# Patient Record
Sex: Female | Born: 1955
Health system: Southern US, Community
[De-identification: ages and names within clinical notes are randomized; demographics above are authoritative.]

## PROBLEM LIST (undated history)

## (undated) DIAGNOSIS — I10 Essential (primary) hypertension: Secondary | ICD-10-CM

## (undated) DIAGNOSIS — B029 Zoster without complications: Secondary | ICD-10-CM

## (undated) DIAGNOSIS — C4492 Squamous cell carcinoma of skin, unspecified: Secondary | ICD-10-CM

## (undated) DIAGNOSIS — Z87891 Personal history of nicotine dependence: Secondary | ICD-10-CM

## (undated) DIAGNOSIS — N809 Endometriosis, unspecified: Secondary | ICD-10-CM

## (undated) DIAGNOSIS — J029 Acute pharyngitis, unspecified: Secondary | ICD-10-CM

## (undated) DIAGNOSIS — M542 Cervicalgia: Secondary | ICD-10-CM

## (undated) DIAGNOSIS — Z862 Personal history of diseases of the blood and blood-forming organs and certain disorders involving the immune mechanism: Secondary | ICD-10-CM

## (undated) DIAGNOSIS — Z972 Presence of dental prosthetic device (complete) (partial): Secondary | ICD-10-CM

## (undated) DIAGNOSIS — M26609 Unspecified temporomandibular joint disorder, unspecified side: Secondary | ICD-10-CM

## (undated) DIAGNOSIS — G43909 Migraine, unspecified, not intractable, without status migrainosus: Secondary | ICD-10-CM

## (undated) DIAGNOSIS — R209 Unspecified disturbances of skin sensation: Secondary | ICD-10-CM

## (undated) DIAGNOSIS — J019 Acute sinusitis, unspecified: Secondary | ICD-10-CM

## (undated) HISTORY — DX: Unspecified temporomandibular joint disorder, unspecified side: M26.609

## (undated) HISTORY — DX: Cervicalgia: M54.2

## (undated) HISTORY — DX: Acute sinusitis, unspecified: J01.90

## (undated) HISTORY — PX: HERNIA REPAIR: SHX51

## (undated) HISTORY — PX: WISDOM TOOTH EXTRACTION: SHX21

## (undated) HISTORY — DX: Zoster without complications: B02.9

## (undated) HISTORY — DX: Acute pharyngitis, unspecified: J02.9

## (undated) HISTORY — PX: DILATION AND CURETTAGE OF UTERUS: SHX78

## (undated) HISTORY — DX: Personal history of nicotine dependence: Z87.891

## (undated) HISTORY — DX: Unspecified disturbances of skin sensation: R20.9

---

## 2007-10-22 ENCOUNTER — Emergency Department (HOSPITAL_COMMUNITY): Admission: EM | Admit: 2007-10-22 | Discharge: 2007-10-22 | Payer: Self-pay | Admitting: Emergency Medicine

## 2008-02-14 LAB — CONVERTED CEMR LAB: Pap Smear: NORMAL

## 2008-03-15 ENCOUNTER — Emergency Department (HOSPITAL_COMMUNITY): Admission: EM | Admit: 2008-03-15 | Discharge: 2008-03-15 | Payer: Self-pay | Admitting: Emergency Medicine

## 2008-07-28 ENCOUNTER — Emergency Department (HOSPITAL_COMMUNITY): Admission: EM | Admit: 2008-07-28 | Discharge: 2008-07-29 | Payer: Self-pay | Admitting: Emergency Medicine

## 2008-09-06 ENCOUNTER — Emergency Department (HOSPITAL_COMMUNITY): Admission: EM | Admit: 2008-09-06 | Discharge: 2008-09-06 | Payer: Self-pay | Admitting: Emergency Medicine

## 2008-10-15 ENCOUNTER — Emergency Department (HOSPITAL_COMMUNITY): Admission: EM | Admit: 2008-10-15 | Discharge: 2008-10-15 | Payer: Self-pay | Admitting: Emergency Medicine

## 2008-10-18 ENCOUNTER — Ambulatory Visit: Payer: Self-pay | Admitting: Family Medicine

## 2008-10-18 DIAGNOSIS — B029 Zoster without complications: Secondary | ICD-10-CM

## 2008-10-18 HISTORY — DX: Zoster without complications: B02.9

## 2008-10-29 ENCOUNTER — Ambulatory Visit: Payer: Self-pay | Admitting: Family Medicine

## 2008-10-29 LAB — CONVERTED CEMR LAB
Ketones, urine, test strip: NEGATIVE
Nitrite: NEGATIVE
Specific Gravity, Urine: 1.02

## 2008-10-30 LAB — CONVERTED CEMR LAB
Albumin: 3.7 g/dL (ref 3.5–5.2)
Basophils Absolute: 0 10*3/uL (ref 0.0–0.1)
CO2: 31 meq/L (ref 19–32)
Calcium: 9.3 mg/dL (ref 8.4–10.5)
Chloride: 109 meq/L (ref 96–112)
Eosinophils Absolute: 0.2 10*3/uL (ref 0.0–0.7)
Glucose, Bld: 90 mg/dL (ref 70–99)
HCT: 44.2 % (ref 36.0–46.0)
Hemoglobin: 15.2 g/dL — ABNORMAL HIGH (ref 12.0–15.0)
Lymphs Abs: 1.7 10*3/uL (ref 0.7–4.0)
MCHC: 34.3 g/dL (ref 30.0–36.0)
MCV: 90.2 fL (ref 78.0–100.0)
Monocytes Absolute: 0.5 10*3/uL (ref 0.1–1.0)
Neutro Abs: 4.9 10*3/uL (ref 1.4–7.7)
Potassium: 4.2 meq/L (ref 3.5–5.1)
RDW: 12.3 % (ref 11.5–14.6)
Sodium: 144 meq/L (ref 135–145)
TSH: 1.81 microintl units/mL (ref 0.35–5.50)
Total Protein: 6.8 g/dL (ref 6.0–8.3)
Triglycerides: 296 mg/dL — ABNORMAL HIGH (ref 0.0–149.0)

## 2008-11-01 ENCOUNTER — Ambulatory Visit: Payer: Self-pay | Admitting: Family Medicine

## 2008-11-08 ENCOUNTER — Encounter: Payer: Self-pay | Admitting: Gastroenterology

## 2009-02-11 DIAGNOSIS — M26609 Unspecified temporomandibular joint disorder, unspecified side: Secondary | ICD-10-CM | POA: Insufficient documentation

## 2009-02-11 HISTORY — DX: Unspecified temporomandibular joint disorder, unspecified side: M26.609

## 2009-02-12 ENCOUNTER — Telehealth: Payer: Self-pay | Admitting: Internal Medicine

## 2009-02-12 ENCOUNTER — Ambulatory Visit: Payer: Self-pay | Admitting: Family Medicine

## 2009-02-12 DIAGNOSIS — J029 Acute pharyngitis, unspecified: Secondary | ICD-10-CM | POA: Insufficient documentation

## 2009-02-12 HISTORY — DX: Acute pharyngitis, unspecified: J02.9

## 2009-02-15 ENCOUNTER — Ambulatory Visit: Payer: Self-pay | Admitting: Family Medicine

## 2009-02-15 DIAGNOSIS — M542 Cervicalgia: Secondary | ICD-10-CM

## 2009-02-15 HISTORY — DX: Cervicalgia: M54.2

## 2009-04-15 ENCOUNTER — Telehealth: Payer: Self-pay | Admitting: Family Medicine

## 2009-05-03 ENCOUNTER — Ambulatory Visit: Payer: Self-pay | Admitting: Family Medicine

## 2009-05-03 DIAGNOSIS — J019 Acute sinusitis, unspecified: Secondary | ICD-10-CM

## 2009-05-03 HISTORY — DX: Acute sinusitis, unspecified: J01.90

## 2009-08-28 ENCOUNTER — Ambulatory Visit: Payer: Self-pay | Admitting: Family Medicine

## 2009-10-27 ENCOUNTER — Emergency Department (HOSPITAL_COMMUNITY): Admission: EM | Admit: 2009-10-27 | Discharge: 2009-10-27 | Payer: Self-pay | Admitting: Emergency Medicine

## 2009-10-30 ENCOUNTER — Ambulatory Visit: Payer: Self-pay | Admitting: Family Medicine

## 2009-10-30 DIAGNOSIS — R209 Unspecified disturbances of skin sensation: Secondary | ICD-10-CM

## 2009-10-30 HISTORY — DX: Unspecified disturbances of skin sensation: R20.9

## 2010-03-10 ENCOUNTER — Ambulatory Visit: Payer: Self-pay | Admitting: Family Medicine

## 2010-03-10 ENCOUNTER — Encounter: Payer: Self-pay | Admitting: Family Medicine

## 2010-03-10 DIAGNOSIS — Z87891 Personal history of nicotine dependence: Secondary | ICD-10-CM | POA: Insufficient documentation

## 2010-03-10 HISTORY — DX: Personal history of nicotine dependence: Z87.891

## 2010-03-26 ENCOUNTER — Ambulatory Visit: Payer: Self-pay | Admitting: Family Medicine

## 2010-06-20 ENCOUNTER — Other Ambulatory Visit: Payer: Self-pay | Admitting: Family Medicine

## 2010-06-20 ENCOUNTER — Ambulatory Visit
Admission: RE | Admit: 2010-06-20 | Discharge: 2010-06-20 | Payer: Self-pay | Source: Home / Self Care | Attending: Family Medicine | Admitting: Family Medicine

## 2010-06-20 LAB — BASIC METABOLIC PANEL
BUN: 10 mg/dL (ref 6–23)
CO2: 29 mEq/L (ref 19–32)
Calcium: 9.2 mg/dL (ref 8.4–10.5)
Chloride: 105 mEq/L (ref 96–112)
Creatinine, Ser: 0.8 mg/dL (ref 0.4–1.2)
GFR: 85.9 mL/min (ref >60.00)
Glucose, Bld: 85 mg/dL (ref 70–99)
Potassium: 4.4 mEq/L (ref 3.5–5.1)
Sodium: 141 mEq/L (ref 135–145)

## 2010-06-20 LAB — CONVERTED CEMR LAB
Bilirubin Urine: NEGATIVE
Glucose, Urine, Semiquant: NEGATIVE
pH: 7

## 2010-06-20 LAB — CBC WITH DIFFERENTIAL/PLATELET
Basophils Absolute: 0 10*3/uL (ref 0.0–0.1)
Basophils Relative: 0.6 % (ref 0.0–3.0)
Eosinophils Absolute: 0.2 10*3/uL (ref 0.0–0.7)
Eosinophils Relative: 3 % (ref 0.0–5.0)
HCT: 45.9 % (ref 36.0–46.0)
Hemoglobin: 15.3 g/dL — ABNORMAL HIGH (ref 12.0–15.0)
Lymphocytes Relative: 21.9 % (ref 12.0–46.0)
Lymphs Abs: 1.8 10*3/uL (ref 0.7–4.0)
MCHC: 33.3 g/dL (ref 30.0–36.0)
MCV: 88.9 fl (ref 78.0–100.0)
Monocytes Absolute: 0.6 10*3/uL (ref 0.1–1.0)
Monocytes Relative: 7.1 % (ref 3.0–12.0)
Neutro Abs: 5.5 10*3/uL (ref 1.4–7.7)
Neutrophils Relative %: 67.4 % (ref 43.0–77.0)
Platelets: 241 10*3/uL (ref 150.0–400.0)
RBC: 5.16 Mil/uL — ABNORMAL HIGH (ref 3.87–5.11)
RDW: 13.5 % (ref 11.5–14.6)
WBC: 8.2 10*3/uL (ref 4.5–10.5)

## 2010-06-20 LAB — HEPATIC FUNCTION PANEL
ALT: 11 U/L (ref 0–35)
AST: 17 U/L (ref 0–37)
Albumin: 3.7 g/dL (ref 3.5–5.2)
Alkaline Phosphatase: 85 U/L (ref 39–117)
Bilirubin, Direct: 0.1 mg/dL (ref 0.0–0.3)
Total Bilirubin: 0.7 mg/dL (ref 0.3–1.2)
Total Protein: 6.6 g/dL (ref 6.0–8.3)

## 2010-06-20 LAB — TSH: TSH: 2.03 u[IU]/mL (ref 0.35–5.50)

## 2010-06-20 LAB — LIPID PANEL
Cholesterol: 205 mg/dL — ABNORMAL HIGH (ref 0–200)
HDL: 27.6 mg/dL — ABNORMAL LOW (ref >39.00)
Total CHOL/HDL Ratio: 7
Triglycerides: 184 mg/dL — ABNORMAL HIGH (ref 0.0–149.0)
VLDL: 36.8 mg/dL (ref 0.0–40.0)

## 2010-06-20 LAB — LDL CHOLESTEROL, DIRECT: Direct LDL: 147.9 mg/dL

## 2010-07-02 ENCOUNTER — Ambulatory Visit
Admission: RE | Admit: 2010-07-02 | Discharge: 2010-07-02 | Payer: Self-pay | Source: Home / Self Care | Attending: Family Medicine | Admitting: Family Medicine

## 2010-07-15 NOTE — Assessment & Plan Note (Signed)
Summary: ST, SECONDARY L FOOT PAIN // RS   Vital Signs:  Patient profile:   55 year old female Weight:      132 pounds Temp:     98.7 degrees F oral BP sitting:   100 / 80  (left arm) Cuff size:   regular  Vitals Entered By: Sid Falcon LPN (March 10, 2010 10:13 AM)  History of Present Illness: Patient here for the following issues  New issue of left great toe pain. This past Saturday she was walking through her garage and slipped and foot went underneath the refrigerator. She had some bruising and mild swelling mostly involving the great toe. No problems with ambulation. No ankle or leg pain.  Patient relates sudden onset this past Thursday around 11:15 AM of chest pain very localized left lower sternal border. Took some Prilosec without relief. No recent GERD symptoms. Pain is mild to moderate severity and constant. Denies any dyspnea, cough, fever, chills, hemoptysis, or pleuritic pain. No alleviating factors. No exacerbating factors. Not sore to touch.  No exertional symptoms.  Allergies: 1)  ! Gnp Iodine (Iodine)  Past History:  Past Medical History: Last updated: 10/18/2008 Anemia Frequent headaches  Family History: Last updated: 10/18/2008 Family History of Alcoholism/Addiction parent Family History of Arthritis parent Ovary cancer, mother Family History High cholesterol parent Diabetes, blood relative Family History of Cardiovascular disorder parent Stroke parent  Social History: Last updated: 10/18/2008 Occupation:  Manufacturing systems engineer Current Smoker Alcohol use-no  Risk Factors: Smoking Status: current (08/28/2009) PMH-FH-SH reviewed for relevance  Review of Systems  The patient denies anorexia, fever, weight loss, hoarseness, syncope, dyspnea on exertion, peripheral edema, prolonged cough, hemoptysis, abdominal pain, and severe indigestion/heartburn.    Physical Exam  General:  Well-developed,well-nourished,in no acute distress; alert,appropriate  and cooperative throughout examination Mouth:  Oral mucosa and oropharynx without lesions or exudates.  Teeth in good repair. Neck:  No deformities, masses, or tenderness noted. Chest Wall:  no reproducible tenderness to palpation. Lungs:  Normal respiratory effort, chest expands symmetrically. Lungs are clear to auscultation, no crackles or wheezes. Heart:  Normal rate and regular rhythm. S1 and S2 normal without gallop, murmur, click, rub or other extra sounds. Abdomen:  soft, non-tender, no distention, no masses, no guarding, no rigidity, no hepatomegaly, and no splenomegaly.   Extremities:  patient has ecchymosis left great toe just proximal to the nail. There is tenderness of the distal toe but no metatarsal tenderness. Skin:  no rashes.   Cervical Nodes:  No lymphadenopathy noted Psych:  normally interactive, good eye contact, not anxious appearing, and not depressed appearing.     Impression & Recommendations:  Problem # 1:  CHEST PAIN, ATYPICAL (ICD-786.59) Assessment New EKG no acute changes.  Atypical symptoms.  Observe for now and be in touch if symptoms persist or worsen. Orders: EKG w/ Interpretation (93000)  Problem # 2:  CONTUSION OF TOE (ICD-924.3) Assessment: New offered x-ray but explained if fx would not  change treatment. She will focus on more stiff-soled shoe.  Problem # 3:  PERS HX TOBACCO USE PRESENTING HAZARDS HEALTH (ICD-V15.82)  discussed smoking cessation but current motivation if fairly low.  3 minutes spent in counseling.  Orders: Tobacco use cessation intermediate 3-10 minutes (99406)  Complete Medication List: 1)  Imitrex 100 Mg Tabs (Sumatriptan succinate) .... 1/2 tab as needed  Patient Instructions: 1)  Use stiff sole shoe for toe comfort. 2)  Follow up promptly for any progressive or continued chest pain.

## 2010-07-15 NOTE — Assessment & Plan Note (Signed)
Summary: L LEG NUMBNESS (POST EVAL FROM [facility]) // RS   Vital Signs:  Patient profile:   55 year old female BP sitting:   122 / 82  (left arm) Cuff size:   regular  Vitals Entered By: Sid Falcon LPN (Oct 30, 2009 11:02 AM) CC: left leg numbness X 4 days   History of Present Illness: onset of numbness L leg Saturday.  No weakness.  No back pain or leg pain. Numbness involves lower 1/3 of leg and is diffuse.  Symptoms are continuous.  No exacerbating or alleviating factors. No upper extrem numbness.  No headaches. Frequntly sits with LLE crossed under her body.  Went to ER saturday.  No tests done.  Allergies: 1)  ! Gnp Iodine (Iodine)  Past History:  Past Medical History: Last updated: 10/18/2008 Anemia Frequent headaches  Family History: Last updated: 10/18/2008 Family History of Alcoholism/Addiction parent Family History of Arthritis parent Ovary cancer, mother Family History High cholesterol parent Diabetes, blood relative Family History of Cardiovascular disorder parent Stroke parent  Social History: Last updated: 10/18/2008 Occupation:  Manufacturing systems engineer Current Smoker Alcohol use-no PMH-FH-SH reviewed for relevance  Review of Systems  The patient denies anorexia, fever, weight loss, vision loss, chest pain, syncope, dyspnea on exertion, peripheral edema, headaches, abdominal pain, incontinence, muscle weakness, difficulty walking, and enlarged lymph nodes.    Physical Exam  General:  Well-developed,well-nourished,in no acute distress; alert,appropriate and cooperative throughout examination Eyes:  pupils equal, pupils round, and pupils reactive to light.   Neck:  no mass.  no bruits. Lungs:  Normal respiratory effort, chest expands symmetrically. Lungs are clear to auscultation, no crackles or wheezes. Heart:  normal rate, regular rhythm, and no gallop.   Extremities:  no edema.  Normal DP pulses.  Feet warm with good cap refill. Neurologic:  Full  strength plantar and dorsi flexion.   alert & oriented X3, cranial nerves II-XII intact, strength normal in all extremities, sensation intact to light touch, gait normal, DTRs symmetrical and normal, toes down bilaterally on Babinski, and Romberg negative.   Skin:  no rashes.     Impression & Recommendations:  Problem # 1:  PARESTHESIA (ICD-782.0) Assessment New Confined to leg.  ?peripheral nerve compression.  No other focal neurologic sxs.  Avoid compression on leg and consider nerve conduction studies if no resolution in 1-2 weeks.  Complete Medication List: 1)  Imitrex 100 Mg Tabs (Sumatriptan succinate) .... 1/2 tab as needed  Patient Instructions: 1)  Call to touch base in 2 weeks if numbness is not resolving. 2)  Call sooner if you have any back pain, weakness, or any progressive numbness.

## 2010-07-15 NOTE — Assessment & Plan Note (Signed)
Summary: SINUSES//CCM   Vital Signs:  Patient profile:   55 year old female Temp:     97.8 degrees F oral BP sitting:   102 / 72  (left arm) Cuff size:   regular  Vitals Entered By: Sid Falcon LPN (August 28, 2009 8:48 AM) CC: Sinusitis symptoms X 2-3 days   History of Present Illness: acute visit. One week history of increasing facial pain mostly maxillary sinus region. Intermittent mild headaches. Yellow-green nasal discharge. No fever. Intermittent sore throat. No cough. History of frequent sinusitis in the past. Tried DayQuil without improvement.  Preventive Screening-Counseling & Management  Alcohol-Tobacco     Smoking Status: current  Allergies: 1)  ! Gnp Iodine (Iodine)  Review of Systems      See HPI  Physical Exam  General:  Well-developed,well-nourished,in no acute distress; alert,appropriate and cooperative throughout examination Head:  Normocephalic and atraumatic without obvious abnormalities. No apparent alopecia or balding. Eyes:  pupils equal, pupils round, and pupils reactive to light.   Ears:  right ear canal impacted with cerumen left is clear Nose:  External nasal examination shows no deformity or inflammation. Nasal mucosa are pink and moist without lesions or exudates. Mouth:  Oral mucosa and oropharynx without lesions or exudates.  Teeth in good repair. Neck:  No deformities, masses, or tenderness noted. Lungs:  Normal respiratory effort, chest expands symmetrically. Lungs are clear to auscultation, no crackles or wheezes. Heart:  normal rate and regular rhythm.     Impression & Recommendations:  Problem # 1:  SINUSITIS, ACUTE (ICD-461.9)  The following medications were removed from the medication list:    Amoxicillin-pot Clavulanate 875-125 Mg Tabs (Amoxicillin-pot clavulanate) ..... One by mouth two times a day for 10 days    Hydrocodone-homatropine 5-1.5 Mg/67ml Syrp (Hydrocodone-homatropine) ..... One tsp by mouth q 4-6 hours as needed  cough Her updated medication list for this problem includes:    Amoxicillin 875 Mg Tabs (Amoxicillin) ..... One by mouth two times a day for 10 days  Complete Medication List: 1)  Imitrex 100 Mg Tabs (Sumatriptan succinate) .... 1/2 tab as needed 2)  Amoxicillin 875 Mg Tabs (Amoxicillin) .... One by mouth two times a day for 10 days  Patient Instructions: 1)  Acute sinusitis symptoms for less than 10 days are not helped by antibiotics. Use warm moist compresses, and over the counter decongestants( only as directed). Call if no improvement in 5-7 days, sooner if increasing pain, fever, or new symptoms.  Prescriptions: AMOXICILLIN 875 MG TABS (AMOXICILLIN) one by mouth two times a day for 10 days  #20 x 0   Entered and Authorized by:   [name] MD   Signed by:   [name] MD on 08/28/2009   Method used:   Electronically to        [facility] [phone]* (retail)       [address]       [place], [place]  [address]       Ph: [phone] or [phone]       Fax: [phone]   RxID:   [id number]

## 2010-07-15 NOTE — Assessment & Plan Note (Signed)
Summary: face swollen/njr   Vital Signs:  Patient profile:   55 year old female Weight:      133 pounds Temp:     98.1 degrees F oral BP sitting:   100 / 70  (left arm) Cuff size:   regular  Vitals Entered By: Sid Falcon LPN (March 26, 2010 11:51 AM)  History of Present Illness: Acute onset of right maxillary facial swelling yesterday. She denies any fever or chills. No significant sinus congestion. She has had frequent sinusitis the past. No facial weakness. Recently started by dentist on amoxicillin 500 mg twice daily but just started this 2 days ago. She does have a history of dental caries but no recent gum pain. Denies any cough. No visual symptoms.  Allergies: 1)  ! Gnp Iodine (Iodine)  Past History:  Past Medical History: Last updated: 10/18/2008 Anemia Frequent headaches  Review of Systems      See HPI  Physical Exam  General:  Well-developed,well-nourished,in no acute distress; alert,appropriate and cooperative throughout examination Head:  no evidence for nasolacrimal swelling on left or right Has some mild swelling over R max sinus region but no erythema or cellulitis changes.   Mildy tender to palpation. Eyes:  pupils equal, pupils round, and pupils reactive to light.   Ears:  minimal cerumen bilaterally otherwise unremarkable Nose:  External nasal examination shows no deformity or inflammation. Nasal mucosa are pink and moist without lesions or exudates. Mouth:  she has dental caries but no evidence for gum abscess Neck:  No deformities, masses, or tenderness noted. Lungs:  Normal respiratory effort, chest expands symmetrically. Lungs are clear to auscultation, no crackles or wheezes.   Impression & Recommendations:  Problem # 1:  SINUSITIS, ACUTE (ICD-461.9) Assessment New  Her updated medication list for this problem includes:    Amoxicillin-pot Clavulanate 875-125 Mg Tabs (Amoxicillin-pot clavulanate) ..... One by mouth two times a day for 10  days  Complete Medication List: 1)  Imitrex 100 Mg Tabs (Sumatriptan succinate) .... 1/2 tab as needed 2)  Amoxicillin-pot Clavulanate 875-125 Mg Tabs (Amoxicillin-pot clavulanate) .... One by mouth two times a day for 10 days  Patient Instructions: 1)  Consider addition of Mucinex 2 tablets twice daily 2)  Warm compresses to face several times daily Prescriptions: AMOXICILLIN-POT CLAVULANATE 875-125 MG TABS (AMOXICILLIN-POT CLAVULANATE) one by mouth two times a day for 10 days  #20 x 0   Entered and Authorized by:   [name] MD   Signed by:   [name] MD on 03/26/2010   Method used:   [facility]ically to        [facility] [phone][facility] (retail)       [address]       [place], [place]  [address]       Ph: [phone] or [phone]       Fax: [phone]   RxID:   [id number]

## 2010-07-17 NOTE — Assessment & Plan Note (Signed)
Summary: cpx/pap/cjr   Vital Signs:  Patient profile:   55 year old female Weight:      131 pounds BMI:     22.57 Temp:     97.8 degrees F oral BP sitting:   110 / 80  (right arm) Cuff size:   regular  Vitals Entered By: Duard Brady LPN (July 02, 2010 11:28 AM) CC: cpx - doing well Is Patient Diabetic? No   History of Present Illness: Here for CPE.    Still smoking and contemplating quitting but not willing to  commit yet. Tetanus up to date.  No hx of PVX.   Sees gyn for pap and mammogram. No hx of screening colonoscopy.  No regular exercise.  Clinical Review Panels:  Prevention   Last Mammogram:  normal (09/14/2006)   Last Pap Smear:  normal (02/14/2008)  Immunizations   Last Tetanus Booster:  Historical (09/13/2008)   Last Pneumovax:  Pneumovax (07/02/2010)  Lipid Management   Cholesterol:  205 (06/20/2010)   HDL (good cholesterol):  27.60 (06/20/2010)  Diabetes Management   Creatinine:  0.8 (06/20/2010)   Last Pneumovax:  Pneumovax (07/02/2010)  CBC   WBC:  8.2 (06/20/2010)   RBC:  5.16 (06/20/2010)   Hgb:  15.3 (06/20/2010)   Hct:  45.9 (06/20/2010)   Platelets:  241.0 (06/20/2010)   MCV  88.9 (06/20/2010)   MCHC  33.3 (06/20/2010)   RDW  13.5 (06/20/2010)   PMN:  67.4 (06/20/2010)   Lymphs:  21.9 (06/20/2010)   Monos:  7.1 (06/20/2010)   Eosinophils:  3.0 (06/20/2010)   Basophil:  0.6 (06/20/2010)  Complete Metabolic Panel   Glucose:  85 (06/20/2010)   Sodium:  141 (06/20/2010)   Potassium:  4.4 (06/20/2010)   Chloride:  105 (06/20/2010)   CO2:  29 (06/20/2010)   BUN:  10 (06/20/2010)   Creatinine:  0.8 (06/20/2010)   Albumin:  3.7 (06/20/2010)   Total Protein:  6.6 (06/20/2010)   Calcium:  9.2 (06/20/2010)   Total Bili:  0.7 (06/20/2010)   Alk Phos:  85 (06/20/2010)   SGPT (ALT):  11 (06/20/2010)   SGOT (AST):  17 (06/20/2010)   Allergies: 1)  ! Gnp Iodine (Iodine)  Past History:  Past Medical History: Last updated:  10/18/2008 Anemia Frequent headaches  Family History: Last updated: 07/02/2010 Family History of Alcoholism/Addiction parent Family History of Arthritis parent Esophageal cancer, mother Family History High cholesterol parent Stroke parent Father colon cancer  Social History: Last updated: 10/18/2008 Occupation:  Manufacturing systems engineer Current Smoker Alcohol use-no  Risk Factors: Smoking Status: current (08/28/2009) PMH-FH-SH reviewed for relevance  Family History: Family History of Alcoholism/Addiction parent Family History of Arthritis parent Esophageal cancer, mother Family History High cholesterol parent Stroke parent Father colon cancer  Review of Systems  The patient denies anorexia, fever, weight loss, vision loss, decreased hearing, hoarseness, chest pain, syncope, dyspnea on exertion, peripheral edema, prolonged cough, headaches, hemoptysis, abdominal pain, melena, hematochezia, severe indigestion/heartburn, hematuria, incontinence, genital sores, muscle weakness, suspicious skin lesions, transient blindness, difficulty walking, depression, unusual weight change, abnormal bleeding, enlarged lymph nodes, and breast masses.    Physical Exam  General:  Well-developed,well-nourished,in no acute distress; alert,appropriate and cooperative throughout examination Head:  normocephalic and atraumatic.   Eyes:  No corneal or conjunctival inflammation noted. EOMI. Perrla. Funduscopic exam benign, without hemorrhages, exudates or papilledema. Vision grossly normal. Ears:  mild cerumen bilateral. Mouth:  Oral mucosa and oropharynx without lesions or exudates.  Teeth in good repair. Neck:  No deformities,  masses, or tenderness noted. Breasts:  gyn Lungs:  Normal respiratory effort, chest expands symmetrically. Lungs are clear to auscultation, no crackles or wheezes. Heart:  Normal rate and regular rhythm. S1 and S2 normal without gallop, murmur, click, rub or other extra  sounds. Abdomen:  Bowel sounds positive,abdomen soft and non-tender without masses, organomegaly or hernias noted. Genitalia:  gyn Msk:  No deformity or scoliosis noted of thoracic or lumbar spine.   Extremities:  No clubbing, cyanosis, edema, or deformity noted with normal full range of motion of all joints.   Neurologic:  No cranial nerve deficits noted. Station and gait are normal. Plantar reflexes are down-going bilaterally. DTRs are symmetrical throughout. Sensory, motor and coordinative functions appear intact. Skin:  no rashes and no suspicious lesions.   Cervical Nodes:  No lymphadenopathy noted Psych:  Cognition and judgment appear intact. Alert and cooperative with normal attention span and concentration. No apparent delusions, illusions, hallucinations   Impression & Recommendations:  Problem # 1:  Preventive Health Care (ICD-V70.0) discussed smoking cessation.  PVX recommended and pt consents with RF of smoking. Labs reviewed.  Set up colonoscopy.  Discussed osteoporosis prevention.  Problem # 2:  PERS HX TOBACCO USE PRESENTING HAZARDS HEALTH (ICD-V15.82) discussed cessation and available nonrx and rx options.  She is not willing to commit at this time. Orders: Tobacco use cessation intermediate 3-10 minutes (99406)  Complete Medication List: 1)  Imitrex 100 Mg Tabs (Sumatriptan succinate) .... 1/2 tab as needed  Other Orders: Gastroenterology Referral (GI) Pneumococcal Vaccine (16109) Admin 1st Vaccine (60454)  Patient Instructions: 1)  Stop smoking tips: Choose a quit date. Cut down before the quit date. Decide what you will do as a substitute when you feel the urge to smoke(gum, toothpick, exercise).  2)  Schedule a colonoscopy/ sigmoidoscopy to help detect colon cancer-we will set up and call you with appt. 3)  Take calcium +vitamin D daily.    Orders Added: 1)  Gastroenterology Referral [GI] 2)  Est. Patient 40-64 years [99396] 3)  Pneumococcal Vaccine  [90732] 4)  Admin 1st Vaccine [90471] 5)  Tobacco use cessation intermediate 3-10 minutes [99406]   Immunizations Administered:  Pneumonia Vaccine:    Vaccine Type: Pneumovax    Site: left deltoid    Mfr: Merck    Dose: 0.5 ml    Route: IM    Given by: Duard Brady LPN    Exp. Date: 10/15/2011    Lot #: [id number]    VIS given: 05/20/09 version given July 02, 2010.    Physician counseled: yes   Immunizations Administered:  Pneumonia Vaccine:    Vaccine Type: Pneumovax    Site: left deltoid    Mfr: Merck    Dose: 0.5 ml    Route: IM    Given by: Duard Brady LPN    Exp. Date: 10/15/2011    Lot #: [id number]    VIS given: 05/20/09 version given July 02, 2010.    Physician counseled: yes

## 2011-01-18 ENCOUNTER — Other Ambulatory Visit: Payer: Self-pay | Admitting: Family Medicine

## 2011-02-20 ENCOUNTER — Ambulatory Visit (INDEPENDENT_AMBULATORY_CARE_PROVIDER_SITE_OTHER): Payer: Self-pay | Admitting: Family Medicine

## 2011-02-20 ENCOUNTER — Encounter: Payer: Self-pay | Admitting: Family Medicine

## 2011-02-20 VITALS — BP 108/70 | Temp 98.4°F | Ht 62.0 in | Wt 127.0 lb

## 2011-02-20 DIAGNOSIS — J329 Chronic sinusitis, unspecified: Secondary | ICD-10-CM

## 2011-02-20 MED ORDER — AMOXICILLIN 875 MG PO TABS: 875.0000 mg | ORAL_TABLET | Freq: Two times a day (BID) | ORAL | Status: AC

## 2011-02-20 NOTE — Progress Notes (Signed)
  Subjective:    Patient ID: Ann Lee, female    DOB: [birth year], 55 y.o.   MRN: [id number]  HPI Progressive sinus congestion and some colored nasal discharge past several days. Facial pain mostly left cheek region. History of frequent sinusitis the past. Rare cough. No fever or chills. Has generally responded well to amoxicillin in the past. Denies any earache. No nausea or vomiting.  Recent rash about one week ago right anterior chest medial and inferior to breast. Patient diagnosis is shingles. Drying up to this point. Minimal pain.   Review of Systems  Constitutional: Positive for fatigue. Negative for fever and chills.  HENT: Positive for congestion, postnasal drip and sinus pressure. Negative for sore throat.   Respiratory: Negative for cough and shortness of breath.   Cardiovascular: Negative for chest pain.  Skin: Positive for rash.       Objective:   Physical Exam  Constitutional: She appears well-developed and well-nourished.  HENT:  Right Ear: External ear normal.  Left Ear: External ear normal.       Erythematous nasal mucosa. Otherwise normal  Neck: Neck supple.  Cardiovascular: Normal rate, regular rhythm and normal heart sounds.   Pulmonary/Chest: Effort normal and breath sounds normal. No respiratory distress. She has no wheezes. She has no rales.  Lymphadenopathy:    She has no cervical adenopathy.  Skin:       Patient has a couple nonspecific small crusted papules medial right upper chest wall          Assessment & Plan:  #1 acute sinusitis. Amoxicillin 875 mg twice daily for 10 days #2 skin rash. Question resolving shingles. Reassurance given. No indication for antiviral as this started over one week ago

## 2011-02-20 NOTE — Patient Instructions (Signed)

## 2011-03-17 LAB — COMPREHENSIVE METABOLIC PANEL
AST: 17
Albumin: 3.6
BUN: 8
Chloride: 106
Creatinine, Ser: 0.8
GFR calc Af Amer: >60
Potassium: 4
Total Bilirubin: 0.6
Total Protein: 6.1

## 2011-03-17 LAB — CBC
MCV: 89.5
Platelets: 195
RDW: 13.3
WBC: 9.9

## 2011-03-17 LAB — DIFFERENTIAL
Basophils Absolute: 0
Eosinophils Relative: 2
Lymphocytes Relative: 7 — ABNORMAL LOW
Lymphs Abs: 0.7
Monocytes Absolute: 0.4
Monocytes Relative: 5
Neutro Abs: 8.6 — ABNORMAL HIGH

## 2011-06-24 ENCOUNTER — Ambulatory Visit (INDEPENDENT_AMBULATORY_CARE_PROVIDER_SITE_OTHER): Payer: Self-pay | Admitting: Family Medicine

## 2011-06-24 ENCOUNTER — Encounter: Payer: Self-pay | Admitting: Family Medicine

## 2011-06-24 VITALS — BP 140/84 | Temp 99.0°F | Wt 125.0 lb

## 2011-06-24 DIAGNOSIS — L258 Unspecified contact dermatitis due to other agents: Secondary | ICD-10-CM

## 2011-06-24 DIAGNOSIS — L853 Xerosis cutis: Secondary | ICD-10-CM

## 2011-06-24 DIAGNOSIS — L84 Corns and callosities: Secondary | ICD-10-CM

## 2011-06-24 MED ORDER — TRIAMCINOLONE ACETONIDE 0.1 % EX CREA: TOPICAL_CREAM | CUTANEOUS | Status: DC

## 2011-06-24 NOTE — Progress Notes (Signed)
  Subjective:    Patient ID: Ann Lee, female    DOB: [birth year], 56 y.o.   MRN: [id number]  HPI  Patient seen for the following issues  She has slightly pruritic rash mostly involving the lumbar and lower thoracic spine. She has very dry skin in this region and also forearms bilaterally. Has tried Benadryl spray and Lubriderm without much improvement. Itching especially bad at night. No alleviating factors. No aggravating factors.  Foot pain ventral aspect right foot on the ball of foot. She has callused area. Present for several months. Pain with weightbearing. No alleviating factors. No history of plantar warts.   Review of Systems  Constitutional: Negative for fever, chills, appetite change and unexpected weight change.  Respiratory: Negative for cough and shortness of breath.   Cardiovascular: Negative for chest pain.  Skin: Positive for rash.  Hematological: Negative for adenopathy.       Objective:   Physical Exam  Constitutional: She appears well-developed and well-nourished.  Cardiovascular: Normal rate and regular rhythm.   Pulmonary/Chest: Effort normal and breath sounds normal. No respiratory distress. She has no wheezes. She has no rales.  Musculoskeletal:       Patient has callus/corn ball right foot.  Skin:       Patient has generalized dryness involving especially forearms and lower back region in the midline. She has some pressure induced urticaria thoracic area from scratching. Couple nonspecific erythematous papules. No pustules. No vesicles.          Assessment & Plan:  #1 skin rash. Mostly dry skin. Triamcinolone 0.1% cream compounded with user and one-to-one and use twice daily. Discussed other measures to reduce dryness #2 corn/callus right foot. Trimmed with a #15 blade after discussing risk and benefits. She did have some immediate relief after trimming thickness of callus. She is aware this will likely recur

## 2011-07-03 ENCOUNTER — Telehealth: Payer: Self-pay | Admitting: Family Medicine

## 2011-07-03 MED ORDER — SUMATRIPTAN SUCCINATE 100 MG PO TABS: ORAL_TABLET | ORAL | Status: DC

## 2011-07-03 NOTE — Telephone Encounter (Signed)
Pt. Needs refill of SUMAtriptan called into pharmacy. Pharmacy told pt. Her prescription expired. Would like to have it called in asap due to holiday weekend. If this can't be done please call and let her know.

## 2011-10-12 ENCOUNTER — Ambulatory Visit (INDEPENDENT_AMBULATORY_CARE_PROVIDER_SITE_OTHER): Payer: BC Managed Care – PPO | Admitting: Family Medicine

## 2011-10-12 ENCOUNTER — Encounter: Payer: Self-pay | Admitting: Family Medicine

## 2011-10-12 VITALS — BP 130/84 | Temp 98.6°F | Wt 131.0 lb

## 2011-10-12 DIAGNOSIS — M542 Cervicalgia: Secondary | ICD-10-CM

## 2011-10-12 MED ORDER — CYCLOBENZAPRINE HCL 5 MG PO TABS: 5.0000 mg | ORAL_TABLET | Freq: Three times a day (TID) | ORAL | Status: AC | PRN

## 2011-10-12 NOTE — Progress Notes (Signed)
  Subjective:    Patient ID: Ann Lee, female    DOB: [birth year], 56 y.o.   MRN: [id number]  HPI  Acute visit. Onset 4 days ago of right-sided neck pain. Radiates right trapezius toward occiput. No injury. Pain is relatively constant but worse with the change of position. She tried Aleve and Advil without relief. He with temporary relief. Denies any numbness or weakness upper extremity. No radiculopathy symptoms. No history of similar process. Muscles feel tight.  Review of Systems  Constitutional: Negative for appetite change and unexpected weight change.  HENT: Positive for neck pain and neck stiffness (no meningismus).   Respiratory: Negative for cough and shortness of breath.   Cardiovascular: Negative for chest pain.       Objective:   Physical Exam  Constitutional: She appears well-developed and well-nourished.  Cardiovascular: Normal rate and regular rhythm.   Pulmonary/Chest: Effort normal and breath sounds normal. No respiratory distress. She has no wheezes. She has no rales.  Musculoskeletal:       Full range of motion neck. She has some right paracervical muscle tenderness and right trapezius muscle tenderness.  Neurological:       Full strength upper extremities. Symmetric reflexes. Normal sensory function.          Assessment & Plan:  Right-sided neck pain. Suspect muscular. Try Flexeril 5 mg each bedtime and titrate to 10 mg if no relief with one. Continue topical heat. Touch base next week if no better

## 2011-10-12 NOTE — Patient Instructions (Signed)
Continue with topical heat and be in touch if no better in one week.

## 2011-10-24 ENCOUNTER — Emergency Department (HOSPITAL_COMMUNITY)
Admission: EM | Admit: 2011-10-24 | Discharge: 2011-10-24 | Disposition: A | Discharge status: Home / Self Care | Payer: BC Managed Care – PPO | Attending: Emergency Medicine | Admitting: Emergency Medicine

## 2011-10-24 ENCOUNTER — Emergency Department (HOSPITAL_COMMUNITY): Payer: BC Managed Care – PPO

## 2011-10-24 ENCOUNTER — Encounter (HOSPITAL_COMMUNITY): Payer: Self-pay

## 2011-10-24 DIAGNOSIS — M79646 Pain in unspecified finger(s): Secondary | ICD-10-CM

## 2011-10-24 DIAGNOSIS — M79609 Pain in unspecified limb: Secondary | ICD-10-CM | POA: Insufficient documentation

## 2011-10-24 MED ORDER — IBUPROFEN 200 MG PO TABS
600.0000 mg | ORAL_TABLET | Freq: Once | ORAL | Status: AC
  Administered 2011-10-24: 600 mg via ORAL
  Filled 2011-10-24: qty 3

## 2011-10-24 MED ORDER — IBUPROFEN 600 MG PO TABS: 600.0000 mg | ORAL_TABLET | Freq: Three times a day (TID) | ORAL | Status: AC | PRN

## 2011-10-24 MED ORDER — HYDROCODONE-ACETAMINOPHEN 5-500 MG PO TABS: 1.0000 | ORAL_TABLET | Freq: Four times a day (QID) | ORAL | Status: AC | PRN

## 2011-10-24 NOTE — ED Notes (Signed)
Pt tore prescriptions from the paperwork that was given to her and refused to take them home.  Pt states "I don't want these prescriptions."  Pt left department ambulatory.

## 2011-10-24 NOTE — ED Notes (Signed)
Pt in from home with c/o right hand pain, redness, and swelling states radiates up  right arm states was sent in by PCP to r/o blood clot

## 2011-10-24 NOTE — ED Provider Notes (Signed)
History     CSN: [id number]  Arrival date & time 10/24/11  1204   First MD Initiated Contact with Patient 10/24/11 1222      Chief Complaint  Patient presents with  . Hand Pain    (Consider location/radiation/quality/duration/timing/severity/associated sxs/prior treatment) Patient is a 56 y.o. female presenting with hand pain. The history is provided by the patient.  Hand Pain  pt c/o sharp pain to right middle finger in past few days. States pain occasionally shoots to adjacent digits and proximally up finger towards hand. Worse w certain movements of middle finger and palpation finger. Denies recent injury, states crush injury to digits many years ago, but no chronic pain as a result. Denies arm pain or swelling. No fever or chills. No redness. No numbness/tingling. Normal rom. No hx carpal tunnel. No repetitive use injury.   Past Medical History  Diagnosis Date  . HERPES ZOSTER 10/18/2008  . SINUSITIS, ACUTE 05/03/2009  . SORE THROAT 02/12/2009  . TMJ SYNDROME 02/11/2009  . NECK PAIN 02/15/2009  . PARESTHESIA 10/30/2009  . PERS HX TOBACCO USE PRESENTING HAZARDS HEALTH 03/10/2010    History reviewed. No pertinent past surgical history.  Family History  Problem Relation Age of Onset  . Cancer Mother     esophageal  . Arthritis Mother   . Hyperlipidemia Father   . Arthritis Father   . Cancer Father     colon    History  Substance Use Topics  . Smoking status: Current Everyday Smoker -- 0.7 packs/day for 32 years    Types: Cigarettes  . Smokeless tobacco: Not on file  . Alcohol Use: No    OB History    Grav Para Term Preterm Abortions TAB SAB Ect Mult Living                  Review of Systems  Constitutional: Negative for fever and chills.  Skin: Negative for wound.  Neurological: Negative for weakness and numbness.    Allergies  Iodine and Shellfish allergy  Home Medications   Current Outpatient Rx  Name Route Sig Dispense Refill  . CYCLOBENZAPRINE HCL 5  MG PO TABS Oral Take 5 mg by mouth 3 (three) times daily as needed.    . SUMATRIPTAN SUCCINATE 100 MG PO TABS  Take 1/2 tab as needed for migraine headache 10 tablet 1    BP 128/78  Pulse 79  Temp(Src) 98.1 F (36.7 C) (Oral)  Resp 18  SpO2 98%  Physical Exam  Nursing note and vitals reviewed. Constitutional: She appears well-developed and well-nourished. No distress.  Eyes: Conjunctivae are normal. No scleral icterus.  Neck: Neck supple. No tracheal deviation present.  Cardiovascular: Normal rate.   Pulmonary/Chest: Effort normal. No respiratory distress.  Abdominal: Normal appearance. She exhibits no distension.  Musculoskeletal: She exhibits no edema.       Right middle finger very tender, although w some inconsistency. Pt able to actively and passively flex/extend finger comfortably. No swelling noted. Normal cap refill distally. Tendon fxn intact. No erythema, no wounds/lesions to skin. No paronychia or felon. No hand/arm or forearm swelling or tenderness. Radial pulse 2+.   Neurological: She is alert.       RUE/hand nvi.   Skin: Skin is warm and dry. No rash noted.  Psychiatric: She has a normal mood and affect.    ED Course  Procedures (including critical care time)     MDM  Pt c/o pain in right middle finger. Tenderness to area. Xray.  Reviewed prior charts/notes, and nursing notes.   Discussed diff dx w pt, as etiology not entirely apparent, will refer to close pcp and hand f/u.   Motrin po for pain (pt drove self).       Suzi Roots, MD 10/24/11 [phone]

## 2011-10-24 NOTE — Discharge Instructions (Signed)
Take motrin as need for pain.  You may also take vicodin as need for pain. No driving when taking vicodin. Also, do not take tylenol or acetaminophen containing medication when taking vicodin. Avoid repetitive use of or any trauma to the finger.  Follow up with hand specialist in the next few days if symptoms fail to improve/resolve - see referral - call Monday to arrange appointment. Return to ER if worse, severe swelling, spreading redness, fevers, skin rash/lesions, other concern.

## 2011-10-24 NOTE — ED Notes (Signed)
Denies recent injury to the right hand

## 2011-10-27 ENCOUNTER — Telehealth: Payer: Self-pay | Admitting: Family Medicine

## 2011-10-27 NOTE — Telephone Encounter (Signed)
[facility] Triage Call Report Triage Record Num: [id number] Operator: Hillary Bow Patient Name: Ann Lee Call Date & Time: 10/24/2011 9:57:25AM Patient Phone: [phone] PCP: [name] Patient Gender: Female PCP Fax : [phone] Patient DOB: Sep 21, 1955 [facility] Name[facility] Reason for Call: Caller: [place]kaylah/Patient; PCP: [name]; CB#: [phone]; Call regarding Right Ring Finger Swelling; Pt was seen for Neck Muscle Pain approx 1.5 weeks ago today Pt has sharp pain in R middle and ring Fingers w/ swelling, onset 5-11. Swelling is almost double in size. Advised Pt to be seen at [facility] or ED d/t new onset of painfull swelling worsen. [facility] office has no availablity on Sat remaining. Pt verbalized understanding. Protocol(s) Used: Hand Non-Injury Recommended Outcome per Protocol: See Provider within 4 hours Reason for Outcome: New painful tightness or marked swelling in palm, wrist, hand or fingers Care Advice: ~ 05/

## 2011-11-30 ENCOUNTER — Encounter: Payer: Self-pay | Admitting: Family Medicine

## 2011-11-30 ENCOUNTER — Ambulatory Visit (INDEPENDENT_AMBULATORY_CARE_PROVIDER_SITE_OTHER): Payer: BC Managed Care – PPO | Admitting: Family Medicine

## 2011-11-30 VITALS — BP 98/62 | Temp 102.3°F | Wt 127.0 lb

## 2011-11-30 DIAGNOSIS — J029 Acute pharyngitis, unspecified: Secondary | ICD-10-CM

## 2011-11-30 LAB — POCT RAPID STREP A (OFFICE): Rapid Strep A Screen: NEGATIVE

## 2011-11-30 MED ORDER — SUMATRIPTAN SUCCINATE 100 MG PO TABS: ORAL_TABLET | ORAL | Status: DC

## 2011-11-30 MED ORDER — AMOXICILLIN 875 MG PO TABS: 875.0000 mg | ORAL_TABLET | Freq: Two times a day (BID) | ORAL | Status: AC

## 2011-11-30 MED ORDER — AMOXICILLIN 875 MG PO TABS: 875.0000 mg | ORAL_TABLET | Freq: Two times a day (BID) | ORAL | Status: DC

## 2011-11-30 NOTE — Progress Notes (Signed)
  Subjective:    Patient ID: Ann Lee, female    DOB: 10/30/1955, 56 y.o.   MRN: [id number]  HPI  Acute visit. Onset this morning of fever, body aches, sore throat. She denies any nasal congestion or cough. No urinary symptoms. No vomiting or nausea. No diarrhea. No abdominal pain. 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 She denies any rash. No recent tick bites. Temperature 102.3 earlier today. Did come down somewhat Tylenol   Review of Systems  Constitutional: Positive for fever, chills and fatigue.  HENT: Positive for sore throat. Negative for congestion and sinus pressure.   Respiratory: Negative for cough.   Gastrointestinal: Negative for abdominal pain.  Genitourinary: Negative for dysuria.  Neurological: Negative for dizziness.       Objective:   Physical Exam  Constitutional: She appears well-developed and well-nourished.  HENT:  Right Ear: External ear normal.  Left Ear: External ear normal.       Posterior pharynx erythema but no exudate  Neck: Neck supple.       Minimal anterior cervical adenopathy  Cardiovascular: Normal rate and regular rhythm.   Pulmonary/Chest: Effort normal and breath sounds normal. No respiratory distress. She has no wheezes. She has no rales.  Skin: No rash noted.          Assessment & Plan:  Pharyngitis. Rapid strep negative. ? False negative. Possibly viral but concerning is her lack of nasal congestion or cough. Also, possible recent exposure to strep. Start amoxicillin 875 mg twice a day for 10 days.

## 2011-11-30 NOTE — Patient Instructions (Addendum)

## 2012-08-01 ENCOUNTER — Encounter: Payer: Self-pay | Admitting: Family Medicine

## 2012-08-01 ENCOUNTER — Ambulatory Visit (INDEPENDENT_AMBULATORY_CARE_PROVIDER_SITE_OTHER): Payer: BC Managed Care – PPO | Admitting: Family Medicine

## 2012-08-01 VITALS — BP 110/84 | Temp 98.6°F | Wt 133.0 lb

## 2012-08-01 DIAGNOSIS — L819 Disorder of pigmentation, unspecified: Secondary | ICD-10-CM

## 2012-08-01 DIAGNOSIS — S39012A Strain of muscle, fascia and tendon of lower back, initial encounter: Secondary | ICD-10-CM

## 2012-08-01 DIAGNOSIS — S335XXA Sprain of ligaments of lumbar spine, initial encounter: Secondary | ICD-10-CM

## 2012-08-01 MED ORDER — CYCLOBENZAPRINE HCL 5 MG PO TABS: 5.0000 mg | ORAL_TABLET | Freq: Three times a day (TID) | ORAL | Status: DC | PRN

## 2012-08-01 NOTE — Progress Notes (Signed)
  Subjective:    Patient ID: Ann Lee, female    DOB: 1956/02/08, 57 y.o.   MRN: [id number]  HPI Acute visit Low back pain.  Onset yesterday.  Lower lumbar and bilateral. No radiation. No radiculopathy.  Burning quality.  Severity 7/10 and 10/10 last night. No numbness or weakness.  Better sitting than lying.   No alleviating.  Advil without relief.   Did significant shoveling of snow.  Slipped on ice but didn't fall.   No urinary symptoms. No abdominal pain.  Brownish lesion base of nose left side. Patient relates positive family history of melanoma in 2 first degree relatives. History of frequent sun burn in childhood.  Past Medical History  Diagnosis Date  . HERPES ZOSTER 10/18/2008  . SINUSITIS, ACUTE 05/03/2009  . SORE THROAT 02/12/2009  . TMJ SYNDROME 02/11/2009  . NECK PAIN 02/15/2009  . PARESTHESIA 10/30/2009  . PERS HX TOBACCO USE PRESENTING HAZARDS HEALTH 03/10/2010   No past surgical history on file.  reports that she has been smoking Cigarettes.  She has a 24 pack-year smoking history. She does not have any smokeless tobacco history on file. She reports that she does not drink alcohol. Her drug history is not on file. family history includes Arthritis in her father and mother; Cancer in her father and mother; and Hyperlipidemia in her father. Allergies  Allergen Reactions  . Iodine Hives and Swelling  . Shellfish Allergy Hives and Swelling     Review of Systems  Constitutional: Negative for fever, chills, appetite change and unexpected weight change.  Respiratory: Negative for shortness of breath.   Cardiovascular: Negative for chest pain.  Gastrointestinal: Negative for nausea, vomiting and abdominal pain.  Genitourinary: Negative for dysuria.  Musculoskeletal: Positive for back pain. Negative for gait problem.  Skin: Negative for rash.  Neurological: Negative for weakness and numbness.       Objective:   Physical Exam  Constitutional: She appears  well-developed and well-nourished. No distress.  Cardiovascular: Normal rate and regular rhythm.   Pulmonary/Chest: Effort normal and breath sounds normal. No respiratory distress. She has no wheezes. She has no rales.  Musculoskeletal: She exhibits no edema.  Straight leg raise is negative. No lumbar tenderness to palpation. Patient has mild back pain lower lumbar area with back flexion but able flex 90  Neurological:  Full-strength lower extremities. Symmetric lower extremity reflexes.  Skin:  Minimally raised well-demarcated slightly scaly lesion left nose near the base          Assessment & Plan:  #1 low back pain. Suspect muscular. Nonfocal exam. Low-dose Flexeril 5 mg at night. Stretches given. Try heat for symptomatic relief. Short-term use of Advil or Aleve. #2 skin lesion left nose. Suspect seborrheic keratosis. Very strong family history of melanoma. Patient requesting dermatology referral and we'll set

## 2012-08-01 NOTE — Patient Instructions (Addendum)

## 2012-09-27 ENCOUNTER — Ambulatory Visit: Payer: BC Managed Care – PPO | Admitting: Family Medicine

## 2012-10-19 ENCOUNTER — Ambulatory Visit (INDEPENDENT_AMBULATORY_CARE_PROVIDER_SITE_OTHER): Payer: BC Managed Care – PPO | Admitting: Family Medicine

## 2012-10-19 ENCOUNTER — Encounter: Payer: Self-pay | Admitting: Family Medicine

## 2012-10-19 VITALS — BP 90/60 | Temp 99.0°F

## 2012-10-19 DIAGNOSIS — J029 Acute pharyngitis, unspecified: Secondary | ICD-10-CM

## 2012-10-19 LAB — POCT RAPID STREP A (OFFICE): Rapid Strep A Screen: NEGATIVE

## 2012-10-19 MED ORDER — AMOXICILLIN 875 MG PO TABS: 875.0000 mg | ORAL_TABLET | Freq: Two times a day (BID) | ORAL | Status: DC

## 2012-10-19 NOTE — Patient Instructions (Addendum)

## 2012-10-19 NOTE — Progress Notes (Signed)
  Subjective:    Patient ID: Ann Lee, female    DOB: [birth year], 56 y.o.   MRN: [id number]  HPI Acute illness Onset sore throat yesterday. Headaches and body aches.  Increased fatigue Son and grandson had recent strep. No nausea or vomiting. Fever up to 102.4 yesterday.  No known drug allergies   Review of Systems  Constitutional: Positive for fever, chills and fatigue.  HENT: Positive for sore throat. Negative for congestion.   Respiratory: Negative for cough.   Cardiovascular: Negative for chest pain.  Skin: Negative for rash.  Neurological: Positive for headaches.       Objective:   Physical Exam  Constitutional: She appears well-developed and well-nourished.  HENT:  Right Ear: External ear normal.  Left Ear: External ear normal.  Mild posterior pharynx erythema. No exudate  Neck: Neck supple.  Patient has minimal anterior cervical adenopathy  Cardiovascular: Normal rate and regular rhythm.   Pulmonary/Chest: Effort normal and breath sounds normal. No respiratory distress. She has no wheezes. She has no rales.  Lymphadenopathy:    She has cervical adenopathy.  Skin: No rash noted.          Assessment & Plan:  Pharyngitis. Rapid strep negative. Suspect possible false-negative. She has recent exposure to strep in 2 or 3 individuals and has fever, acute pharyngitis with lack of typical viral symptoms such as cough or nasal congestion. Discussed pros and cons of culture and decided against. Start amoxicillin 875 mg twice daily for 10 days. Symptomatic measures for sore throat relief

## 2012-11-28 ENCOUNTER — Ambulatory Visit (INDEPENDENT_AMBULATORY_CARE_PROVIDER_SITE_OTHER): Payer: BC Managed Care – PPO | Admitting: Family Medicine

## 2012-11-28 ENCOUNTER — Encounter: Payer: Self-pay | Admitting: Family Medicine

## 2012-11-28 VITALS — BP 130/84 | Temp 98.7°F | Wt 126.0 lb

## 2012-11-28 DIAGNOSIS — H6120 Impacted cerumen, unspecified ear: Secondary | ICD-10-CM

## 2012-11-28 DIAGNOSIS — H6121 Impacted cerumen, right ear: Secondary | ICD-10-CM

## 2012-11-28 DIAGNOSIS — G43909 Migraine, unspecified, not intractable, without status migrainosus: Secondary | ICD-10-CM

## 2012-11-28 MED ORDER — SUMATRIPTAN SUCCINATE 100 MG PO TABS: ORAL_TABLET | ORAL | Status: DC

## 2012-11-28 NOTE — Progress Notes (Signed)
  Subjective:    Patient ID: Ann Lee, female    DOB: May 02, 1956, 57 y.o.   MRN: [id number]  HPI Acute visit Right ear pain. Feels similar to when she had swimmer's ear previously. No recent water exposure. No drainage. Has had some wax buildup previously Denies any drainage. No fever. No chills. No hearing changes. She tried some hydrogen peroxide without relief  Past Medical History  Diagnosis Date  . HERPES ZOSTER 10/18/2008  . SINUSITIS, ACUTE 05/03/2009  . SORE THROAT 02/12/2009  . TMJ SYNDROME 02/11/2009  . NECK PAIN 02/15/2009  . PARESTHESIA 10/30/2009  . PERS HX TOBACCO USE PRESENTING HAZARDS HEALTH 03/10/2010   No past surgical history on file.  reports that she has been smoking Cigarettes.  She has a 24 pack-year smoking history. She does not have any smokeless tobacco history on file. She reports that she does not drink alcohol. Her drug history is not on file. family history includes Arthritis in her father and mother; Cancer in her father and mother; and Hyperlipidemia in her father. Allergies  Allergen Reactions  . Iodine Hives and Swelling  . Shellfish Allergy Hives and Swelling      Review of Systems  Constitutional: Negative for fever and chills.  HENT: Positive for ear pain. Negative for hearing loss, congestion, tinnitus and ear discharge.   Neurological: Negative for headaches.       Objective:   Physical Exam  Constitutional: She appears well-developed and well-nourished.  HENT:  Left Ear: External ear normal.  Mouth/Throat: Oropharynx is clear and moist.  Right eardrum is not visualized. She has impaction right ear canal with cerumen Removed with combination of irrigation and curette. Eardrum appears normal. Ear canal appears normal  Skin: No rash noted.          Assessment & Plan:  Cerumen right ear canal. Irrigation with removal of cerumen with the assistance of curette. Eardrum and ear canal appear normal.

## 2012-11-28 NOTE — Patient Instructions (Addendum)
Cerumen Impaction A cerumen impaction is when the wax in your ear forms a plug. This plug usually causes reduced hearing. Sometimes it also causes an earache or dizziness. Removing a cerumen impaction can be difficult and painful. The wax sticks to the ear canal. The canal is sensitive and bleeds easily. If you try to remove a heavy wax buildup with a cotton tipped swab, you may push it in further. Irrigation with water, suction, and small ear curettes may be used to clear out the wax. If the impaction is fixed to the skin in the ear canal, ear drops may be needed for a few days to loosen the wax. People who build up a lot of wax frequently can use ear wax removal products available in your local drugstore. SEEK MEDICAL CARE IF:  You develop an earache, increased hearing loss, or marked dizziness. Document Released: 07/09/2004 Document Revised: 08/24/2011 Document Reviewed: 08/29/2009 [employer] Patient Information 2014 [employer].  

## 2012-12-21 ENCOUNTER — Other Ambulatory Visit: Payer: Self-pay | Admitting: Family Medicine

## 2013-01-18 ENCOUNTER — Ambulatory Visit (INDEPENDENT_AMBULATORY_CARE_PROVIDER_SITE_OTHER): Payer: BC Managed Care – PPO | Admitting: Family Medicine

## 2013-01-18 ENCOUNTER — Encounter: Payer: Self-pay | Admitting: Family Medicine

## 2013-01-18 VITALS — BP 104/70 | HR 99 | Temp 98.2°F | Wt 130.0 lb

## 2013-01-18 DIAGNOSIS — M546 Pain in thoracic spine: Secondary | ICD-10-CM

## 2013-01-18 MED ORDER — METAXALONE 800 MG PO TABS: 800.0000 mg | ORAL_TABLET | Freq: Three times a day (TID) | ORAL | Status: DC | PRN

## 2013-01-18 NOTE — Progress Notes (Signed)
  Subjective:    Patient ID: Ann Lee, female    DOB: March 20, 1956, 57 y.o.   MRN: [id number]  HPI Acute visit Mid thoracic back pain left-sided. Onset 2 days ago. She first noticed after lifting over and getting up some drinks. Location is midthoracic area left side with no radiation. Quality is burning type discomfort. Severe pain at times. No associated pleuritic pain. No fevers or chills. No cough. Took a couple Advil 15 minutes ago and thus far not much relief. She feels her muscles are spasming at times  Past Medical History  Diagnosis Date  . HERPES ZOSTER 10/18/2008  . SINUSITIS, ACUTE 05/03/2009  . SORE THROAT 02/12/2009  . TMJ SYNDROME 02/11/2009  . NECK PAIN 02/15/2009  . PARESTHESIA 10/30/2009  . PERS HX TOBACCO USE PRESENTING HAZARDS HEALTH 03/10/2010   No past surgical history on file.  reports that she has been smoking Cigarettes.  She has a 24 pack-year smoking history. She does not have any smokeless tobacco history on file. She reports that she does not drink alcohol. Her drug history is not on file. family history includes Arthritis in her father and mother; Cancer in her father and mother; and Hyperlipidemia in her father. Allergies  Allergen Reactions  . Iodine Hives and Swelling  . Shellfish Allergy Hives and Swelling      Review of Systems  Constitutional: Negative for fever and chills.  Respiratory: Negative for cough and shortness of breath.   Cardiovascular: Negative for chest pain.  Musculoskeletal: Positive for back pain.  Skin: Negative for rash.       Objective:   Physical Exam  Constitutional: She appears well-developed and well-nourished.  Cardiovascular: Normal rate and regular rhythm.   Pulmonary/Chest: Effort normal and breath sounds normal. No respiratory distress. She has no wheezes. She has no rales.  Musculoskeletal:  Back exam reveals no thoracic spinal tenderness. She has some muscular tenderness mid left thoracic region.   Neurological:  No focal strength deficits  Skin: No rash noted.          Assessment & Plan:  Muscular strain left thoracic back. Continued heat or ice for symptom relief. Continue Advil. Add Skelaxin 800 mg every 8 hours as needed. Consider local muscle massage. Touch base one to 2 weeks if no better

## 2013-01-18 NOTE — Patient Instructions (Addendum)
Try heat or ice for symptom relief.  Consider muscle massage.  Touch base in 1-2 weeks if no better.

## 2013-06-03 ENCOUNTER — Ambulatory Visit (INDEPENDENT_AMBULATORY_CARE_PROVIDER_SITE_OTHER): Payer: BC Managed Care – PPO | Admitting: Family Medicine

## 2013-06-03 ENCOUNTER — Encounter: Payer: Self-pay | Admitting: Family Medicine

## 2013-06-03 ENCOUNTER — Ambulatory Visit (HOSPITAL_COMMUNITY)
Admission: RE | Admit: 2013-06-03 | Discharge: 2013-06-03 | Disposition: A | Discharge status: Home / Self Care | Payer: BC Managed Care – PPO | Source: Ambulatory Visit | Attending: Family Medicine | Admitting: Family Medicine

## 2013-06-03 VITALS — BP 122/72 | HR 84 | Resp 16 | Wt 130.2 lb

## 2013-06-03 DIAGNOSIS — M79675 Pain in left toe(s): Secondary | ICD-10-CM | POA: Insufficient documentation

## 2013-06-03 DIAGNOSIS — M7989 Other specified soft tissue disorders: Secondary | ICD-10-CM | POA: Insufficient documentation

## 2013-06-03 DIAGNOSIS — M19079 Primary osteoarthritis, unspecified ankle and foot: Secondary | ICD-10-CM | POA: Insufficient documentation

## 2013-06-03 DIAGNOSIS — M79609 Pain in unspecified limb: Secondary | ICD-10-CM

## 2013-06-03 MED ORDER — IBUPROFEN 800 MG PO TABS: 800.0000 mg | ORAL_TABLET | Freq: Three times a day (TID) | ORAL | Status: DC | PRN

## 2013-06-03 MED ORDER — TRAMADOL HCL 50 MG PO TABS: 50.0000 mg | ORAL_TABLET | Freq: Three times a day (TID) | ORAL | Status: DC | PRN

## 2013-06-03 NOTE — Progress Notes (Signed)
   Subjective:    Patient ID: Ann Lee, female    DOB: 1956/03/08, 57 y.o.   MRN: [id number]  HPI Foot pain- L 1st MTP joint pain.  Pt stepped off a curb 'the wrong way' and immediate pain.  'it feels like a metal spike coming up through'.  Unable to put sheet on foot due to pain.  No pain unless something touches it or weight bearing.  No hx of gout.  No ETOH or seafood.  Has not increased beef intake.   Review of Systems For ROS see HPI     Objective:   Physical Exam  Vitals reviewed. Constitutional: She appears well-developed and well-nourished.  Obviously uncomfortable  Cardiovascular: Intact distal pulses.   Musculoskeletal: She exhibits edema (over L 1st MTP joint) and tenderness.  Skin: Skin is warm and dry. There is erythema (and warmth overlying L 1st MTP joint).          Assessment & Plan:

## 2013-06-03 NOTE — Patient Instructions (Addendum)
Go to [facility] and get your xray and labs done [facility] call you with your results Start the Ibuprofen 3x/day for pain and inflammation- take w/ food Use the Tramadol as needed for pain relief [facility] Call with any questions or concerns Hang in there!

## 2013-06-05 ENCOUNTER — Ambulatory Visit (INDEPENDENT_AMBULATORY_CARE_PROVIDER_SITE_OTHER): Payer: BC Managed Care – PPO | Admitting: Family Medicine

## 2013-06-05 ENCOUNTER — Telehealth: Payer: Self-pay | Admitting: Family Medicine

## 2013-06-05 ENCOUNTER — Telehealth: Payer: Self-pay | Admitting: *Deleted

## 2013-06-05 ENCOUNTER — Encounter: Payer: Self-pay | Admitting: Family Medicine

## 2013-06-05 ENCOUNTER — Other Ambulatory Visit: Payer: Self-pay

## 2013-06-05 ENCOUNTER — Telehealth: Payer: Self-pay | Admitting: General Practice

## 2013-06-05 VITALS — BP 120/70 | HR 72 | Temp 98.1°F | Wt 130.0 lb

## 2013-06-05 DIAGNOSIS — L609 Nail disorder, unspecified: Secondary | ICD-10-CM

## 2013-06-05 DIAGNOSIS — M79609 Pain in unspecified limb: Secondary | ICD-10-CM

## 2013-06-05 DIAGNOSIS — M79672 Pain in left foot: Secondary | ICD-10-CM

## 2013-06-05 DIAGNOSIS — L608 Other nail disorders: Secondary | ICD-10-CM

## 2013-06-05 MED ORDER — SUMATRIPTAN SUCCINATE 100 MG PO TABS: 100.0000 mg | ORAL_TABLET | ORAL | Status: DC | PRN

## 2013-06-05 NOTE — Telephone Encounter (Signed)
Patient states that she was seen at the [facility] on 06/03/2013 and you left a message to give you a call back.

## 2013-06-05 NOTE — Telephone Encounter (Signed)
Pt notified of xray results

## 2013-06-05 NOTE — Telephone Encounter (Signed)
Pt was seen at [facility] and was told to follow up with Dr on Monday for painful foot. There's no more avail appt today. Please advise

## 2013-06-05 NOTE — Assessment & Plan Note (Signed)
New.  Get xray to r/o fracture in setting of awkward step off curb.  Redness, warmth and swelling of 1st MTP joint consistent w/ gout but pt w/out hx.  Get uric acid level.  NSAIDs, pain meds, post-op shoe.  Reviewed supportive care and red flags that should prompt return.  Pt expressed understanding and is in agreement w/ plan.

## 2013-06-05 NOTE — Progress Notes (Signed)
   Subjective:    Patient ID: Ann Lee, female    DOB: Apr 26, 1956, 57 y.o.   MRN: [id number]  HPI Patient seen for the following issues  Left foot pain. Friday she was stepping out of her vehicle and misjudged the curb and hyperextended her great toe. Had some pain mostly around the MTP joint and went to [facility]. X-rays unremarkable. She has bunion involving that foot. She has some pain with ambulation. She had uric acid level 4.7. No history of gout. No warmth or erythema. Patient also recently noted linear black line involving her left foot great toe. She's never had any skin cancer but was worried about possibility of melanoma  Past Medical History  Diagnosis Date  . HERPES ZOSTER 10/18/2008  . SINUSITIS, ACUTE 05/03/2009  . SORE THROAT 02/12/2009  . TMJ SYNDROME 02/11/2009  . NECK PAIN 02/15/2009  . PARESTHESIA 10/30/2009  . PERS HX TOBACCO USE PRESENTING HAZARDS HEALTH 03/10/2010   No past surgical history on file.  reports that she has been smoking Cigarettes.  She has a 24 pack-year smoking history. She does not have any smokeless tobacco history on file. She reports that she does not drink alcohol. Her drug history is not on file. family history includes Arthritis in her father and mother; Cancer in her father and mother; Hyperlipidemia in her father. Allergies  Allergen Reactions  . Iodine Hives and Swelling  . Shellfish Allergy Hives and Swelling      Review of Systems  Constitutional: Negative for appetite change and unexpected weight change.       Objective:   Physical Exam  Constitutional: She appears well-developed and well-nourished.  Cardiovascular: Normal rate.   Pulmonary/Chest: Effort normal and breath sounds normal. No respiratory distress. She has no wheezes. She has no rales.  Musculoskeletal:  Left foot reveals prominent bunion. She has some tenderness from MTP joint but no warmth or erythema. She has pain with extension of the great toe. No  localizing metatarsal tenderness.   Skin:  Left great toenail reveals linear black and line along the lateral border          Assessment & Plan:  Left foot pain. Suspect ligament strain from recent injury. X-rays no acute findings. Wear good support shoes and patient will followup with podiatrist. She has concerns regarding progressive bunion pain.  Linear blackened discoloration left great toenail. Patient will see podiatrist as above for consideration of possible biopsy

## 2013-06-05 NOTE — Telephone Encounter (Signed)
FYI.   Received a fax from [facility] regarding pt's Uric Acid levels from Saturday [facility] Visit. Uric Acid: 4.7 Also Pt's Xrays were Negative.

## 2013-06-05 NOTE — Telephone Encounter (Signed)
Pt is coming in today to be seen.

## 2013-06-05 NOTE — Progress Notes (Signed)
Pre visit review using our clinic review tool, if applicable. No additional management support is needed unless otherwise documented below in the visit note. 

## 2013-06-05 NOTE — Patient Instructions (Signed)
Set up complete physical and podiatry appointment

## 2013-07-27 ENCOUNTER — Encounter: Payer: Self-pay | Admitting: Family Medicine

## 2013-08-15 ENCOUNTER — Encounter: Payer: Self-pay | Admitting: Family Medicine

## 2013-08-15 ENCOUNTER — Telehealth: Payer: Self-pay | Admitting: Family Medicine

## 2013-08-15 ENCOUNTER — Ambulatory Visit (INDEPENDENT_AMBULATORY_CARE_PROVIDER_SITE_OTHER): Payer: BC Managed Care – PPO | Admitting: Family Medicine

## 2013-08-15 VITALS — BP 100/68 | HR 103 | Temp 99.4°F | Wt 133.0 lb

## 2013-08-15 DIAGNOSIS — B9789 Other viral agents as the cause of diseases classified elsewhere: Secondary | ICD-10-CM

## 2013-08-15 DIAGNOSIS — B349 Viral infection, unspecified: Secondary | ICD-10-CM

## 2013-08-15 DIAGNOSIS — R509 Fever, unspecified: Secondary | ICD-10-CM

## 2013-08-15 NOTE — Telephone Encounter (Signed)
Relevant patient education assigned to patient using Emmi. ° °

## 2013-08-15 NOTE — Progress Notes (Signed)
Pre visit review using our clinic review tool, if applicable. No additional management support is needed unless otherwise documented below in the visit note. 

## 2013-08-15 NOTE — Patient Instructions (Signed)

## 2013-08-15 NOTE — Progress Notes (Signed)
Chief Complaint  Patient presents with  . 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  Past Medical History  Diagnosis Date  . HERPES ZOSTER 10/18/2008  . SINUSITIS, ACUTE 05/03/2009  . SORE THROAT 02/12/2009  . TMJ SYNDROME 02/11/2009  . NECK PAIN 02/15/2009  . PARESTHESIA 10/30/2009  . PERS HX TOBACCO USE PRESENTING HAZARDS HEALTH 03/10/2010    No past surgical history on file.  Family History  Problem Relation Age of Onset  . Cancer Mother     esophageal  . Arthritis Mother   . Hyperlipidemia Father   . Arthritis Father   . Cancer Father     colon    History   Social History  . Marital Status: Divorced    Spouse Name: N/A    Number of Children: N/A  . Years of Education: N/A   Social History Main Topics  . Smoking status: Current Every Day Smoker -- 0.75 packs/day for 32 years    Types: Cigarettes  . Smokeless tobacco: None  . Alcohol Use: No  . Drug Use:   . Sexual Activity:    Other Topics Concern  . None   Social History Narrative  . None    Current outpatient prescriptions:SUMAtriptan (IMITREX) 100 MG tablet, Take 1 tablet (100 mg total) by mouth every 2 (two) hours as needed for migraine or headache. May repeat in 2 hours if headache persists or recurs., Disp: 9 tablet, Rfl: 3  EXAM:  Filed Vitals:   08/15/13 1048  BP: 100/68  Pulse: 103  Temp: 99.4 F (37.4 C)    Body mass index is 24.32  kg/(m^2).  GENERAL: vitals reviewed and listed above, alert, oriented, appears well hydrated and in no acute distress  HEENT: atraumatic, conjunttiva mildly erythematous bilat, no obvious abnormalities on inspection of external nose and ears, normal appearance of ear canals and TMs, clear nasal congestion, mild post oropharyngeal erythema with PND, no tonsillar edema or exudate, no sinus TTP  NECK: no obvious masses on inspection  LUNGS: clear to auscultation bilaterally, no wheezes, rales or rhonchi, good air movement  CV: HRRR, no peripheral edema  MS: moves all extremities without noticeable abnormality  PSYCH: pleasant and cooperative, no obvious depression or anxiety  ASSESSMENT AND PLAN:  Discussed the following assessment and plan:  Viral illness  Fever in newborn  Fever - Plan: POC Rapid Strep A, Throat culture ([facility])  -given HPI and exam findings today, a serious infection or illness is unlikely. We discussed potential etiologies, with VURI being most likely, and advised supportive care and monitoring. We discussed treatment side effects, likely course, antibiotic misuse, transmission, and signs of developing a serious illness. -strep screen and culture given possible exposure though symptoms suggest viral illness -discussed potential for flu and tamiflu, she decided she would not want tamiflu and opted not to test for this -of course, we advised to return or notify a doctor immediately if symptoms worsen  or persist or new concerns arise.    Patient Instructions  INSTRUCTIONS FOR UPPER RESPIRATORY INFECTION:  -plenty of rest and fluids  -nasal saline wash 2-3 times daily (use prepackaged nasal saline or bottled/distilled water if making your own)   -clean nose with nasal saline before using the nasal steroid or sinex  -can use sinex nasal spray for drainage and nasal congestion - but do NOT use longer then 3-4 days  -can use tylenol or ibuprofen as directed for  aches and sorethroat  -in the winter time, using a humidifier at night is helpful (please follow cleaning instructions)  -if you are taking a cough medication - use only as directed, may also try a teaspoon of honey to coat the throat and throat lozenges  -for sore throat, salt water gargles can help  -follow up if you have fevers, facial pain, tooth pain, difficulty breathing or are worsening or not getting better in 5-7 days      Amoreena Neubert R.

## 2013-08-17 LAB — CULTURE, GROUP A STREP: Organism ID, Bacteria: NORMAL

## 2014-03-23 ENCOUNTER — Ambulatory Visit (INDEPENDENT_AMBULATORY_CARE_PROVIDER_SITE_OTHER): Payer: BC Managed Care – PPO | Admitting: Family Medicine

## 2014-03-23 ENCOUNTER — Encounter: Payer: Self-pay | Admitting: Family Medicine

## 2014-03-23 VITALS — BP 118/70 | HR 76 | Temp 98.6°F | Wt 136.0 lb

## 2014-03-23 DIAGNOSIS — B9789 Other viral agents as the cause of diseases classified elsewhere: Principal | ICD-10-CM

## 2014-03-23 DIAGNOSIS — J069 Acute upper respiratory infection, unspecified: Secondary | ICD-10-CM

## 2014-03-23 DIAGNOSIS — J029 Acute pharyngitis, unspecified: Secondary | ICD-10-CM

## 2014-03-23 LAB — POCT RAPID STREP A (OFFICE): Rapid Strep A Screen: NEGATIVE

## 2014-03-23 MED ORDER — SUMATRIPTAN SUCCINATE 100 MG PO TABS: 100.0000 mg | ORAL_TABLET | ORAL | Status: DC | PRN

## 2014-03-23 MED ORDER — BENZONATATE 200 MG PO CAPS: 200.0000 mg | ORAL_CAPSULE | Freq: Three times a day (TID) | ORAL | Status: DC | PRN

## 2014-03-23 NOTE — Progress Notes (Signed)
Pre visit review using our clinic review tool, if applicable. No additional management support is needed unless otherwise documented below in the visit note. 

## 2014-03-23 NOTE — Patient Instructions (Signed)

## 2014-03-23 NOTE — Progress Notes (Signed)
   Subjective:    Patient ID: Ann Lee, female    DOB: [birth year], 58 y.o.   MRN: [id number]  Sore Throat  Associated symptoms include coughing. Pertinent negatives include no vomiting.   Acute visit. Patient seen with onset yesterday of sore throat and productive cough. She denies any fever. No body aches. Minimal nasal congestion. 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 Denies any nausea or vomiting. She has not taken any over-the-counter medications today for her sore throat. She is also requesting refills of Imitrex which works well for her migraine headaches. These are becoming less frequent over time.  Past Medical History  Diagnosis Date  . HERPES ZOSTER 10/18/2008  . SINUSITIS, ACUTE 05/03/2009  . SORE THROAT 02/12/2009  . TMJ SYNDROME 02/11/2009  . NECK PAIN 02/15/2009  . PARESTHESIA 10/30/2009  . PERS HX TOBACCO USE PRESENTING HAZARDS HEALTH 03/10/2010   No past surgical history on file.  reports that she has been smoking Cigarettes.  She has a 24 pack-year smoking history. She does not have any smokeless tobacco history on file. She reports that she does not drink alcohol. Her drug history is not on file. family history includes Arthritis in her father and mother; Cancer in her father and mother; Hyperlipidemia in her father. Allergies  Allergen Reactions  . Iodine Hives and Swelling  . Shellfish Allergy Hives and Swelling      Review of Systems  Constitutional: Positive for fatigue. Negative for fever and chills.  HENT: Positive for sore throat.   Respiratory: Positive for cough.   Gastrointestinal: Negative for nausea and vomiting.       Objective:   Physical Exam  Constitutional: She appears well-developed and well-nourished. No distress.  HENT:  Right Ear: External ear normal.  Left Ear: External ear normal.  Minimal erythema posterior pharynx with no exudate.  Neck: Neck supple.  Cardiovascular: Normal rate and regular rhythm.     Pulmonary/Chest: Effort normal and breath sounds normal. No respiratory distress. She has no wheezes. She has no rales.  Lymphadenopathy:    She has no cervical adenopathy.          Assessment & Plan:  Probable viral URI with cough. Rapid strep negative. Treat symptomatically. Benzonatate 200 mg every 8 hours as needed for cough. Over-the-counter Advil or Aleve and plenty of fluids. Refilled her Imitrex for as needed use.

## 2014-04-18 ENCOUNTER — Encounter: Payer: Self-pay | Admitting: Family Medicine

## 2014-04-18 ENCOUNTER — Ambulatory Visit (INDEPENDENT_AMBULATORY_CARE_PROVIDER_SITE_OTHER): Payer: BC Managed Care – PPO | Admitting: Family Medicine

## 2014-04-18 VITALS — BP 118/74 | HR 67 | Temp 97.9°F | Wt 135.0 lb

## 2014-04-18 DIAGNOSIS — J029 Acute pharyngitis, unspecified: Secondary | ICD-10-CM

## 2014-04-18 LAB — POCT RAPID STREP A (OFFICE): RAPID STREP A SCREEN: NEGATIVE

## 2014-04-18 MED ORDER — AMOXICILLIN 875 MG PO TABS: 875.0000 mg | ORAL_TABLET | Freq: Two times a day (BID) | ORAL | Status: DC

## 2014-04-18 NOTE — Progress Notes (Signed)
   Subjective:    Patient ID: Ann Lee, female    DOB: 1955/12/30, 58 y.o.   MRN: [id number]  HPI Acute visit. Patient seen with one-day history of sore throat. 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 She's not had any cough or nasal congestion. She has some chills yesterday and presented low-grade fever but none now. She has severe sore throat with swallowing and increased redness. She is also tender adenopathy in her anterior cervical region bilaterally. Denies any nausea or vomiting. No headaches.  Past Medical History  Diagnosis Date  . HERPES ZOSTER 10/18/2008  . SINUSITIS, ACUTE 05/03/2009  . SORE THROAT 02/12/2009  . TMJ SYNDROME 02/11/2009  . NECK PAIN 02/15/2009  . PARESTHESIA 10/30/2009  . PERS HX TOBACCO USE PRESENTING HAZARDS HEALTH 03/10/2010   No past surgical history on file.  reports that she has been smoking Cigarettes.  She has a 24 pack-year smoking history. She does not have any smokeless tobacco history on file. She reports that she does not drink alcohol. Her drug history is not on file. family history includes Arthritis in her father and mother; Cancer in her father and mother; Hyperlipidemia in her father. Allergies  Allergen Reactions  . Iodine Hives and Swelling  . Shellfish Allergy Hives and Swelling      Review of Systems  Constitutional: Positive for chills and fatigue.  HENT: Positive for sore throat. Negative for congestion.   Respiratory: Negative for cough.        Objective:   Physical Exam  Constitutional: She appears well-developed and well-nourished.  HENT:  Right Ear: External ear normal.  Left Ear: External ear normal.  Posterior pharynx erythema. No exudate. No soft palate asymmetry  Neck: Neck supple.  She has minimal anterocervical adenopathy bilaterally  Cardiovascular: Normal rate and regular rhythm.   Pulmonary/Chest: Effort normal and breath sounds  normal. No respiratory distress. She has no wheezes. She has no rales.  Lymphadenopathy:    She has cervical adenopathy.          Assessment & Plan:  Acute pharyngitis. Rapid strep is negative but she did have possible exposure to strep and has absence of cough or nasal congestion or other typical viral symptoms. Discussed possible throat culture. We'll elected to cover with amoxicillin if she has recurrent fever today to start 875 mg twice a day for 10 days. Follow-up when necessary

## 2014-04-18 NOTE — Progress Notes (Signed)
Pre visit review using our clinic review tool, if applicable. No additional management support is needed unless otherwise documented below in the visit note. 

## 2014-04-18 NOTE — Patient Instructions (Signed)

## 2014-11-15 ENCOUNTER — Telehealth: Payer: Self-pay | Admitting: *Deleted

## 2014-11-15 NOTE — Telephone Encounter (Signed)
Left message on voicemail to call office. Called to see if had Mammogram

## 2014-11-22 ENCOUNTER — Other Ambulatory Visit (INDEPENDENT_AMBULATORY_CARE_PROVIDER_SITE_OTHER): Payer: Self-pay

## 2014-11-22 DIAGNOSIS — Z0000 Encounter for general adult medical examination without abnormal findings: Secondary | ICD-10-CM

## 2014-11-29 ENCOUNTER — Encounter: Payer: Self-pay | Admitting: Family Medicine

## 2014-11-30 ENCOUNTER — Telehealth: Payer: Self-pay | Admitting: Family Medicine

## 2014-11-30 NOTE — Telephone Encounter (Signed)
Called pt to resch her cpe she cancelled (via automation) and pt states the lab had called her and said they did not get enough blood And she needed to return. Do you know anything abut this?  Pt did not return.

## 2014-12-03 NOTE — Telephone Encounter (Signed)
Pt will cb to sch. Busy this week.

## 2014-12-03 NOTE — Telephone Encounter (Signed)
Yes she does need to return back to get some labwork.

## 2015-02-23 ENCOUNTER — Encounter: Payer: Self-pay | Admitting: Internal Medicine

## 2015-02-23 ENCOUNTER — Ambulatory Visit (INDEPENDENT_AMBULATORY_CARE_PROVIDER_SITE_OTHER): Payer: BLUE CROSS/BLUE SHIELD | Admitting: Internal Medicine

## 2015-02-23 VITALS — BP 110/80 | HR 69 | Temp 98.3°F | Ht 62.0 in | Wt 133.5 lb

## 2015-02-23 DIAGNOSIS — R21 Rash and other nonspecific skin eruption: Secondary | ICD-10-CM

## 2015-02-23 MED ORDER — TRIAMCINOLONE ACETONIDE 0.1 % EX LOTN: 1.0000 "application " | TOPICAL_LOTION | Freq: Three times a day (TID) | CUTANEOUS | Status: DC

## 2015-02-23 NOTE — Progress Notes (Signed)
   Subjective:    Patient ID: Ann Lee, female    DOB: 06-07-1956, 59 y.o.   MRN: [id number]  HPI Here due to rash and scalp problem  2 nights ago--felt itching in scalp and dry spot Sister checked for lice--nothing there Yesterday--head burning, especially in occiput Feels small lump back there Weird spots on left and right chest  No new shampoo or medications Works in Clinical cyt[employer]ist  Hasn't tried any Rx---"feels so weird I am afraid to try anything"  Current Outpatient Prescriptions on File Prior to Visit  Medication Sig Dispense Refill  . SUMAtriptan (IMITREX) 100 MG tablet Take 1 tablet (100 mg total) by mouth every 2 (two) hours as needed for migraine or headache. May repeat in 2 hours if headache persists or recurs. 9 tablet 3   No current facility-administered medications on file prior to visit.    Allergies  Allergen Reactions  . Iodine Hives and Swelling  . Shellfish Allergy Hives and Swelling    Past Medical History  Diagnosis Date  . HERPES ZOSTER 10/18/2008  . SINUSITIS, ACUTE 05/03/2009  . SORE THROAT 02/12/2009  . TMJ SYNDROME 02/11/2009  . NECK PAIN 02/15/2009  . PARESTHESIA 10/30/2009  . PERS HX TOBACCO USE PRESENTING HAZARDS HEALTH 03/10/2010    No past surgical history on file.  Family History  Problem Relation Age of Onset  . Cancer Mother     esophageal  . Arthritis Mother   . Hyperlipidemia Father   . Arthritis Father   . Cancer Father     colon    Social History   Social History  . Marital Status: Divorced    Spouse Name: N/A  . Number of Children: N/A  . Years of Education: N/A   Occupational History  . Not on file.   Social History Main Topics  . Smoking status: Current Every Day Smoker -- 0.75 packs/day for 32 years    Types: Cigarettes  . Smokeless tobacco: Never Used  . Alcohol Use: No  . Drug Use: No  . Sexual Activity: Not on file   Other Topics Concern  . Not on file   Social History Narrative  ' Review of  Systems No fever Doesn't feel sick Recent trip to [place], out to [employer] yesterday    Objective:   Physical Exam  Skin:  Small bruise on left thumb Linear blanching red macules horizontal above right clavicle Redness/inflammation along occiput--small area ~4 x 8cm only Small node in left occiput          Assessment & Plan:

## 2015-02-23 NOTE — Assessment & Plan Note (Signed)
Isolated to occiput Doesn't look infectious Not typical for seborrhea Unsure what lesion is above right clavicle-- traumatic or bites??  Will treat with cortisone lotion ?derm eval if persist

## 2015-02-23 NOTE — Progress Notes (Signed)
Pre visit review using our clinic review tool, if applicable. No additional management support is needed unless otherwise documented below in the visit note. 

## 2015-02-25 ENCOUNTER — Telehealth: Payer: Self-pay | Admitting: *Deleted

## 2015-02-25 NOTE — Telephone Encounter (Signed)
Patient was seen at [facility]  ------------------------------------------------------------------------------------------------------------------------------------------------------------------------------------------------------------------------ PLEASE NOTE: All timestamps contained within this report are represented as [place] Standard Time. CONFIDENTIALTY NOTICE: This fax transmission is intended only for the addressee. It contains information that is legally privileged, confidential or otherwise protected from use or disclosure. If you are not the intended recipient, you are strictly prohibited from reviewing, disclosing, copying using or disseminating any of this information or taking any action in reliance on or regarding this information. If you have received this fax in error, please notify us immediately by telephone so that we can arrange for its return to [place]. Phone: [phone], Toll-Free: [phone], Fax: [phone] Page: 1 of 2 Call Id: [id number] [facility] - Client [place] Patient Name: Ann Lee Gender: Female DOB: [birth year] Age: 59 Y 23 M 24 D Return Phone Number: [phone] (Primary) Address: City/State/Zip: [place] Client [facility] - Client Client Site [facility] - Night Physician Carolann Littler Contact Type Call Call Type Triage / Clinical Relationship To Patient Self Return Phone Number [phone] (Primary) Chief Complaint Unclassified Symptom Initial Comment Caller states c/o scalp is burning, not sure what happened PreDisposition Go to ED Nurse Assessment Nurse: Genoveva Ill, RN, Lattie Haw Date/Time (Eastern Time): 02/23/2015 9:14:35 AM Confirm and document reason for call. If symptomatic, describe symptoms. ---Caller states c/o scalp is burning with redness and back of head feels bruised; noticed small lump at back of neck last  night; no injuries and doesn't know why Has the patient traveled out of the country within the last 30 days? ---No Does the patient require triage? ---Yes Related visit to physician within the last 2 weeks? ---No Does the PT have any chronic conditions? (i.e. diabetes, asthma, etc.) ---Yes List chronic conditions. ---migraines Guidelines Guideline Title Affirmed Question Affirmed Notes Nurse Date/Time (Eastern Time) Rash or Redness - Localized [1] Looks infected (spreading redness, pus) AND [2] large red area (> 2 in. or 5 cm) Burress, RN, Lattie Haw 02/23/2015 9:18:42 AM Disp. Time Eilene Ghazi Time) Disposition Final User 02/23/2015 8:31:39 AM Send To Clinical Follow Up Rich Brave, Amy 02/23/2015 9:22:04 AM See Physician within 4 Hours (or PCP triage) Yes Burress, RN, Leland Johns Understands: Yes Disagree/Comply: Comply PLEASE NOTE: All timestamps contained within this report are represented as [place] Standard Time. CONFIDENTIALTY NOTICE: This fax transmission is intended only for the addressee. It contains information that is legally privileged, confidential or otherwise protected from use or disclosure. If you are not the intended recipient, you are strictly prohibited from reviewing, disclosing, copying using or disseminating any of this information or taking any action in reliance on or regarding this information. If you have received this fax in error, please notify us immediately by telephone so that we can arrange for its return to [place]. Phone: [phone], Toll-Free: [phone], Fax: [phone] Page: 2 of 2 Call Id: [id number] Care Advice Given Per Guideline SEE PHYSICIAN WITHIN 4 HOURS (or PCP triage): * IF OFFICE WILL BE OPEN: You need to be seen within the next 3 or 4 hours. Call your doctor's office now or as soon as it opens. CALL BACK IF: * You become worse. CARE ADVICE given per Rash - Localized and Cause Unknown (Adult) guideline. After Care Instructions Given Call  Event Type User Date / Time Description Comments User: Margaretha Sheffield, RN Date/Time Eilene Ghazi Time): 02/23/2015 9:25:11 AM warm transferred to [facility] office for appt Referrals [facility]

## 2015-05-08 ENCOUNTER — Other Ambulatory Visit: Payer: Self-pay | Admitting: Family Medicine

## 2015-07-04 ENCOUNTER — Encounter: Payer: Self-pay | Admitting: Family Medicine

## 2015-07-04 ENCOUNTER — Ambulatory Visit (INDEPENDENT_AMBULATORY_CARE_PROVIDER_SITE_OTHER): Payer: BLUE CROSS/BLUE SHIELD | Admitting: Family Medicine

## 2015-07-04 VITALS — BP 112/80 | HR 76 | Temp 98.6°F | Ht 62.0 in | Wt 133.5 lb

## 2015-07-04 DIAGNOSIS — M79605 Pain in left leg: Secondary | ICD-10-CM

## 2015-07-04 DIAGNOSIS — L853 Xerosis cutis: Secondary | ICD-10-CM

## 2015-07-04 DIAGNOSIS — L850 Acquired ichthyosis: Secondary | ICD-10-CM | POA: Diagnosis not present

## 2015-07-04 MED ORDER — TRIAMCINOLONE ACETONIDE 0.1 % EX LOTN: 1.0000 "application " | TOPICAL_LOTION | Freq: Three times a day (TID) | CUTANEOUS | Status: DC

## 2015-07-04 NOTE — Progress Notes (Signed)
   Subjective:    Patient ID: Ann Lee, female    DOB: 1955/12/17, 60 y.o.   MRN: [id number]  HPI Patient seen with 3-4 day history of "achy discomfort "left lower extremity. She describes some discomfort from her buttock area all the way down to the foot at times. Symptoms can occur at rest and she does not describe claudication-like pain with activity. No numbness. No weakness. Denies any low back pain. No abdominal pain. She's taken some Tylenol and aspirin without much relief. Symptoms possibly worse at night. Denies any prior history of radiculopathy symptoms. She is a smoker but no history of known peripheral vascular disease. She has not noted any color or temperature changes of her foot.  Hx of dry skin dermatitis.  Has used triamcinolone lotion in past prn with good success for the itching Requesting refill.    Past Medical History  Diagnosis Date  . HERPES ZOSTER 10/18/2008  . SINUSITIS, ACUTE 05/03/2009  . SORE THROAT 02/12/2009  . TMJ SYNDROME 02/11/2009  . NECK PAIN 02/15/2009  . PARESTHESIA 10/30/2009  . PERS HX TOBACCO USE PRESENTING HAZARDS HEALTH 03/10/2010   No past surgical history on file.  reports that she has been smoking Cigarettes.  She has a 24 pack-year smoking history. She has never used smokeless tobacco. She reports that she does not drink alcohol or use illicit drugs. family history includes Arthritis in her father and mother; Cancer in her father and mother; Hyperlipidemia in her father. Allergies  Allergen Reactions  . Iodine Hives and Swelling  . Shellfish Allergy Hives and Swelling      Review of Systems  Constitutional: Negative for appetite change and unexpected weight change.  Respiratory: Negative for shortness of breath.   Cardiovascular: Negative for chest pain.  Gastrointestinal: Negative for abdominal pain and abdominal distention.  Genitourinary: Negative for dysuria.  Musculoskeletal: Negative for back pain.  Skin: Negative for color  change and wound.  Neurological: Negative for dizziness, weakness and numbness.       Objective:   Physical Exam  Constitutional: She appears well-developed and well-nourished.  Neck: Neck supple. No thyromegaly present.  Cardiovascular: Normal rate and regular rhythm.   Pulmonary/Chest: Effort normal and breath sounds normal. No respiratory distress. She has no wheezes. She has no rales.  Abdominal: Soft. She exhibits no mass. There is no tenderness.  Musculoskeletal: She exhibits no edema.  Patient has 2+ dorsalis pedis and posterior tibial pulses bilaterally. 1+ femoral pulses bilaterally. Excellent capillary refill. Straight leg raise negative. Full range of motion left hip  Neurological:  Deep tendon reflexes are symmetric ankle and knee bilaterally. Full-strength with plantar -flexion dorsiflexion. Full strength with knee extension.  Skin:  Both feet are warm to touch. No color changes          Assessment & Plan:  Patient presents with left lower extremity "ache ". She does not have anything clinically to suggest claudication or peripheral artery disease. No evidence for DVT. Suspect this may be more neuropathic type pain. Doubt restless leg syndrome She'll try some Advil. Watch closely for any weakness or numbness or progressive pain. We discussed other pain medications but she would like to try Advil first.  Dry skin dermatitis.  Refilled Elocon lotion for prn use.

## 2015-07-04 NOTE — Progress Notes (Signed)
Pre visit review using our clinic review tool, if applicable. No additional management support is needed unless otherwise documented below in the visit note. 

## 2015-07-04 NOTE — Patient Instructions (Signed)
Follow up for any leg weakness or numbness or progressive pain Follow up for any skin color changes or cold foot.   Try some Advil and take with food.

## 2015-09-19 ENCOUNTER — Ambulatory Visit (INDEPENDENT_AMBULATORY_CARE_PROVIDER_SITE_OTHER): Payer: BLUE CROSS/BLUE SHIELD | Admitting: Family Medicine

## 2015-09-19 VITALS — BP 122/80 | HR 72 | Temp 98.7°F | Ht 62.0 in | Wt 133.0 lb

## 2015-09-19 DIAGNOSIS — R229 Localized swelling, mass and lump, unspecified: Secondary | ICD-10-CM

## 2015-09-19 MED ORDER — DOXYCYCLINE HYCLATE 100 MG PO CAPS: 100.0000 mg | ORAL_CAPSULE | Freq: Two times a day (BID) | ORAL | Status: DC

## 2015-09-19 NOTE — Progress Notes (Signed)
Pre visit review using our clinic review tool, if applicable. No additional management support is needed unless otherwise documented below in the visit note. 

## 2015-09-19 NOTE — Patient Instructions (Signed)
Warm compresses to arm several times daily Let me know by next week if not improving.

## 2015-09-19 NOTE — Progress Notes (Signed)
   Subjective:    Patient ID: Ann Lee, female    DOB: 1956/02/04, 60 y.o.   MRN: [id number]  HPI  Patient seen with painful nodule right forearm.  First noted about 3 weeks ago. Denies any injury.  No known foreign body. She's tried warm compresses without improvement.  Her sister tried to pick "darkened speck" out of center, thinking that she may pulled out a foreign body of some sort partially. Patient never noted any pustular center. No fevers or chills. No history of MRSA.  Progressive redness and soreness over the past few days  denies any known history of bite or sting  Past Medical History  Diagnosis Date  . HERPES ZOSTER 10/18/2008  . SINUSITIS, ACUTE 05/03/2009  . SORE THROAT 02/12/2009  . TMJ SYNDROME 02/11/2009  . NECK PAIN 02/15/2009  . PARESTHESIA 10/30/2009  . PERS HX TOBACCO USE PRESENTING HAZARDS HEALTH 03/10/2010   No past surgical history on file.  reports that she has been smoking Cigarettes.  She has a 24 pack-year smoking history. She has never used smokeless tobacco. She reports that she does not drink alcohol or use illicit drugs. family history includes Arthritis in her father and mother; Cancer in her father and mother; Hyperlipidemia in her father. Allergies  Allergen Reactions  . Iodine Hives and Swelling  . Shellfish Allergy Hives and Swelling      Review of Systems  Constitutional: Negative for fever and chills.       Objective:   Physical Exam  Constitutional: She appears well-developed and well-nourished.  Cardiovascular: Normal rate and regular rhythm.   Skin:  Right forearm dorsally along the mid aspect she has a 1 x 0.8 cm indurated area of erythema which is tender to palpation. She has a small punctate center which is darkened but no obvious foreign body. No pustule. No vesicle. Nonfluctuant.          Assessment & Plan:   Painful nodule right forearm. Question retained foreign body. No fluctuance. We discussed that this may need to  be excised but she prefers to try warm compresses and antibiotic first and see if this resolves. Doxycycline 100 mg twice a day for 10 days and warm compresses several times daily.  If not improving by next week recommend follow-up to consider either punch biopsy or excisional biopsy of indurated area

## 2015-09-25 ENCOUNTER — Telehealth: Payer: Self-pay | Admitting: Family Medicine

## 2015-09-25 NOTE — Telephone Encounter (Signed)
Yes 30 minute appt

## 2015-09-25 NOTE — Telephone Encounter (Signed)
Pt would like come in on tomorrow to for the right hand nodule and pt state that Dr. Elease Hashimoto asked her come back if it got bigger.  Is it okay to sch her at 4:15?

## 2015-09-26 ENCOUNTER — Ambulatory Visit (INDEPENDENT_AMBULATORY_CARE_PROVIDER_SITE_OTHER): Payer: BLUE CROSS/BLUE SHIELD | Admitting: Family Medicine

## 2015-09-26 VITALS — BP 120/80 | HR 77 | Temp 98.3°F | Ht 62.0 in | Wt 133.8 lb

## 2015-09-26 DIAGNOSIS — R229 Localized swelling, mass and lump, unspecified: Secondary | ICD-10-CM

## 2015-09-26 DIAGNOSIS — C44622 Squamous cell carcinoma of skin of right upper limb, including shoulder: Secondary | ICD-10-CM

## 2015-09-26 NOTE — Patient Instructions (Signed)
Keep wound dry for the first 24 hours then clean daily with soap and water for one week. Apply topical antibiotic daily for 3-4 days. Keep covered with clean dressing for 4-5 days. Follow up promptly for any signs of infection such as redness, warmth, pain, or drainage. Follow up in 8 days for suture removal

## 2015-09-26 NOTE — Progress Notes (Signed)
   Subjective:    Patient ID: Ann Lee, female    DOB: [birth year], 60 y.o.   MRN: [id number]  HPI   Pt is here for procedure only visit. Previously noted right forearm nodule:   Painful nodule right forearm dorsally.  First appeared about one month ago.  Growing rapidly in size.  Initially she thought she saw foreign body near the center and her sister tried to "pick ou"t what she thought was a "stinger ". Patient does not recall any bee sting. No foreign body. No fluctuance. No warmth. Very tender to palpation.  Past Medical History  Diagnosis Date  . HERPES ZOSTER 10/18/2008  . SINUSITIS, ACUTE 05/03/2009  . SORE THROAT 02/12/2009  . TMJ SYNDROME 02/11/2009  . NECK PAIN 02/15/2009  . PARESTHESIA 10/30/2009  . PERS HX TOBACCO USE PRESENTING HAZARDS HEALTH 03/10/2010   No past surgical history on file.  reports that she has been smoking Cigarettes.  She has a 24 pack-year smoking history. She has never used smokeless tobacco. She reports that she does not drink alcohol or use illicit drugs. family history includes Arthritis in her father and mother; Cancer in her father and mother; Hyperlipidemia in her father. Allergies  Allergen Reactions  . Iodine Hives and Swelling  . Shellfish Allergy Hives and Swelling      Review of Systems  Constitutional: Negative for fever and chills.  Hematological: Negative for adenopathy.       Objective:   Physical Exam  Constitutional: She appears well-developed and well-nourished.  Cardiovascular: Normal rate and regular rhythm.   Pulmonary/Chest: Effort normal and breath sounds normal. No respiratory distress. She has no wheezes. She has no rales.  Skin:  Patient has 8-9 mm firm nodular lesion dorsum mid right forearm. Very small umbilication of the center. No fluctuance. Tender to palpation.          Assessment & Plan:   Rapidly growing nodule right forearm. Differential would include keratoacanthoma. Less likely squamous cell  but should be considered. We discussed risk and benefits of elliptical excision and patient consented. Skin prepped with Hibiclens as patient is allergic to Betadine. Anesthesia with 1% Xylocaine with epinephrine. We made approximately 3 cm elliptical excision of the nodule. Minimal bleeding. 5 sutures of 4-0 Ethilon for closure. Antibiotic and dressing applied. Specimen sent to pathology for further evaluation. Return in 8 days for suture removal

## 2015-09-26 NOTE — Telephone Encounter (Signed)
Pt scheduled  

## 2015-10-04 ENCOUNTER — Ambulatory Visit (INDEPENDENT_AMBULATORY_CARE_PROVIDER_SITE_OTHER): Payer: BLUE CROSS/BLUE SHIELD | Admitting: Family Medicine

## 2015-10-04 VITALS — BP 120/90 | HR 82 | Temp 97.7°F | Ht 62.0 in | Wt 132.0 lb

## 2015-10-04 DIAGNOSIS — C4492 Squamous cell carcinoma of skin, unspecified: Secondary | ICD-10-CM

## 2015-10-04 NOTE — Patient Instructions (Signed)
Squamous Cell Carcinoma Squamous cell carcinoma is a common form of skin cancer. It begins in the squamous cells in the outer layer of the skin (epidermis). It occurs most often in parts of the body that are frequently exposed to the sun, such as the face, lips, neck, arms, legs, and hands. However, this condition can occur anywhere on the body, including the inside of the mouth, sites of long-term (chronic) scarring, and the anus. If squamous cell carcinoma is treated soon enough, it rarely spreads to other areas of the body (metastasizes). If it is not treated, it can destroy nearby tissues. In rare cases, it can spread to other areas. CAUSES This condition is usually caused by exposure to ultraviolet (UV) light. UV light may come from the sun or from tanning beds. Other causes include:  Exposure to arsenic.  Exposure to radiation.  Exposure to toxic tars and oils.  Certain genetic conditions, such as xeroderma pigmentosum. RISK FACTORS This condition is more likely to develop in:  People who are older than 59 years of age.  People who have fair skin (light complexion).  People who have blonde or red hair.  People who have blue, green, or gray eyes.  People who have childhood freckling.  People who have had sun exposure over long periods of time, especially during childhood.  People who have had repeated sunburns.  People who have a weakened immune system.  People who have been exposed to certain chemicals, such as tar, soot, and arsenic.  People who have chronic inflammatory conditions.  People who have chronic infections.  People who have an HPV (human papillomavirus) infection.  People who have conditions that cause chronic scarring. These can include burn scars, chronic ulcers, heat (thermal) injuries, and radiation.  People who have had psoralen and ultraviolet A (PUVA) treatments.  People who smoke.  People who use tanning beds. SYMPTOMS This condition often  starts as small pink or brown growths on the skin. The growths have a rough surface that may feel like sandpaper. In some cases, the growths are easier to feel than to see. The growths may develop into a sore that does not heal. DIAGNOSIS This condition may be diagnosed with:  A physical exam.  Removal of a tissue sample to be examined under a microscope (biopsy). TREATMENT Treatment for this condition involves removing the cancerous tissue. The method that is used for this depends on the size and location of the tumors, as well as your overall health. Possible treatments include:  Mohs surgery. In this procedure, the cancerous skin cells are removed layer by layer until all of the tumor has been removed.  Surgical removal (excision) of the tumor. This involves removing the entire tumor and a small amount of normal skin that surrounds it.  Cryosurgery. This involves freezing the tumor with liquid nitrogen.  Plastic surgery. The tumor is removed, and healthy skin from another part of the body is used to cover the wound. This may be done for large tumors that are in areas where it is not possible to stretch the nearby skin to sew the edges of the wound together.  Radiation. This may be used for tumors on the face.  Photodynamic therapy. A chemical cream is applied to the skin, and light exposure is used to activate the chemical.  Electrodesiccation and curettage. This involves alternately scraping and burning the tumor while using an electric current to control bleeding. HOME CARE INSTRUCTIONS  Avoid unprotected sun exposure.  Do self-exams as  told by your health care provider. Look for new spots or changes in your skin.  Keep all follow-up visits as told by your health care provider. This is important.  Do not use tobacco products, including cigarettes, chewing tobacco, or e-cigarettes. If you need help quitting, ask your health care provider. PREVENTION  Avoid the sun when it is the  strongest. This is usually between 10:00 a.m. and 4:00 p.m.  When you are out in the sun, use a sunscreen that has a sun protection factor (SPF) of at least 42.  Apply sunscreen at least 30 minutes before exposure to the sun.  Reapply sunscreen every 2-4 hours while you are outside. Also reapply it after swimming and after excessive sweating.  Always wear hats, protective clothing, and UV-blocking sunglasses when you are outdoors.  Do not use tanning beds. SEEK MEDICAL CARE IF:  You notice any new growths or any changes in your skin.  You have had a squamous cell carcinoma tumor removed and you notice a new growth in the same location.   This information is not intended to replace advice given to you by your health care provider. Make sure you discuss any questions you have with your health care provider.   Document Released: 12/06/2002 Document Revised: 02/20/2015 Document Reviewed: 09/24/2014 Elsevier Interactive Patient Education [employer].

## 2015-10-04 NOTE — Progress Notes (Signed)
   Subjective:    Patient ID: Ann Lee, female    DOB: 1955/07/07, 59 y.o.   MRN: [id number]  HPI  patient here for suture removal-from recent nodule excision.  Recent nodule excision right forearm.  Pathology came back probable squamous cell carcinoma well differentiated with clear margins.  Patient had some bruising but otherwise no problems with healing. No drainage. No redness. No prior history of skin cancer but high risk because of her eye color, hair tight, skin type, and prior exposure to sun along with smoking history  Past Medical History  Diagnosis Date  . HERPES ZOSTER 10/18/2008  . SINUSITIS, ACUTE 05/03/2009  . SORE THROAT 02/12/2009  . TMJ SYNDROME 02/11/2009  . NECK PAIN 02/15/2009  . PARESTHESIA 10/30/2009  . PERS HX TOBACCO USE PRESENTING HAZARDS HEALTH 03/10/2010   No past surgical history on file.  reports that she has been smoking Cigarettes.  She has a 24 pack-year smoking history. She has never used smokeless tobacco. She reports that she does not drink alcohol or use illicit drugs. family history includes Arthritis in her father and mother; Cancer in her father and mother; Hyperlipidemia in her father. Allergies  Allergen Reactions  . Iodine Hives and Swelling  . Shellfish Allergy Hives and Swelling      Review of Systems  Constitutional: Negative for fever and chills.       Objective:   Physical Exam  Constitutional: She appears well-developed and well-nourished.  Cardiovascular: Normal rate and regular rhythm.   Skin:  Right forearm examined. Wound is healing well. Minimal surrounding ecchymosis which is fading. 5 sutures removed. No signs of secondary infection          Assessment & Plan:   Squamous cell carcinoma of right forearm. Well differentiated. Sutures removed. Clear margin. She will schedule physical and we will plan to do total body skin check then.   Discussed prevention of further sun damage

## 2015-10-04 NOTE — Progress Notes (Signed)
Pre visit review using our clinic review tool, if applicable. No additional management support is needed unless otherwise documented below in the visit note. 

## 2015-10-06 DIAGNOSIS — C4492 Squamous cell carcinoma of skin, unspecified: Secondary | ICD-10-CM | POA: Insufficient documentation

## 2015-10-14 ENCOUNTER — Ambulatory Visit (INDEPENDENT_AMBULATORY_CARE_PROVIDER_SITE_OTHER): Payer: BLUE CROSS/BLUE SHIELD | Admitting: Family Medicine

## 2015-10-14 VITALS — BP 120/86 | HR 75 | Temp 98.3°F | Ht 62.0 in | Wt 131.8 lb

## 2015-10-14 DIAGNOSIS — M25532 Pain in left wrist: Secondary | ICD-10-CM | POA: Diagnosis not present

## 2015-10-14 MED ORDER — SUMATRIPTAN SUCCINATE 100 MG PO TABS: 100.0000 mg | ORAL_TABLET | ORAL | Status: DC | PRN

## 2015-10-14 NOTE — Progress Notes (Signed)
   Subjective:    Patient ID: Ann Lee, female    DOB: 10-May-1956, 60 y.o.   MRN: [id number]  HPI  Acute visit for left wrist pain.  Onset about 6 days ago.  Right-hand dominant. No injury.  Pain location is dorsal left wrist  She noticed some pain and increased prominence of the left ulnar styloid region.  No radiation into the digits. No warmth. No erythema.  No ecchymosis.  She took some Advil with mild relief  Past Medical History  Diagnosis Date  . HERPES ZOSTER 10/18/2008  . SINUSITIS, ACUTE 05/03/2009  . SORE THROAT 02/12/2009  . TMJ SYNDROME 02/11/2009  . NECK PAIN 02/15/2009  . PARESTHESIA 10/30/2009  . PERS HX TOBACCO USE PRESENTING HAZARDS HEALTH 03/10/2010   No past surgical history on file.  reports that she has been smoking Cigarettes.  She has a 24 pack-year smoking history. She has never used smokeless tobacco. She reports that she does not drink alcohol or use illicit drugs. family history includes Arthritis in her father and mother; Cancer in her father and mother; Hyperlipidemia in her father. Allergies  Allergen Reactions  . Iodine Hives and Swelling  . Shellfish Allergy Hives and Swelling     Review of Systems  Constitutional: Negative for fever and chills.       Objective:   Physical Exam  Constitutional: She appears well-developed and well-nourished.  Cardiovascular: Normal rate and regular rhythm.   Pulmonary/Chest: Effort normal and breath sounds normal. No respiratory distress. She has no wheezes. She has no rales.  Musculoskeletal:  Left wrist reveals no erythema or warmth. No ecchymosis. She has some slight prominence of the left Ulnar styloid compared to the right and minimal diffuse tenderness over the wrist joint but no evidence for effusion. Full range of motion.          Assessment & Plan:   Left wrist pain. Differential is acute strain versus other. No injury. No history of gout or pseudogout. Obtain x-rays. If negative, focus on  icing, over-the-counter anti-inflammatory, and wrist splint for the next couple of weeks

## 2015-10-14 NOTE — Patient Instructions (Signed)
Go and get X-ray If negative, try some Advil, icing, and wrist splint for the next couple of weeks.

## 2015-10-14 NOTE — Progress Notes (Signed)
Pre visit review using our clinic review tool, if applicable. No additional management support is needed unless otherwise documented below in the visit note. 

## 2015-10-17 ENCOUNTER — Ambulatory Visit (INDEPENDENT_AMBULATORY_CARE_PROVIDER_SITE_OTHER)
Admission: RE | Admit: 2015-10-17 | Discharge: 2015-10-17 | Disposition: A | Discharge status: Home / Self Care | Payer: BLUE CROSS/BLUE SHIELD | Source: Ambulatory Visit | Attending: Family Medicine | Admitting: Family Medicine

## 2015-10-17 DIAGNOSIS — M25532 Pain in left wrist: Secondary | ICD-10-CM | POA: Diagnosis not present

## 2016-02-10 ENCOUNTER — Ambulatory Visit (INDEPENDENT_AMBULATORY_CARE_PROVIDER_SITE_OTHER): Payer: BLUE CROSS/BLUE SHIELD | Admitting: Family Medicine

## 2016-02-10 VITALS — BP 120/82 | HR 77 | Temp 98.7°F | Ht 62.0 in | Wt 134.7 lb

## 2016-02-10 DIAGNOSIS — L84 Corns and callosities: Secondary | ICD-10-CM | POA: Diagnosis not present

## 2016-02-10 NOTE — Progress Notes (Signed)
Subjective:     Patient ID: Ann Lee, female   DOB: 1955-11-12, 60 y.o.   MRN: [id number]  HPI Right foot pain. Patient has area near the center of the ball of her foot which is very hardened. She had previous squamous cell carcinoma right forearm which we removed earlier this year and she was concerned it may be similar. She has also apparently had history of callus in the past. No recent change of shoe wear. Pain with walking.  No alleviating factors.  Past Medical History:  Diagnosis Date  . HERPES ZOSTER 10/18/2008  . NECK PAIN 02/15/2009  . PARESTHESIA 10/30/2009  . PERS HX TOBACCO USE PRESENTING HAZARDS HEALTH 03/10/2010  . SINUSITIS, ACUTE 05/03/2009  . SORE THROAT 02/12/2009  . TMJ SYNDROME 02/11/2009   No past surgical history on file.  reports that she has been smoking Cigarettes.  She has a 24.00 pack-year smoking history. She has never used smokeless tobacco. She reports that she does not drink alcohol or use drugs. family history includes Arthritis in her father and mother; Cancer in her father and mother; Hyperlipidemia in her father. Allergies  Allergen Reactions  . Iodine Hives and Swelling  . Shellfish Allergy Hives and Swelling     Review of Systems  Musculoskeletal: Negative for gait problem.  Hematological: Negative for adenopathy.       Objective:   Physical Exam  Constitutional: She appears well-developed and well-nourished.  Cardiovascular: Normal rate and regular rhythm.   Pulmonary/Chest: Effort normal and breath sounds normal. No respiratory distress. She has no wheezes. She has no rales.  Skin:  Right foot near center ball of the foot she has a callused area which is about 1 cm diameter and near the center she has a whitened core. No evidence for plantar wart       Assessment:     Callus of the right foot    Plan:     -Recommend trimming this down. Patient consented. Prepped skin with alcohol. Using #15 blade we trimmed down the hardened  callused area as well as the center core. Patient tolerated well and noted immediate relief afterwards -She is aware that these tend to recur over time. We have suggested that she follow-up with podiatrist at some point to consider offloading stress on this region the foot -Schedule complete physical  Eulas Post MD [facility] at [facility]

## 2016-02-10 NOTE — Patient Instructions (Signed)
Corns and Calluses Corns are small areas of thickened skin that occur on the top, sides, or tip of a toe. They contain a cone-shaped core with a point that can press on a nerve below. This causes pain. Calluses are areas of thickened skin that can occur anywhere on the body including hands, fingers, palms, soles of the feet, and heels.Calluses are usually larger than corns.  CAUSES  Corns and calluses are caused by rubbing (friction) or pressure, such as from shoes that are too tight or do not fit properly.  RISK FACTORS Corns are more likely to develop in people who have toe deformities, such as hammer toes. Since calluses can occur with friction to any area of the skin, calluses are more likely to develop in people who:   Work with their hands.  Wear shoes that fit poorly, shoes that are too tight, or shoes that are high-heeled.  Have toes deformities. SYMPTOMS Symptoms of a corn or callus include:  A hard growth on the skin.   Pain or tenderness under the skin.   Redness and swelling.   Increased discomfort while wearing tight-fitting shoes. DIAGNOSIS  Corns and calluses may be diagnosed with a medical history and physical exam.  TREATMENT  Corns and calluses may be treated with:  Removing the cause of the friction or pressure. This may include:  Changing your shoes.  Wearing shoe inserts (orthotics) or other protective layers in your shoes, such as a corn pad.  Wearing gloves.  Medicines to help soften skin in the hardened, thickened areas.  Reducing the size of the corn or callus by removing the dead layers of skin.  Antibiotic medicines to treat infection.  Surgery, if a toe deformity is the cause. HOME CARE INSTRUCTIONS   Take medicines only as directed by your health care provider.  If you were prescribed an antibiotic, finish all of it even if you start to feel better.  Wear shoes that fit well. Avoid wearing high-heeled shoes and shoes that are too tight  or too loose.  Wear any padding, protective layers, gloves, or orthotics as directed by your health care provider.  Soak your hands or feet and then use a file or pumice stone to soften your corn or callus. Do this as directed by your health care provider.  Check your corn or callus every day for signs of infection. Watch for:  Redness, swelling, or pain.  Fluid, blood, or pus. SEEK MEDICAL CARE IF:   Your symptoms do not improve with treatment.  You have increased redness, swelling, or pain at the site of your corn or callus.  You have fluid, blood, or pus coming from your corn or callus.  You have new symptoms.   This information is not intended to replace advice given to you by your health care provider. Make sure you discuss any questions you have with your health care provider.   Document Released: 03/07/2004 Document Revised: 10/16/2014 Document Reviewed: 05/28/2014 Elsevier Interactive Patient Education 2016 Elsevier Inc.  

## 2016-02-10 NOTE — Progress Notes (Signed)
Pre visit review using our clinic review tool, if applicable. No additional management support is needed unless otherwise documented below in the visit note. 

## 2016-05-11 ENCOUNTER — Ambulatory Visit (INDEPENDENT_AMBULATORY_CARE_PROVIDER_SITE_OTHER): Payer: BLUE CROSS/BLUE SHIELD | Admitting: Family Medicine

## 2016-05-11 VITALS — BP 112/80 | HR 85 | Temp 98.2°F | Ht 62.0 in | Wt 134.9 lb

## 2016-05-11 DIAGNOSIS — R21 Rash and other nonspecific skin eruption: Secondary | ICD-10-CM | POA: Diagnosis not present

## 2016-05-11 MED ORDER — DOXYCYCLINE HYCLATE 100 MG PO CAPS: 100.0000 mg | ORAL_CAPSULE | Freq: Two times a day (BID) | ORAL | 0 refills | Status: DC

## 2016-05-11 NOTE — Progress Notes (Signed)
Pre visit review using our clinic review tool, if applicable. No additional management support is needed unless otherwise documented below in the visit note. 

## 2016-05-11 NOTE — Patient Instructions (Signed)
Follow-up immediately for any fever or for any progressive redness or pain

## 2016-05-11 NOTE — Progress Notes (Addendum)
Subjective:     Patient ID: Ann Lee, female   DOB: 04-23-1956, 60 y.o.   MRN: [id number]  HPI Patient seen with erythematous rash left forehead. First noted about 4 to 5 days ago. She denies any injury. No fevers or chills. She's not really had any pain but describes this as a "pressure sensation ". She's not noticed any blistering. No pustules. No headaches.  She does have remote history of MRSA but has not had any recent abscesses. No headache. No visual changes.  Past Medical History:  Diagnosis Date  . HERPES ZOSTER 10/18/2008  . NECK PAIN 02/15/2009  . PARESTHESIA 10/30/2009  . PERS HX TOBACCO USE PRESENTING HAZARDS HEALTH 03/10/2010  . SINUSITIS, ACUTE 05/03/2009  . SORE THROAT 02/12/2009  . TMJ SYNDROME 02/11/2009   No past surgical history on file.  reports that she has been smoking Cigarettes.  She has a 24.00 pack-year smoking history. She has never used smokeless tobacco. She reports that she does not drink alcohol or use drugs. family history includes Arthritis in her father and mother; Cancer in her father and mother; Hyperlipidemia in her father. Allergies  Allergen Reactions  . Iodine Hives and Swelling  . Shellfish Allergy Hives and Swelling     Review of Systems  Constitutional: Negative for chills and fever.  Skin: Positive for rash.       Objective:   Physical Exam  Constitutional: She appears well-developed and well-nourished.  Cardiovascular: Normal rate and regular rhythm.   Pulmonary/Chest: Effort normal and breath sounds normal. No respiratory distress. She has no wheezes. She has no rales.  Skin: Rash noted.  Patient has approximately 2 x 3 cm area of erythema which is slightly raised slightly warm to touch. No vesicles. No pustules. Nonfluctuant.       Assessment:     ?Early cellulitis left forehead. She does not have any vesicles to suggest likely shingles. No evidence for abscess    Plan:     -Doxycycline 100 mg twice daily. -Follow-up  immediately for a fever or progressive erythema  Eulas Post MD [facility]  See phone note- now has new burning rash left eyebrow region. ? Shingles .  No eye involvement.  Start Valtrex 1 gram tid for 7 days.  Eulas Post MD [facility]

## 2016-05-13 ENCOUNTER — Telehealth: Payer: Self-pay | Admitting: Family Medicine

## 2016-05-13 MED ORDER — VALACYCLOVIR HCL 1 G PO TABS: 1000.0000 mg | ORAL_TABLET | Freq: Three times a day (TID) | ORAL | 0 refills | Status: DC

## 2016-05-13 NOTE — Telephone Encounter (Signed)
° ° ° °  Pt call to say that Dr Elease Hashimoto told her that if things were not going well to call back and get an appt. Dr Elease Hashimoto is full today pt would like a call back .Marland Kitchen

## 2016-05-13 NOTE — Telephone Encounter (Signed)
Spoke with patient.  She now has new rash eyebrow region that "burns".  No eye involvement.  Based on this hx, ? Shingles. Will start Valtrex 1 gram tid for 7 days.  Pt knows to follow up immediately for any blurred vision or other concerns.

## 2016-05-13 NOTE — Addendum Note (Signed)
Addended by: Eulas Post on: 05/13/2016 10:12 AM   Modules accepted: Orders

## 2016-07-07 ENCOUNTER — Ambulatory Visit (INDEPENDENT_AMBULATORY_CARE_PROVIDER_SITE_OTHER): Payer: BLUE CROSS/BLUE SHIELD | Admitting: Family Medicine

## 2016-07-07 VITALS — BP 100/70 | HR 100 | Temp 98.4°F | Ht 62.0 in | Wt 136.0 lb

## 2016-07-07 DIAGNOSIS — L853 Xerosis cutis: Secondary | ICD-10-CM | POA: Diagnosis not present

## 2016-07-07 DIAGNOSIS — F5102 Adjustment insomnia: Secondary | ICD-10-CM | POA: Diagnosis not present

## 2016-07-07 DIAGNOSIS — G43909 Migraine, unspecified, not intractable, without status migrainosus: Secondary | ICD-10-CM

## 2016-07-07 DIAGNOSIS — L84 Corns and callosities: Secondary | ICD-10-CM | POA: Diagnosis not present

## 2016-07-07 MED ORDER — TRIAMCINOLONE ACETONIDE 0.1 % EX LOTN: 1.0000 "application " | TOPICAL_LOTION | Freq: Two times a day (BID) | CUTANEOUS | 1 refills | Status: DC | PRN

## 2016-07-07 MED ORDER — SUMATRIPTAN SUCCINATE 100 MG PO TABS: 100.0000 mg | ORAL_TABLET | ORAL | 2 refills | Status: DC | PRN

## 2016-07-07 NOTE — Progress Notes (Signed)
Pre visit review using our clinic review tool, if applicable. No additional management support is needed unless otherwise documented below in the visit note. 

## 2016-07-07 NOTE — Progress Notes (Signed)
Subjective:     Patient ID: Ann Lee, female   DOB: 18-Sep-1955, 61 y.o.   MRN: [id number]  HPI Patient here today to discuss multiple issues  She has painful callus on the ball for right foot and requests this be trimmed. Which in this about 6 months ago and she got some good benefit  History of migraine headaches. She is dealing with the stress of her sister who is dying of lung cancer. Generally takes one half of Imitrex 100 mg that seems to work well for her. She thinks that one of her trigger distress and the other change in barometric pressure.  She has history of very dry scan and frequent pruritus lower legs. His used triamcinolone lotion in the past which works well. Requesting refills.  Recent insomnia. She thinks this is mostly stress related. She is able to fall asleep but generally wakes up within about an hour. No alcohol use. No late day caffeine use.  Past Medical History:  Diagnosis Date  . HERPES ZOSTER 10/18/2008  . NECK PAIN 02/15/2009  . PARESTHESIA 10/30/2009  . PERS HX TOBACCO USE PRESENTING HAZARDS HEALTH 03/10/2010  . SINUSITIS, ACUTE 05/03/2009  . SORE THROAT 02/12/2009  . TMJ SYNDROME 02/11/2009   No past surgical history on file.  reports that she has been smoking Cigarettes.  She has a 24.00 pack-year smoking history. She has never used smokeless tobacco. She reports that she does not drink alcohol or use drugs. family history includes Arthritis in her father and mother; Cancer in her father and mother; Hyperlipidemia in her father. Allergies  Allergen Reactions  . Iodine Hives and Swelling  . Shellfish Allergy Hives and Swelling     Review of Systems  Constitutional: Negative for fatigue.  Eyes: Negative for visual disturbance.  Respiratory: Negative for cough, chest tightness, shortness of breath and wheezing.   Cardiovascular: Negative for chest pain, palpitations and leg swelling.  Neurological: Positive for headaches. Negative for dizziness,  seizures, syncope, weakness and light-headedness.  Psychiatric/Behavioral: Positive for sleep disturbance.       Objective:   Physical Exam  Constitutional: She is oriented to person, place, and time. She appears well-developed and well-nourished.  Cardiovascular: Normal rate and regular rhythm.   Pulmonary/Chest: Effort normal and breath sounds normal. No respiratory distress. She has no wheezes. She has no rales.  Musculoskeletal: She exhibits no edema.  Neurological: She is alert and oriented to person, place, and time. No cranial nerve deficit.  Skin:  Right foot small callus ball of the foot of the mid aspect  Psychiatric: She has a normal mood and affect. Her behavior is normal.       Assessment:     #1 callus right foot  #2 transient insomnia probably related to stress of sister's illness.  #3 migraine headaches  #4 dry skin    Plan:     -Refill Imitrex for as needed use -Refill Kenalog lotion for as needed use -Sleep hygiene discussed with handout given -Recommend trial of Tylenol PM first we trimmed right foot callus with #15 blade. She noticed some immediate relief afterwards  Eulas Post MD [facility]  -

## 2016-07-07 NOTE — Patient Instructions (Signed)
Insomnia Insomnia is a sleep disorder that makes it difficult to fall asleep or to stay asleep. Insomnia can cause tiredness (fatigue), low energy, difficulty concentrating, mood swings, and poor performance at work or school. There are three different ways to classify insomnia:  Difficulty falling asleep.  Difficulty staying asleep.  Waking up too early in the morning. Any type of insomnia can be long-term (chronic) or short-term (acute). Both are common. Short-term insomnia usually lasts for three months or less. Chronic insomnia occurs at least three times a week for longer than three months. What are the causes? Insomnia may be caused by another condition, situation, or substance, such as:  Anxiety.  Certain medicines.  Gastroesophageal reflux disease (GERD) or other gastrointestinal conditions.  Asthma or other breathing conditions.  Restless legs syndrome, sleep apnea, or other sleep disorders.  Chronic pain.  Menopause. This may include hot flashes.  Stroke.  Abuse of alcohol, tobacco, or illegal drugs.  Depression.  Caffeine.  Neurological disorders, such as Alzheimer disease.  An overactive thyroid (hyperthyroidism). The cause of insomnia may not be known. What increases the risk? Risk factors for insomnia include:  Gender. Women are more commonly affected than men.  Age. Insomnia is more common as you get older.  Stress. This may involve your professional or personal life.  Income. Insomnia is more common in people with lower income.  Lack of exercise.  Irregular work schedule or night shifts.  Traveling between different time zones. What are the signs or symptoms? If you have insomnia, trouble falling asleep or trouble staying asleep is the main symptom. This may lead to other symptoms, such as:  Feeling fatigued.  Feeling nervous about going to sleep.  Not feeling rested in the morning.  Having trouble concentrating.  Feeling irritable,  anxious, or depressed. How is this treated? Treatment for insomnia depends on the cause. If your insomnia is caused by an underlying condition, treatment will focus on addressing the condition. Treatment may also include:  Medicines to help you sleep.  Counseling or therapy.  Lifestyle adjustments. Follow these instructions at home:  Take medicines only as directed by your health care provider.  Keep regular sleeping and waking hours. Avoid naps.  Keep a sleep diary to help you and your health care provider figure out what could be causing your insomnia. Include:  When you sleep.  When you wake up during the night.  How well you sleep.  How rested you feel the next day.  Any side effects of medicines you are taking.  What you eat and drink.  Make your bedroom a comfortable place where it is easy to fall asleep:  Put up shades or special blackout curtains to block light from outside.  Use a white noise machine to block noise.  Keep the temperature cool.  Exercise regularly as directed by your health care provider. Avoid exercising right before bedtime.  Use relaxation techniques to manage stress. Ask your health care provider to suggest some techniques that may work well for you. These may include:  Breathing exercises.  Routines to release muscle tension.  Visualizing peaceful scenes.  Cut back on alcohol, caffeinated beverages, and cigarettes, especially close to bedtime. These can disrupt your sleep.  Do not overeat or eat spicy foods right before bedtime. This can lead to digestive discomfort that can make it hard for you to sleep.  Limit screen use before bedtime. This includes:  Watching TV.  Using your smartphone, tablet, and computer.  Stick to a   routine. This can help you fall asleep faster. Try to do a quiet activity, brush your teeth, and go to bed at the same time each night.  Get out of bed if you are still awake after 15 minutes of trying to  sleep. Keep the lights down, but try reading or doing a quiet activity. When you feel sleepy, go back to bed.  Make sure that you drive carefully. Avoid driving if you feel very sleepy.  Keep all follow-up appointments as directed by your health care provider. This is important. Contact a health care provider if:  You are tired throughout the day or have trouble in your daily routine due to sleepiness.  You continue to have sleep problems or your sleep problems get worse. Get help right away if:  You have serious thoughts about hurting yourself or someone else. This information is not intended to replace advice given to you by your health care provider. Make sure you discuss any questions you have with your health care provider. Document Released: 05/29/2000 Document Revised: 11/01/2015 Document Reviewed: 03/02/2014 Elsevier Interactive Patient Education  2017 [employer].  Consider trial of Tylenol PM for insomnia.

## 2016-11-10 ENCOUNTER — Encounter: Payer: Self-pay | Admitting: Family Medicine

## 2016-11-10 ENCOUNTER — Ambulatory Visit (INDEPENDENT_AMBULATORY_CARE_PROVIDER_SITE_OTHER): Payer: BLUE CROSS/BLUE SHIELD | Admitting: Family Medicine

## 2016-11-10 VITALS — BP 110/70 | HR 66 | Temp 98.3°F | Wt 135.4 lb

## 2016-11-10 DIAGNOSIS — L255 Unspecified contact dermatitis due to plants, except food: Secondary | ICD-10-CM

## 2016-11-10 DIAGNOSIS — Z122 Encounter for screening for malignant neoplasm of respiratory organs: Secondary | ICD-10-CM | POA: Diagnosis not present

## 2016-11-10 DIAGNOSIS — L84 Corns and callosities: Secondary | ICD-10-CM

## 2016-11-10 MED ORDER — PREDNISONE 10 MG PO TABS: ORAL_TABLET | ORAL | 0 refills | Status: DC

## 2016-11-10 NOTE — Progress Notes (Signed)
Subjective:     Patient ID: Ann Lee, female   DOB: 02/03/1956, 61 y.o.   MRN: [id number]  HPI Patient seen for multiple issues  First issue is new issue of blistery pruritic rash on her forearms. She did a lot of yard work one week ago then noted rash several days ago. She has tried topical hydrocortisone cream along with oral Benadryl and topical calamine without much improvement. No other rash noted. No fevers or chills.  Recurrent callus involving her right foot. This was trimmed previously with #15 blade which helped. She is requesting the same today.  Patient has long history of smoking and is a current smoker. Over 30 pack year history. She is 72 and she would like to consider low-dose CT lung cancer screening. Her older sister was diagnosed with lung cancer about a year and a half ago. Patient is asymptomatic.  Past Medical History:  Diagnosis Date  . HERPES ZOSTER 10/18/2008  . NECK PAIN 02/15/2009  . PARESTHESIA 10/30/2009  . PERS HX TOBACCO USE PRESENTING HAZARDS HEALTH 03/10/2010  . SINUSITIS, ACUTE 05/03/2009  . SORE THROAT 02/12/2009  . TMJ SYNDROME 02/11/2009   No past surgical history on file.  reports that she has been smoking Cigarettes.  She has a 24.00 pack-year smoking history. She has never used smokeless tobacco. She reports that she does not drink alcohol or use drugs. family history includes Arthritis in her father and mother; Cancer in her father and mother; Hyperlipidemia in her father. Allergies  Allergen Reactions  . Iodine Hives and Swelling  . Shellfish Allergy Hives and Swelling     Review of Systems  Constitutional: Negative for appetite change and unexpected weight change.  Respiratory: Negative for cough and shortness of breath.   Cardiovascular: Negative for chest pain.  Skin: Positive for rash.  Hematological: Negative for adenopathy.       Objective:   Physical Exam  Constitutional: She appears well-developed and well-nourished.  Neck:  Neck supple.  Cardiovascular: Normal rate and regular rhythm.   Pulmonary/Chest: Effort normal and breath sounds normal. No respiratory distress. She has no wheezes. She has no rales.  Skin: Rash noted.  Patient has some clear vesicles especially left forearm. Drying up on the right forearm. No signs of secondary infection.  She has 2 areas of callus plantar aspect right foot. These are tender to palpation. No evidence for plantar wart.       Assessment:     #1 contact dermatitis involving forearms  #2 painful callus right foot  #3 ongoing nicotine use with patient requesting low-dose CT lung cancer screening    Plan:     -Prednisone taper regarding her contact dermatitis -Using #15 blade we trimmed painful callus right foot-she note improvement afterwards. -Set up low-dose CT lung cancer screening  Eulas Post MD [facility]

## 2016-11-10 NOTE — Patient Instructions (Addendum)
WE NOW OFFER   [facility] FAST TRACK!!!  SAME DAY Appointments for ACUTE CARE  Such as: Sprains, Injuries, cuts, abrasions, rashes, muscle pain, joint pain, back pain Colds, flu, sore throats, headache, allergies, cough, fever  Ear pain, sinus and eye infections Abdominal pain, nausea, vomiting, diarrhea, upset stomach Animal/insect bites  3 Easy Ways to Schedule: Walk-In Scheduling Call in scheduling Mychart Sign-up: [email or web address]         Contact Dermatitis Dermatitis is redness, soreness, and swelling (inflammation) of the skin. Contact dermatitis is a reaction to certain substances that touch the skin. There are two types of contact dermatitis:  Irritant contact dermatitis. This type is caused by something that irritates your skin, such as dry hands from washing them too much. This type does not require previous exposure to the substance for a reaction to occur. This type is more common.  Allergic contact dermatitis. This type is caused by a substance that you are allergic to, such as a nickel allergy or poison ivy. This type only occurs if you have been exposed to the substance (allergen) before. Upon a repeat exposure, your body reacts to the substance. This type is less common. What are the causes? Many different substances can cause contact dermatitis. Irritant contact dermatitis is most commonly caused by exposure to:  Makeup.  Soaps.  Detergents.  Bleaches.  Acids.  Metal salts, such as nickel. Allergic contact dermatitis is most commonly caused by exposure to:  Poisonous plants.  Chemicals.  Jewelry.  Latex.  Medicines.  Preservatives in products, such as clothing. What increases the risk? This condition is more likely to develop in:  People who have jobs that expose them to irritants or allergens.  People who have certain medical conditions, such as asthma or eczema. What are the signs or symptoms? Symptoms of  this condition may occur anywhere on your body where the irritant has touched you or is touched by you. Symptoms include:  Dryness or flaking.  Redness.  Cracks.  Itching.  Pain or a burning feeling.  Blisters.  Drainage of small amounts of blood or clear fluid from skin cracks. With allergic contact dermatitis, there may also be swelling in areas such as the eyelids, mouth, or genitals. How is this diagnosed? This condition is diagnosed with a medical history and physical exam. A patch skin test may be performed to help determine the cause. If the condition is related to your job, you may need to see an occupational medicine specialist. How is this treated? Treatment for this condition includes figuring out what caused the reaction and protecting your skin from further contact. Treatment may also include:  Steroid creams or ointments. Oral steroid medicines may be needed in more severe cases.  Antibiotics or antibacterial ointments, if a skin infection is present.  Antihistamine lotion or an antihistamine taken by mouth to ease itching.  A bandage (dressing). Follow these instructions at home: [place] your skin as needed.  Apply cool compresses to the affected areas.  Try taking a bath with:  Epsom salts. Follow the instructions on the packaging. You can get these at your local pharmacy or grocery store.  Baking soda. Pour a small amount into the bath as directed by your health care provider.  Colloidal oatmeal. Follow the instructions on the packaging. You can get this at your local pharmacy or grocery store.  Try applying baking soda paste to your skin. Stir water into baking soda until it reaches a paste-like consistency.  Do not scratch your skin.  Bathe less frequently, such as every other day.  Bathe in lukewarm water. Avoid using hot water. Medicines   Take or apply over-the-counter and prescription medicines only as told by your health care  provider.  If you were prescribed an antibiotic medicine, take or apply your antibiotic as told by your health care provider. Do not stop using the antibiotic even if your condition starts to improve. General instructions   Keep all follow-up visits as told by your health care provider. This is important.  Avoid the substance that caused your reaction. If you do not know what caused it, keep a journal to try to track what caused it. Write down:  What you eat.  What cosmetic products you use.  What you drink.  What you wear in the affected area. This includes jewelry.  If you were given a dressing, take care of it as told by your health care provider. This includes when to change and remove it. Contact a health care provider if:  Your condition does not improve with treatment.  Your condition gets worse.  You have signs of infection such as swelling, tenderness, redness, soreness, or warmth in the affected area.  You have a fever.  You have new symptoms. Get help right away if:  You have a severe headache, neck pain, or neck stiffness.  You vomit.  You feel very sleepy.  You notice red streaks coming from the affected area.  Your bone or joint underneath the affected area becomes painful after the skin has healed.  The affected area turns darker.  You have difficulty breathing. This information is not intended to replace advice given to you by your health care provider. Make sure you discuss any questions you have with your health care provider. Document Released: 05/29/2000 Document Revised: 11/07/2015 Document Reviewed: 10/17/2014 Elsevier Interactive Patient Education  2017 [employer].

## 2016-11-12 ENCOUNTER — Other Ambulatory Visit: Payer: Self-pay | Admitting: Acute Care

## 2016-11-12 DIAGNOSIS — F17210 Nicotine dependence, cigarettes, uncomplicated: Secondary | ICD-10-CM

## 2016-11-16 ENCOUNTER — Encounter: Payer: Self-pay | Admitting: Family Medicine

## 2016-11-16 ENCOUNTER — Ambulatory Visit (INDEPENDENT_AMBULATORY_CARE_PROVIDER_SITE_OTHER): Payer: BLUE CROSS/BLUE SHIELD | Admitting: Family Medicine

## 2016-11-16 VITALS — BP 128/80 | HR 70 | Temp 98.4°F | Resp 12 | Ht 62.0 in | Wt 138.0 lb

## 2016-11-16 DIAGNOSIS — K118 Other diseases of salivary glands: Secondary | ICD-10-CM

## 2016-11-16 DIAGNOSIS — R509 Fever, unspecified: Secondary | ICD-10-CM | POA: Diagnosis not present

## 2016-11-16 DIAGNOSIS — Z20818 Contact with and (suspected) exposure to other bacterial communicable diseases: Secondary | ICD-10-CM

## 2016-11-16 LAB — POCT RAPID STREP A (OFFICE): Rapid Strep A Screen: NEGATIVE

## 2016-11-16 NOTE — Patient Instructions (Addendum)
  [name] I have seen you today for an acute visit.  A few things to remember from today's visit:   Sore throat - Plan: POC Rapid Strep A  Fever, unspecified  Acute pharyngitis, unspecified etiology  viral infections are self-limited and we treat each symptom depending of severity.  Over the counter medications as decongestants and cold medications usually help, they need to be taken with caution if there is a history of high blood pressure or palpitations. Tylenol and/or Ibuprofen also helps with most symptoms (headache, muscle aching, fever,etc) Plenty of fluids. Honey helps with cough. Steam inhalations helps if runny nose, nasal congestion, and may prevent sinus infections. Cough and nasal congestion can start in the next few days and could last a few days and sometimes weeks.  Smoking cessation recommended, if cough or congestion starts they can last longer and increases risk of pneumonia.   Monitor for new symptoms.   In general please monitor for signs of worsening symptoms and seek immediate medical attention if any concerning.  If symptoms are not resolved in 1-2 weeks you should schedule a follow up appointment with your doctor, before if needed.  Please be sure you have an appointment already scheduled with your PCP before you leave today.

## 2016-11-16 NOTE — Progress Notes (Signed)
HPI:   ACUTE VISIT:  Chief Complaint  Patient presents with  . Sore Throat    [name] is a 61 y.o. female, who is here today complaining of a day of fever.  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 She denies sore throat or oral lesions, she feels swollen glands and has bilateral earche.  Fever 101.4 F that started yesterday afternoon. She took Tylenol today.  + Smoker.   Fever   This is a new problem. The current episode started yesterday. The problem has been unchanged. The maximum temperature noted was 101 to 101.9 F. Associated symptoms include ear pain and a rash (contact dermatitis (poison ivy)). Pertinent negatives include no abdominal pain, congestion, coughing, diarrhea, headaches, muscle aches, nausea, sleepiness, sore throat, urinary pain, vomiting or wheezing. The treatment provided mild relief.  Risk factors: sick contacts   Risk factors: no contaminated food, no immunosuppression and no recent travel    She deneis recent travel or insect bite. Recently seen for poison ive contact dermatitis,currently on Prednisone. She has areas wrapped but reports that lesions are healing well,no pain reported.   Review of Systems  Constitutional: Positive for fatigue and fever. Negative for activity change, appetite change and chills.  HENT: Positive for ear pain. Negative for congestion, ear discharge, facial swelling, mouth sores, postnasal drip, rhinorrhea, sore throat, trouble swallowing and voice change.   Eyes: Negative for discharge and redness.  Respiratory: Negative for cough, chest tightness, shortness of breath and wheezing.   Gastrointestinal: Negative for abdominal pain, diarrhea, nausea and vomiting.  Genitourinary: Negative for decreased urine volume, dysuria and hematuria.  Musculoskeletal: Negative for gait problem and myalgias.    Skin: Positive for rash (contact dermatitis (poison ivy)). Negative for pallor.  Neurological: Negative for weakness and headaches.  Hematological: Negative for adenopathy. Does not bruise/bleed easily.  Psychiatric/Behavioral: Negative for confusion. The patient is nervous/anxious.      Current Outpatient Prescriptions on File Prior to Visit  Medication Sig Dispense Refill  . SUMAtriptan (IMITREX) 100 MG tablet Take 1 tablet (100 mg total) by mouth every 2 (two) hours as needed for migraine. May repeat in 2 hours if headache persists or recurs. 9 tablet 2  . triamcinolone lotion (KENALOG) 0.1 % Apply 1 application topically 2 (two) times daily as needed. 60 mL 1   No current facility-administered medications on file prior to visit.      Past Medical History:  Diagnosis Date  . HERPES ZOSTER 10/18/2008  . NECK PAIN 02/15/2009  . PARESTHESIA 10/30/2009  . PERS HX TOBACCO USE PRESENTING HAZARDS HEALTH 03/10/2010  . SINUSITIS, ACUTE 05/03/2009  . SORE THROAT 02/12/2009  . TMJ SYNDROME 02/11/2009   Allergies  Allergen Reactions  . Iodine Hives and Swelling  . Shellfish Allergy Hives and Swelling    Social History   Social History  . Marital status: Divorced    Spouse name: N/A  . Number of children: N/A  . Years of education: N/A   Social History Main Topics  . Smoking status: Current Every Day Smoker    Packs/day: 0.75    Years: 32.00    Types: Cigarettes  . Smokeless tobacco: Never Used  . Alcohol use No  . Drug use: No  . Sexual activity: Not Asked   Other Topics Concern  . None   Social History Narrative  .  None    Vitals:   11/16/16 0815  BP: 128/80  Pulse: 70  Resp: 12  Temp: 98.4 F (36.9 C)  O2 sat at RA 98% Body mass index is 25.24 kg/m.   Physical Exam  Nursing note and vitals reviewed. Constitutional: She is oriented to person, place, and time. She appears well-developed and well-nourished. She does not appear ill. No distress.  HENT:  Head:  Atraumatic.  Right Ear: Tympanic membrane, external ear and ear canal normal.  Left Ear: Tympanic membrane, external ear and ear canal normal.  Mouth/Throat: Oropharynx is clear and moist and mucous membranes are normal. Dental caries present.  Eyes: Conjunctivae are normal.  Neck: No muscular tenderness present. No edema and no erythema present.  Cardiovascular: Normal rate and regular rhythm.   No murmur heard. Respiratory: Effort normal and breath sounds normal. No stridor. No respiratory distress.  Lymphadenopathy:       Head (right side): No submandibular adenopathy present.       Head (left side): No submandibular adenopathy present.    She has no cervical adenopathy.  Tender submandibular bilateral but not enlarged.  Neurological: She is alert and oriented to person, place, and time. She has normal strength.  Skin: Skin is warm. No rash noted. No erythema.  Psychiatric: Her speech is normal. Her mood appears anxious.  Fairly groomed, good eye contact.    ASSESSMENT AND PLAN:   Ann Lee was seen today for sore throat.  Diagnoses and all orders for this visit:  Fever, unspecified  Plan discussed possible etiologies, it seems like she has been exposed to different illnesses, including strep but no sore throat at this time. Monitor for new symptoms.  Continue Tylenol OTC 3 times a day as needed. Adequate hydration.   Instructed not to be around immunosuppressed people and avoid taking care of children until 24 hours fever free. We will follow strep culture and treat accordingly.   Submandibular gland tenderness  Explained that this could be the beginning of a viral illness. Recommend smoking cessation.  -     POC Rapid Strep A -     Culture, Group A Strep  Exposure to strep throat  She is not having sore throat at this time and examination today does not suggest active strep infection. Rapid strep negative. Cx sent.  We will follow with her symptoms at the time  throat Cx is back.  -     POC Rapid Strep A     -[name] was advised to seek immediate medical attention is symptoms suddenly get worse or to follow if symptoms persist or new concerns arise.     Akaash Vandewater G. [place], MD  [facility]. [place] office.

## 2016-11-18 ENCOUNTER — Telehealth: Payer: Self-pay | Admitting: Family Medicine

## 2016-11-18 LAB — CULTURE, GROUP A STREP

## 2016-11-18 NOTE — Telephone Encounter (Signed)
Please advise 

## 2016-11-18 NOTE — Telephone Encounter (Signed)
Called and spoke with patient. She is saying her throat does not hurt, but she feels like she has marbles in her throat when she swallows. Her fever has been as high as 102 and she hasn't been able to get out of bed.

## 2016-11-18 NOTE — Telephone Encounter (Signed)
Rapid strep and throat Cx negative, I still think that it is most likely viral and symptomatic treatment is recommended usually. Mouth wash with Lidocaine can be called into her pharmacy for pain controlled.   If Monday she is still having fever (100 F or more) and/or sore throat, we could repeat rapid strep and do CBC. If worsening symptoms she may need to follow with her PCP to auscultate her lungs, given her Hx of tobacco use she has risk of pneumonia.  Thanks, BJ

## 2016-11-18 NOTE — Telephone Encounter (Signed)
Pt calling wanting her lab results and state that she has not been able to get out of bed since she left the office on Monday 11/16/16 she has been experiencing a high fever and throat is much worse since she left.  Pt wanted to see if she could get something.

## 2016-11-20 ENCOUNTER — Ambulatory Visit (INDEPENDENT_AMBULATORY_CARE_PROVIDER_SITE_OTHER)
Admission: RE | Admit: 2016-11-20 | Discharge: 2016-11-20 | Disposition: A | Discharge status: Home / Self Care | Payer: BLUE CROSS/BLUE SHIELD | Source: Ambulatory Visit | Attending: Acute Care | Admitting: Acute Care

## 2016-11-20 ENCOUNTER — Encounter: Payer: Self-pay | Admitting: Acute Care

## 2016-11-20 ENCOUNTER — Ambulatory Visit (INDEPENDENT_AMBULATORY_CARE_PROVIDER_SITE_OTHER): Payer: BLUE CROSS/BLUE SHIELD | Admitting: Acute Care

## 2016-11-20 DIAGNOSIS — Z87891 Personal history of nicotine dependence: Secondary | ICD-10-CM | POA: Diagnosis not present

## 2016-11-20 DIAGNOSIS — F17210 Nicotine dependence, cigarettes, uncomplicated: Secondary | ICD-10-CM

## 2016-11-20 NOTE — Telephone Encounter (Addendum)
Spoke with patient. She was very upset that her phone call wasn't returned on Wednesday or Thursday. She denies any n/v, but still having fever. She did end up going to another doctor yesterday and getting Amoxicillin. I did apologize to the patient, and asked how the medication was doing. She said that she is feeling a bit better with the antibiotic. Offered to go ahead and make her a follow up appointment with Dr. Elease Hashimoto so she wouldn't have to call back. Patient is scheduled for 3:15 on Tuesday with Dr. Elease Hashimoto.

## 2016-11-20 NOTE — Telephone Encounter (Signed)
LMTCB Pt can talk to Judson Roch

## 2016-11-20 NOTE — Progress Notes (Signed)
Shared Decision Making Visit Lung Cancer Screening Program [phone])   Eligibility:  Age 61 y.o.  Pack Years Smoking History Calculation 41 pack year smoking history (# packs/per year x # years smoked)  Recent History of coughing up blood  no  Unexplained weight loss? no ( >Than 15 pounds within the last 6 months )  Prior History Lung / other cancer no (Diagnosis within the last 5 years already requiring surveillance chest CT Scans).  Smoking Status Current Smoker  Former Smokers: Years since quit: Not applicable  Quit Date: Not applicable  Visit Components:  Discussion included one or more decision making aids. yes  Discussion included risk/benefits of screening. yes  Discussion included potential follow up diagnostic testing for abnormal scans. yes  Discussion included meaning and risk of over diagnosis. yes  Discussion included meaning and risk of False Positives. yes  Discussion included meaning of total radiation exposure. yes  Counseling Included:  Importance of adherence to annual lung cancer LDCT screening. yes  Impact of comorbidities on ability to participate in the program. yes  Ability and willingness to under diagnostic treatment. yes  Smoking Cessation Counseling:  Current Smokers:   Discussed importance of smoking cessation. yes  Information about tobacco cessation classes and interventions provided to patient. yes  Patient provided with "ticket" for LDCT Scan. yes  Symptomatic Patient. no  Counseling  Diagnosis Code: Tobacco Use Z72.0  Asymptomatic Patient yes  Counseling (Intermediate counseling: > three minutes counseling) U9323  Former Smokers:   Discussed the importance of maintaining cigarette abstinence. yes  Diagnosis Code: Personal History of Nicotine Dependence. F57.322  Information about tobacco cessation classes and interventions provided to patient. Yes  Patient provided with "ticket" for LDCT Scan. yes  Written Order  for Lung Cancer Screening with LDCT placed in Epic. Yes (CT Chest Lung Cancer Screening Low Dose W/O CM) [id number] Z12.2-Screening of respiratory organs Z87.891-Personal history of nicotine dependence  .I have spent 25 minutes of face to face time with Ann Lee  discussing the risks and benefits of lung cancer screening. We viewed a power point together that explained in detail the above noted topics. We paused at intervals to allow for questions to be asked and answered to ensure understanding.We discussed that the single most powerful action that she can take to decrease her risk of developing lung cancer is to quit smoking. We discussed whether or not she is ready to commit to setting a quit date. She is currently not ready to set a quit date. She and her sister planning on working together to quit smoking. They have a sister who is dying of lung cancer. We discussed options for tools to aid in quitting smoking including nicotine replacement therapy, non-nicotine medications, support groups, Quit Smart classes, and behavior modification. We discussed that often times setting smaller, more achievable goals, such as eliminating 1 cigarette a day for a week and then 2 cigarettes a day for a week can be helpful in slowly decreasing the number of cigarettes smoked. This allows for a sense of accomplishment as well as providing a clinical benefit. I gave her the " Be Stronger Than Your Excuses" card with contact information for community resources, classes, free nicotine replacement therapy, and access to mobile apps, text messaging, and on-line smoking cessation help. I have also given Ann Lee my card and contact information in the event she needs to contact me. We discussed the time and location of the scan, and that either Doroteo Glassman RN or I  will call with the results within 24-48 hours of receiving them. I have offered her  a copy of the power point we viewed  as a resource in the event they need  reinforcement of the concepts we discussed today in the office. The patient verbalized understanding of all of  the above and had no further questions upon leaving the office. They have my contact information in the event they have any further questions.  I spent 3 minutes counseling on smoking cessation and the health risks of continued tobacco abuse.  I explained to the patient that there has been a high incidence of coronary artery disease noted on these exams. I explained that this is a non-gated exam therefore degree or severity cannot be determined. This patient is not currently on statin therapy. I have asked the patient to follow-up with their PCP regarding any incidental finding of coronary artery disease and management with diet or medication as their PCP  feels is clinically indicated. The patient verbalized understanding of the above and had no further questions upon completion of the visit.     Magdalen Spatz, NP 11/20/2016

## 2016-11-20 NOTE — Telephone Encounter (Signed)
Continue checking temp, plenty of fluids, rest, and monitor for new symptoms. Follow if needed.  Thanks,

## 2016-11-23 ENCOUNTER — Ambulatory Visit (INDEPENDENT_AMBULATORY_CARE_PROVIDER_SITE_OTHER): Payer: BLUE CROSS/BLUE SHIELD | Admitting: Family Medicine

## 2016-11-23 ENCOUNTER — Encounter: Payer: Self-pay | Admitting: Family Medicine

## 2016-11-23 VITALS — BP 110/70 | HR 72 | Temp 98.2°F | Wt 134.7 lb

## 2016-11-23 DIAGNOSIS — R509 Fever, unspecified: Secondary | ICD-10-CM

## 2016-11-23 NOTE — Patient Instructions (Signed)
Continue to monitor for any fever Follow up for any new rash or other new symptoms.

## 2016-11-23 NOTE — Progress Notes (Signed)
Subjective:     Patient ID: Ann Lee, female   DOB: 1955/07/27, 61 y.o.   MRN: [id number]  HPI   Patient seen for follow-up regarding recent rash and fever. She was seen here late May with presumed contact dermatitis. She was started on prednisone. She was seen here on June 4 with fever and reported sore throat though patient denies sore throat in retrospect. She states she was having some neck pain. Rapid strep testing was negative and throat culture was also negative. She recalls having some mild body aches.  On today's exam she states she's not had any sore throat whatsoever. Rash on her forearms which was initially vesicular rash and is now drying up. 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 She has some soreness in her neck and upper back but no stiffness. She had the symptoms even with taking the prednisone. Her sister had some amoxicillin and she states she took a few days of that and symptoms seem to be improving. She's not had any document of fever past few days now.  Past Medical History:  Diagnosis Date  . HERPES ZOSTER 10/18/2008  . NECK PAIN 02/15/2009  . PARESTHESIA 10/30/2009  . PERS HX TOBACCO USE PRESENTING HAZARDS HEALTH 03/10/2010  . SINUSITIS, ACUTE 05/03/2009  . SORE THROAT 02/12/2009  . TMJ SYNDROME 02/11/2009   No past surgical history on file.  reports that she has been smoking Cigarettes.  She has a 41.00 pack-year smoking history. She has never used smokeless tobacco. She reports that she does not drink alcohol or use drugs. family history includes Arthritis in her father and mother; Cancer in her father and mother; Hyperlipidemia in her father. Allergies  Allergen Reactions  . Iodine Hives and Swelling  . Shellfish Allergy Hives and Swelling     Review of Systems  Constitutional: Positive for fatigue. Negative for chills.  HENT: Negative for sore throat.   Respiratory: Negative for cough.   Gastrointestinal: Negative for  abdominal pain.  Genitourinary: Negative for dysuria.  Musculoskeletal: Negative for back pain.  Skin: Positive for rash.       Objective:   Physical Exam  Constitutional: She appears well-developed and well-nourished.  Neck: Neck supple. No thyromegaly present.  Cardiovascular: Normal rate and regular rhythm.   Pulmonary/Chest: Effort normal and breath sounds normal. No respiratory distress. She has no wheezes. She has no rales.  Musculoskeletal: She exhibits no edema.  Skin: Rash noted.  She has some dryness and scaling involving both forearms. rash appears to be drying up. No pustules.       Assessment:     Patient presents with recent fever which appears to be resolving. She describes some achiness of her upper back and neck and question if she had recent viral issue. Polymyalgia rheumatica would be highly unlikely given the fact she had the symptoms even while taking prednisone. Her contact dermatitis seems to be resolving. Patient is concerned regarding her prolonged fatigue and intermittent night sweats    Plan:     -Check CBC with differential. Keep in mind that her white count may be slightly elevated because of recent prednisone -Monitor closely for any recurrent fever-or other new symptoms -no clear indication for antibiotics at this time.  Eulas Post MD [facility] at [facility]

## 2016-11-24 ENCOUNTER — Ambulatory Visit: Payer: BLUE CROSS/BLUE SHIELD | Admitting: Family Medicine

## 2016-11-24 LAB — CBC WITH DIFFERENTIAL/PLATELET
BASOS ABS: 0 10*3/uL (ref 0.0–0.1)
Basophils Relative: 0.5 % (ref 0.0–3.0)
Eosinophils Absolute: 0.2 10*3/uL (ref 0.0–0.7)
Eosinophils Relative: 2.1 % (ref 0.0–5.0)
HEMATOCRIT: 38.7 % (ref 36.0–46.0)
HEMOGLOBIN: 12.8 g/dL (ref 12.0–15.0)
LYMPHS PCT: 16.8 % (ref 12.0–46.0)
Lymphs Abs: 1.7 10*3/uL (ref 0.7–4.0)
MCHC: 33 g/dL (ref 30.0–36.0)
MCV: 89.2 fl (ref 78.0–100.0)
MONOS PCT: 7.8 % (ref 3.0–12.0)
Monocytes Absolute: 0.8 10*3/uL (ref 0.1–1.0)
Neutro Abs: 7.4 10*3/uL (ref 1.4–7.7)
Neutrophils Relative %: 72.8 % (ref 43.0–77.0)
PLATELETS: 251 10*3/uL (ref 150.0–400.0)
RBC: 4.34 Mil/uL (ref 3.87–5.11)
RDW: 13.6 % (ref 11.5–15.5)
WBC: 10.1 10*3/uL (ref 4.0–10.5)

## 2016-11-26 ENCOUNTER — Other Ambulatory Visit: Payer: Self-pay | Admitting: Acute Care

## 2016-11-26 DIAGNOSIS — F17210 Nicotine dependence, cigarettes, uncomplicated: Secondary | ICD-10-CM

## 2017-02-05 ENCOUNTER — Encounter: Payer: Self-pay | Admitting: Family Medicine

## 2017-02-05 ENCOUNTER — Ambulatory Visit (INDEPENDENT_AMBULATORY_CARE_PROVIDER_SITE_OTHER): Payer: BLUE CROSS/BLUE SHIELD | Admitting: Family Medicine

## 2017-02-05 VITALS — BP 120/88 | HR 55 | Temp 98.0°F | Wt 134.4 lb

## 2017-02-05 DIAGNOSIS — G2581 Restless legs syndrome: Secondary | ICD-10-CM | POA: Diagnosis not present

## 2017-02-05 DIAGNOSIS — L84 Corns and callosities: Secondary | ICD-10-CM | POA: Diagnosis not present

## 2017-02-05 MED ORDER — ROPINIROLE HCL 0.25 MG PO TABS: 0.2500 mg | ORAL_TABLET | Freq: Every day | ORAL | 1 refills | Status: DC

## 2017-02-05 NOTE — Patient Instructions (Signed)
Start the Requip one at night and may increase to two at night after 3 days. Try to gradually reduce caffeine intake.

## 2017-02-05 NOTE — Progress Notes (Signed)
Subjective:     Patient ID: Ann Lee, female   DOB: 1956/05/29, 61 y.o.   MRN: [id number]  HPI Patient seen for the following concerns  Right foot involving the ball of foot. She's had callus on the left foot in the past and has now calluses on the right foot. Pain with walking. She is requesting trimming which helped her left foot previously  She has restless feeling in her legs at night's predominantly left lower extremity. She denies any claudication type symptoms. No leg cramps. No leg swelling. She notices that when she gets up and ambulates at night her symptoms improved. Does drink a lot of caffeine with about 6 Pepsi's per day. No coffee or tea intake. No history of anemia. Recent hemoglobin over 15. Still smokes.  Past Medical History:  Diagnosis Date  . HERPES ZOSTER 10/18/2008  . NECK PAIN 02/15/2009  . PARESTHESIA 10/30/2009  . PERS HX TOBACCO USE PRESENTING HAZARDS HEALTH 03/10/2010  . SINUSITIS, ACUTE 05/03/2009  . SORE THROAT 02/12/2009  . TMJ SYNDROME 02/11/2009   No past surgical history on file.  reports that she has been smoking Cigarettes.  She has a 41.00 pack-year smoking history. She has never used smokeless tobacco. She reports that she does not drink alcohol or use drugs. family history includes Arthritis in her father and mother; Cancer in her father and mother; Hyperlipidemia in her father. Allergies  Allergen Reactions  . Iodine Hives and Swelling  . Shellfish Allergy Hives and Swelling     Review of Systems  Constitutional: Negative for fatigue.  Eyes: Negative for visual disturbance.  Respiratory: Negative for cough, chest tightness, shortness of breath and wheezing.   Cardiovascular: Negative for chest pain, palpitations and leg swelling.  Neurological: Negative for dizziness, seizures, syncope, weakness, light-headedness and headaches.       Objective:   Physical Exam  Constitutional: She appears well-developed and well-nourished.  Eyes: Pupils  are equal, round, and reactive to light.  Neck: Neck supple. No JVD present. No thyromegaly present.  Cardiovascular: Normal rate and regular rhythm.  Exam reveals no gallop.   Both feet are warm to touch with good dorsalis pedis pulses and fairly good capillary refill  Pulmonary/Chest: Effort normal and breath sounds normal. No respiratory distress. She has no wheezes. She has no rales.  Musculoskeletal: She exhibits no edema.  Neurological: She is alert.  Skin:  She has a couple of hardened calluses on her right foot one on the ball of foot in the midline and another just posterior to that       Assessment:     #1 calluses right foot  #2 probable restless leg syndrome    Plan:     -After discussing risk and benefits patient consented to trimming calluses right foot.  Used #15 blade and trimmed down to calluses in question with some improvement right away -Gradually reduce caffeine intake -Trial of Requip 0.25 mgs 1 daily at bedtime and after 3 nights may increase to 0.5 mg.   reassess in one month.  Eulas Post MD  [facility] at [facility]

## 2017-04-15 ENCOUNTER — Encounter: Payer: Self-pay | Admitting: Adult Health

## 2017-04-15 ENCOUNTER — Ambulatory Visit (INDEPENDENT_AMBULATORY_CARE_PROVIDER_SITE_OTHER): Payer: 59 | Admitting: Adult Health

## 2017-04-15 VITALS — BP 118/70 | Temp 98.0°F | Wt 133.0 lb

## 2017-04-15 DIAGNOSIS — L609 Nail disorder, unspecified: Secondary | ICD-10-CM

## 2017-04-15 NOTE — Progress Notes (Signed)
Subjective:    Patient ID: Ann Lee, female    DOB: February 12, 1956, 61 y.o.   MRN: [id number]  HPI  61 year old female who  has a past medical history of HERPES ZOSTER (10/18/2008); NECK PAIN (02/15/2009); PARESTHESIA (10/30/2009); PERS HX TOBACCO USE PRESENTING HAZARDS HEALTH (03/10/2010); SINUSITIS, ACUTE (05/03/2009); SORE THROAT (02/12/2009); and TMJ SYNDROME (02/11/2009).  She presents to the office today for the acute complaint of possible fungal infection to right middle finger nail. She first noticed about one week ago, reports that the size of the area on the nail has spread over the last week. She denies any trauma or pain to the the finger or fingernail   Review of Systems See HPI   Past Medical History:  Diagnosis Date  . HERPES ZOSTER 10/18/2008  . NECK PAIN 02/15/2009  . PARESTHESIA 10/30/2009  . PERS HX TOBACCO USE PRESENTING HAZARDS HEALTH 03/10/2010  . SINUSITIS, ACUTE 05/03/2009  . SORE THROAT 02/12/2009  . TMJ SYNDROME 02/11/2009    Social History   Social History  . Marital status: Divorced    Spouse name: N/A  . Number of children: N/A  . Years of education: N/A   Occupational History  . Not on file.   Social History Main Topics  . Smoking status: Current Every Day Smoker    Packs/day: 1.00    Years: 41.00    Types: Cigarettes  . Smokeless tobacco: Never Used     Comment: She and sister are going to work on quitting together  . Alcohol use No  . Drug use: No  . Sexual activity: Not on file   Other Topics Concern  . Not on file   Social History Narrative  . No narrative on file    No past surgical history on file.  Family History  Problem Relation Age of Onset  . Cancer Mother        esophageal  . Arthritis Mother   . Hyperlipidemia Father   . Arthritis Father   . Cancer Father        colon    Allergies  Allergen Reactions  . Iodine Hives and Swelling  . Shellfish Allergy Hives and Swelling    Current Outpatient Prescriptions on File  Prior to Visit  Medication Sig Dispense Refill  . SUMAtriptan (IMITREX) 100 MG tablet Take 1 tablet (100 mg total) by mouth every 2 (two) hours as needed for migraine. May repeat in 2 hours if headache persists or recurs. 9 tablet 2   No current facility-administered medications on file prior to visit.     BP 118/70 (BP Location: Left Arm)   Temp 98 F (36.7 C)   Wt 133 lb (60.3 kg)   BMI 24.33 kg/m       Objective:   Physical Exam  Constitutional: She is oriented to person, place, and time. She appears well-developed and well-nourished. No distress.  Neurological: She is alert and oriented to person, place, and time.  Skin: She is not diaphoretic.  Abnormality of right middle finger nail   Psychiatric: She has a normal mood and affect. Her behavior is normal. Judgment and thought content normal.  Nursing note and vitals reviewed.     Assessment & Plan:  1. Irregular finger nails - Does not appear as fungal infection. It appears as though the area in question is where the fingernail has displaced from the nail bed.  - Advised that there is nothing to do for this and the nail will  eventually grow out.   Dorothyann Peng, NP

## 2017-04-28 ENCOUNTER — Ambulatory Visit (INDEPENDENT_AMBULATORY_CARE_PROVIDER_SITE_OTHER): Payer: 59 | Admitting: Family Medicine

## 2017-04-28 ENCOUNTER — Encounter: Payer: Self-pay | Admitting: Family Medicine

## 2017-04-28 VITALS — BP 110/70 | HR 51 | Temp 98.1°F | Wt 133.9 lb

## 2017-04-28 DIAGNOSIS — J069 Acute upper respiratory infection, unspecified: Secondary | ICD-10-CM | POA: Diagnosis not present

## 2017-04-28 DIAGNOSIS — L603 Nail dystrophy: Secondary | ICD-10-CM | POA: Diagnosis not present

## 2017-04-28 DIAGNOSIS — G43909 Migraine, unspecified, not intractable, without status migrainosus: Secondary | ICD-10-CM

## 2017-04-28 DIAGNOSIS — B9789 Other viral agents as the cause of diseases classified elsewhere: Secondary | ICD-10-CM

## 2017-04-28 MED ORDER — SUMATRIPTAN SUCCINATE 100 MG PO TABS: 100.0000 mg | ORAL_TABLET | ORAL | 2 refills | Status: DC | PRN

## 2017-04-28 MED ORDER — TRIAMCINOLONE ACETONIDE 0.1 % EX CREA: 1.0000 "application " | TOPICAL_CREAM | Freq: Two times a day (BID) | CUTANEOUS | 2 refills | Status: DC

## 2017-04-28 NOTE — Patient Instructions (Signed)
?  onychomycosis vs onycholysis of right middle finger Get back in if you get artificial nail off- so we can culture.

## 2017-04-28 NOTE — Progress Notes (Signed)
Subjective:     Patient ID: Ann Lee, female   DOB: June 21, 1955, 61 y.o.   MRN: [id number]  HPI Patient seen regarding several items as follows  Right middle finger issues. She had presented here recently with some nail changes. Denies any injury. She has some artificial nails on currently but plans to get this off soon. She had noted some separation (from skin) of her natural nail underneath the artificial nail.  She's also recently noticed some redness around the base of the nail and dryness involving the fingertip.  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  She has history of migraine headaches and is requesting refills of Imitrex. Migraine headaches have been stable  She has some cough which is dry. She states she developed a "cold " a few days ago. No fever. No wheezing. No dyspnea. Cough nonproductive.  Past Medical History:  Diagnosis Date  . HERPES ZOSTER 10/18/2008  . NECK PAIN 02/15/2009  . PARESTHESIA 10/30/2009  . PERS HX TOBACCO USE PRESENTING HAZARDS HEALTH 03/10/2010  . SINUSITIS, ACUTE 05/03/2009  . SORE THROAT 02/12/2009  . TMJ SYNDROME 02/11/2009   No past surgical history on file.  reports that she has been smoking cigarettes.  She has a 41.00 pack-year smoking history. she has never used smokeless tobacco. She reports that she does not drink alcohol or use drugs. family history includes Arthritis in her father and mother; Cancer in her father and mother; Hyperlipidemia in her father. Allergies  Allergen Reactions  . Iodine Hives and Swelling  . Shellfish Allergy Hives and Swelling     Review of Systems  Constitutional: Negative for chills and fever.  HENT: Positive for congestion. Negative for sore throat.   Respiratory: Positive for cough.   Cardiovascular: Negative for chest pain.  Neurological: Negative for dizziness and headaches.       Objective:   Physical Exam  Constitutional: She appears  well-developed and well-nourished.  HENT:  Right Ear: External ear normal.  Left Ear: External ear normal.  Mouth/Throat: Oropharynx is clear and moist.  Neck: Neck supple.  Cardiovascular: Normal rate and regular rhythm.  Pulmonary/Chest: Effort normal and breath sounds normal. No respiratory distress. She has no wheezes. She has no rales.  Lymphadenopathy:    She has no cervical adenopathy.  Skin:  Right middle finger examined. She has artificial nails on all of her digits. We cannot adequately examine her nail because the artificial nail which is overlying her normal nail. She does have some very mild erythema near the base of the nail.   Dorsal skin has some dryness and mild cracking on the ventral surface.       Assessment:     #1 nail changes right middle finger. Onycholysis versus onychomycosis Also may have some chronic paronychia type changes right middle finger and general dryness  #2 migraine headaches  #3 probable viral URI with cough     Plan:     -Keep hands dry eyes possible -Triamcinolone 0.1% cream to use twice daily as needed for dryness and new the base of the nail -She will return after she gets artificial nail removed to reexamine finger and consider nail culture if indicated -Refill Imitrex for as needed use -Follow-up for any fever or other concerning symptoms  Eulas Post MD [facility] at [facility]

## 2017-05-05 ENCOUNTER — Encounter: Payer: Self-pay | Admitting: Family Medicine

## 2017-05-05 ENCOUNTER — Ambulatory Visit (INDEPENDENT_AMBULATORY_CARE_PROVIDER_SITE_OTHER): Payer: 59 | Admitting: Family Medicine

## 2017-05-05 VITALS — BP 110/70 | HR 70 | Temp 98.1°F

## 2017-05-05 DIAGNOSIS — Z79899 Other long term (current) drug therapy: Secondary | ICD-10-CM | POA: Diagnosis not present

## 2017-05-05 DIAGNOSIS — B351 Tinea unguium: Secondary | ICD-10-CM

## 2017-05-05 DIAGNOSIS — L03019 Cellulitis of unspecified finger: Secondary | ICD-10-CM

## 2017-05-05 LAB — HEPATIC FUNCTION PANEL
ALK PHOS: 77 U/L (ref 39–117)
ALT: 9 U/L (ref 0–35)
AST: 13 U/L (ref 0–37)
Albumin: 3.8 g/dL (ref 3.5–5.2)
Bilirubin, Direct: 0.1 mg/dL (ref 0.0–0.3)
TOTAL PROTEIN: 5.9 g/dL — AB (ref 6.0–8.3)
Total Bilirubin: 0.5 mg/dL (ref 0.2–1.2)

## 2017-05-05 MED ORDER — TERBINAFINE HCL 250 MG PO TABS: 250.0000 mg | ORAL_TABLET | Freq: Every day | ORAL | 0 refills | Status: DC

## 2017-05-05 NOTE — Progress Notes (Signed)
Subjective:     Patient ID: Ann Lee, female   DOB: May 05, 1956, 61 y.o.   MRN: [id number]  HPI Recent skin and nail changes of hands. 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 We felt she had some evidence of chronic paronychia and started steroid cream. She is trying to keep her hands out of water.  She has some thickening especially of the right middle finger nail. She had artificial nails on last visit which are now removed- allowing better visibility of nails.  Past Medical History:  Diagnosis Date  . HERPES ZOSTER 10/18/2008  . NECK PAIN 02/15/2009  . PARESTHESIA 10/30/2009  . PERS HX TOBACCO USE PRESENTING HAZARDS HEALTH 03/10/2010  . SINUSITIS, ACUTE 05/03/2009  . SORE THROAT 02/12/2009  . TMJ SYNDROME 02/11/2009   No past surgical history on file.  reports that she has been smoking cigarettes.  She has a 41.00 pack-year smoking history. she has never used smokeless tobacco. She reports that she does not drink alcohol or use drugs. family history includes Arthritis in her father and mother; Cancer in her father and mother; Hyperlipidemia in her father. Allergies  Allergen Reactions  . Iodine Hives and Swelling  . Shellfish Allergy Hives and Swelling       Review of Systems  Constitutional: Negative for chills and fever.       Objective:   Physical Exam  Constitutional: She appears well-developed and well-nourished.  Cardiovascular: Normal rate and regular rhythm.  Skin:  She has general dryness of both hands and actually the skin looks somewhat better on her fingers but she has some thickening of the nail (right middle finger) which was not evidenced last time because of her artificial nails. She has some brittle changes underneath the right middle fingernail.       Assessment:     #1 probable chronic paronychia involving several digits  #2 probable onychomycosis right middle finger    Plan:     -Continue to keep hands dry -Triamcinolone  cream once or twice daily -Check hepatic panel and if normal start Lamisil 250 mg once daily for 6 weeks  Eulas Post MD [facility]

## 2017-05-11 ENCOUNTER — Telehealth: Payer: Self-pay | Admitting: Family Medicine

## 2017-05-11 NOTE — Telephone Encounter (Signed)
Lab results given with verbal understanding.

## 2017-11-24 ENCOUNTER — Inpatient Hospital Stay: Admission: RE | Admit: 2017-11-24 | Payer: 59 | Source: Ambulatory Visit

## 2017-12-08 ENCOUNTER — Ambulatory Visit (INDEPENDENT_AMBULATORY_CARE_PROVIDER_SITE_OTHER)
Admission: RE | Admit: 2017-12-08 | Discharge: 2017-12-08 | Disposition: A | Discharge status: Home / Self Care | Payer: 59 | Source: Ambulatory Visit | Attending: Acute Care | Admitting: Acute Care

## 2017-12-08 ENCOUNTER — Encounter (INDEPENDENT_AMBULATORY_CARE_PROVIDER_SITE_OTHER): Payer: Self-pay

## 2017-12-08 DIAGNOSIS — F17210 Nicotine dependence, cigarettes, uncomplicated: Secondary | ICD-10-CM

## 2017-12-13 ENCOUNTER — Other Ambulatory Visit: Payer: Self-pay | Admitting: Acute Care

## 2017-12-13 DIAGNOSIS — Z122 Encounter for screening for malignant neoplasm of respiratory organs: Secondary | ICD-10-CM

## 2017-12-13 DIAGNOSIS — F17210 Nicotine dependence, cigarettes, uncomplicated: Secondary | ICD-10-CM

## 2018-06-22 ENCOUNTER — Ambulatory Visit: Payer: 59 | Admitting: Family Medicine

## 2018-06-22 ENCOUNTER — Other Ambulatory Visit: Payer: Self-pay

## 2018-06-22 ENCOUNTER — Encounter: Payer: Self-pay | Admitting: Family Medicine

## 2018-06-22 ENCOUNTER — Encounter

## 2018-06-22 VITALS — BP 108/70 | HR 92 | Temp 97.8°F | Wt 134.2 lb

## 2018-06-22 DIAGNOSIS — J069 Acute upper respiratory infection, unspecified: Secondary | ICD-10-CM

## 2018-06-22 DIAGNOSIS — I7300 Raynaud's syndrome without gangrene: Secondary | ICD-10-CM | POA: Diagnosis not present

## 2018-06-22 DIAGNOSIS — G2581 Restless legs syndrome: Secondary | ICD-10-CM | POA: Diagnosis not present

## 2018-06-22 LAB — CBC WITH DIFFERENTIAL/PLATELET
BASOS PCT: 0.8 % (ref 0.0–3.0)
Basophils Absolute: 0.1 10*3/uL (ref 0.0–0.1)
EOS PCT: 3.3 % (ref 0.0–5.0)
Eosinophils Absolute: 0.2 10*3/uL (ref 0.0–0.7)
HCT: 44.1 % (ref 36.0–46.0)
Hemoglobin: 14.9 g/dL (ref 12.0–15.0)
LYMPHS ABS: 1.8 10*3/uL (ref 0.7–4.0)
Lymphocytes Relative: 26 % (ref 12.0–46.0)
MCHC: 33.9 g/dL (ref 30.0–36.0)
MCV: 88.9 fl (ref 78.0–100.0)
MONO ABS: 0.7 10*3/uL (ref 0.1–1.0)
MONOS PCT: 9.7 % (ref 3.0–12.0)
NEUTROS PCT: 60.2 % (ref 43.0–77.0)
Neutro Abs: 4.1 10*3/uL (ref 1.4–7.7)
Platelets: 216 10*3/uL (ref 150.0–400.0)
RBC: 4.96 Mil/uL (ref 3.87–5.11)
RDW: 13.5 % (ref 11.5–15.5)
WBC: 6.7 10*3/uL (ref 4.0–10.5)

## 2018-06-22 MED ORDER — SUMATRIPTAN SUCCINATE 100 MG PO TABS: 100.0000 mg | ORAL_TABLET | ORAL | 2 refills | Status: DC | PRN

## 2018-06-22 MED ORDER — PRAMIPEXOLE DIHYDROCHLORIDE 0.125 MG PO TABS: ORAL_TABLET | ORAL | 5 refills | Status: DC

## 2018-06-22 NOTE — Patient Instructions (Signed)

## 2018-06-22 NOTE — Progress Notes (Signed)
  Subjective:     Patient ID: Ann Lee, female   DOB: 02/12/1956, 63 y.o.   MRN: [id number]  HPI  Patient seen for the following symptoms  Acute issue of URI symptoms which started about a week ago.  She has had some sinus congestion and occasional facial pain but no bloody discharge.  No purulent secretions.  No fever.  Occasional mild headache.  Symptoms seem to be slightly improved today.  Occasional mild vertigo symptoms  She is having some recent restless leg symptoms.  Her legs frequently feel jumpy at night and usually improved with movement such as ambulation.  She does drink a lot of caffeine and also smokes.  Past history of anemia.  She describes Raynaud's type phenomenon right ring finger recently when she is out in the cold.  This lasted about 30 minutes to an hour.  She had no symptoms since then.  Past Medical History:  Diagnosis Date  . HERPES ZOSTER 10/18/2008  . NECK PAIN 02/15/2009  . PARESTHESIA 10/30/2009  . PERS HX TOBACCO USE PRESENTING HAZARDS HEALTH 03/10/2010  . SINUSITIS, ACUTE 05/03/2009  . SORE THROAT 02/12/2009  . TMJ SYNDROME 02/11/2009   History reviewed. No pertinent surgical history.  reports that she has been smoking cigarettes. She has a 41.00 pack-year smoking history. She has never used smokeless tobacco. She reports that she does not drink alcohol or use drugs. family history includes Arthritis in her father and mother; Cancer in her father and mother; Hyperlipidemia in her father. Allergies  Allergen Reactions  . Iodine Hives and Swelling  . Shellfish Allergy Hives and Swelling    Review of Systems  Constitutional: Positive for fatigue. Negative for chills and fever.  HENT: Positive for congestion.   Eyes: Negative for visual disturbance.  Respiratory: Positive for cough. Negative for chest tightness, shortness of breath and wheezing.   Cardiovascular: Negative for chest pain, palpitations and leg swelling.  Neurological: Positive for  dizziness. Negative for seizures, syncope, weakness, light-headedness and headaches.       Objective:   Physical Exam Constitutional:      Appearance: She is well-developed.  Eyes:     Pupils: Pupils are equal, round, and reactive to light.  Neck:     Musculoskeletal: Neck supple.     Thyroid: No thyromegaly.     Vascular: No JVD.  Cardiovascular:     Rate and Rhythm: Normal rate and regular rhythm.     Heart sounds: No gallop.   Pulmonary:     Effort: Pulmonary effort is normal. No respiratory distress.     Breath sounds: Normal breath sounds. No wheezing or rales.  Musculoskeletal:     Comments: No lower extremity edema.  Feet are warm to touch.  She has palpable posterior tibial and dorsalis pedis pulses bilaterally.  Good capillary refill  Neurological:     Mental Status: She is alert.        Assessment:     #1 viral URI.  Nonfocal exam.  May have some mild concomitant vertigo  #2 transient Raynaud's phenomenon right ring finger  #3 restless leg symptoms    Plan:     -She is encouraged to quit smoking -Gradually scale back caffeine use -Check CBC to rule out anemia -Get a complete physical -Mirapex 0.125 mg 1-2 every night as needed for restless leg symptoms  Eulas Post MD [facility] at [facility]

## 2018-07-11 ENCOUNTER — Encounter: Payer: 59 | Admitting: Family Medicine

## 2018-07-11 DIAGNOSIS — Z0289 Encounter for other administrative examinations: Secondary | ICD-10-CM

## 2018-09-12 ENCOUNTER — Encounter: Payer: Self-pay | Admitting: Family Medicine

## 2018-09-12 ENCOUNTER — Ambulatory Visit: Payer: 59 | Admitting: Family Medicine

## 2018-09-12 ENCOUNTER — Other Ambulatory Visit: Payer: Self-pay

## 2018-09-12 VITALS — BP 106/64 | HR 79 | Temp 97.7°F | Ht 62.5 in | Wt 135.7 lb

## 2018-09-12 DIAGNOSIS — R2242 Localized swelling, mass and lump, left lower limb: Secondary | ICD-10-CM | POA: Diagnosis not present

## 2018-09-12 NOTE — Patient Instructions (Signed)
Follow up for any rapid growth, pain, or left inguinal lymph node swelling.

## 2018-09-12 NOTE — Progress Notes (Signed)
  Subjective:     Patient ID: Ann Lee, female   DOB: 10/12/1955, 63 y.o.   MRN: [id number]  HPI Patient is seen with concern for possible small lump left anterior thigh.  She incidentally noticed this last night when she was cleaning up.  She has not noted any left inguinal lymphadenopathy.  No skin rashes.  No other masses noted.  Past Medical History:  Diagnosis Date  . HERPES ZOSTER 10/18/2008  . NECK PAIN 02/15/2009  . PARESTHESIA 10/30/2009  . PERS HX TOBACCO USE PRESENTING HAZARDS HEALTH 03/10/2010  . SINUSITIS, ACUTE 05/03/2009  . SORE THROAT 02/12/2009  . TMJ SYNDROME 02/11/2009   History reviewed. No pertinent surgical history.  reports that she has been smoking cigarettes. She has a 41.00 pack-year smoking history. She has never used smokeless tobacco. She reports that she does not drink alcohol or use drugs. family history includes Arthritis in her father and mother; Cancer in her father and mother; Hyperlipidemia in her father. Allergies  Allergen Reactions  . Iodine Hives and Swelling  . Shellfish Allergy Hives and Swelling     Review of Systems  Constitutional: Negative for appetite change, chills, fever and unexpected weight change.  Respiratory: Negative for cough and shortness of breath.   Hematological: Negative for adenopathy.       Objective:   Physical Exam Constitutional:      Appearance: Normal appearance.  Cardiovascular:     Rate and Rhythm: Normal rate and regular rhythm.  Skin:    Comments: Left upper anterior thigh.  Approximately 4 x 4 mm mobile nontender very small mass which is subcutaneous.  No overlying skin changes.  Neurological:     Mental Status: She is alert.        Assessment:     Very small mass left anterior thigh.  This is mobile and nontender.  Suspect benign cyst vs very small lipoma.    Plan:     Given current COVID-19 crisis.  We have not recommended any further imaging or evaluation at this time.  We have instructed  her to follow-up for any pain, rapid growth, or any left inguinal adenopathy.  Eulas Post MD [facility] at [facility]

## 2018-09-28 ENCOUNTER — Other Ambulatory Visit: Payer: Self-pay

## 2018-09-28 ENCOUNTER — Ambulatory Visit (INDEPENDENT_AMBULATORY_CARE_PROVIDER_SITE_OTHER): Payer: 59 | Admitting: Family Medicine

## 2018-09-28 DIAGNOSIS — M542 Cervicalgia: Secondary | ICD-10-CM | POA: Diagnosis not present

## 2018-09-28 MED ORDER — CYCLOBENZAPRINE HCL 5 MG PO TABS: ORAL_TABLET | ORAL | 1 refills | Status: DC

## 2018-09-28 NOTE — Progress Notes (Signed)
Patient ID: Ann Lee, female   DOB: November 16, 1955, 63 y.o.   MRN: [id number]  Virtual Visit via Video Note  I connected with Ortencia Kick on 09/28/18 at  3:00 PM EDT by a video enabled telemedicine application and verified that I am speaking with the correct person using two identifiers.  Location patient: home Location provider:work or home office Persons participating in the virtual visit: patient, provider  I discussed the limitations of evaluation and management by telemedicine and the availability of in person appointments. The patient expressed understanding and agreed to proceed.   HPI: Patient complains of about 5-day history of some spasm and tightness in the muscles right side of neck.  Denies any recent injury.  No change of pillows recently.  She has pain which is increased with movement.  No radiculitis symptoms.  No upper extremity weakness.  No numbness.  She is tried multiple things including back massage, heat, ice, topical Aspercreme, and Advil without much relief.  She feels she is having some muscle spasm mostly upper back and right side of neck.  She is able to flex and extend her neck without difficulty.  No fever.  No cough.  No dyspnea.  No chest pain.   ROS: See pertinent positives and negatives per HPI.  Past Medical History:  Diagnosis Date  . HERPES ZOSTER 10/18/2008  . NECK PAIN 02/15/2009  . PARESTHESIA 10/30/2009  . PERS HX TOBACCO USE PRESENTING HAZARDS HEALTH 03/10/2010  . SINUSITIS, ACUTE 05/03/2009  . SORE THROAT 02/12/2009  . TMJ SYNDROME 02/11/2009    No past surgical history on file.  Family History  Problem Relation Age of Onset  . Cancer Mother        esophageal  . Arthritis Mother   . Hyperlipidemia Father   . Arthritis Father   . Cancer Father        colon    SOCIAL HX: Ongoing nicotine use   Current Outpatient Medications:  .  pramipexole (MIRAPEX) 0.125 MG tablet, Take one to two at night as needed for restless leg symptoms.,  Disp: 60 tablet, Rfl: 5 .  SUMAtriptan (IMITREX) 100 MG tablet, Take 1 tablet (100 mg total) by mouth every 2 (two) hours as needed for migraine. May repeat in 2 hours if headache persists or recurs., Disp: 9 tablet, Rfl: 2 .  terbinafine (LAMISIL) 250 MG tablet, Take 1 tablet (250 mg total) by mouth daily., Disp: 45 tablet, Rfl: 0 .  triamcinolone cream (KENALOG) 0.1 %, Apply 1 application 2 (two) times daily topically., Disp: 30 g, Rfl: 2  EXAM:  VITALS per patient if applicable:  GENERAL: alert, oriented, appears well and in no acute distress  HEENT: atraumatic, conjunttiva clear, no obvious abnormalities on inspection of external nose and ears  NECK: normal movements of the head and neck  LUNGS: on inspection no signs of respiratory distress, breathing rate appears normal, no obvious gross SOB, gasping or wheezing  CV: no obvious cyanosis  MS: moves all visible extremities without noticeable abnormality  PSYCH/NEURO: pleasant and cooperative, no obvious depression or anxiety, speech and thought processing grossly intact  ASSESSMENT AND PLAN:  Discussed the following assessment and plan:  Right-sided neck pain.  Sounds more muscular.  She denies any radiculitis symptoms or neurologic symptoms such as numbness or weakness  -Continue heat and muscle massage -Recommend trial of Flexeril 5 mg 1-2 every 8 hours as needed for muscle spasm.  She is cautioned about potential side effects such as sedation and dry  mouth -Touch base by next week if not improving     I discussed the assessment and treatment plan with the patient. The patient was provided an opportunity to ask questions and all were answered. The patient agreed with the plan and demonstrated an understanding of the instructions.   The patient was advised to call back or seek an in-person evaluation if the symptoms worsen or if the condition fails to improve as anticipated.  Carolann Littler, MD

## 2018-12-23 ENCOUNTER — Other Ambulatory Visit: Payer: Self-pay | Admitting: *Deleted

## 2018-12-23 DIAGNOSIS — Z122 Encounter for screening for malignant neoplasm of respiratory organs: Secondary | ICD-10-CM

## 2018-12-23 DIAGNOSIS — F17210 Nicotine dependence, cigarettes, uncomplicated: Secondary | ICD-10-CM

## 2019-02-14 ENCOUNTER — Encounter (INDEPENDENT_AMBULATORY_CARE_PROVIDER_SITE_OTHER): Payer: Self-pay

## 2019-02-14 ENCOUNTER — Ambulatory Visit (INDEPENDENT_AMBULATORY_CARE_PROVIDER_SITE_OTHER)
Admission: RE | Admit: 2019-02-14 | Discharge: 2019-02-14 | Disposition: A | Discharge status: Home / Self Care | Payer: 59 | Source: Ambulatory Visit | Attending: Acute Care | Admitting: Acute Care

## 2019-02-14 ENCOUNTER — Other Ambulatory Visit: Payer: Self-pay

## 2019-02-14 DIAGNOSIS — Z122 Encounter for screening for malignant neoplasm of respiratory organs: Secondary | ICD-10-CM

## 2019-02-14 DIAGNOSIS — F17210 Nicotine dependence, cigarettes, uncomplicated: Secondary | ICD-10-CM

## 2019-02-16 ENCOUNTER — Telehealth: Payer: Self-pay | Admitting: Acute Care

## 2019-02-16 DIAGNOSIS — Z122 Encounter for screening for malignant neoplasm of respiratory organs: Secondary | ICD-10-CM

## 2019-02-16 DIAGNOSIS — F17210 Nicotine dependence, cigarettes, uncomplicated: Secondary | ICD-10-CM

## 2019-02-17 NOTE — Telephone Encounter (Signed)
Pt informed of CT results per Sarah Groce, NP.  PT verbalized understanding.  Copy sent to PCP.  Order placed for 1 yr f/u CT.  

## 2019-03-03 ENCOUNTER — Other Ambulatory Visit: Payer: Self-pay | Admitting: Family Medicine

## 2019-06-29 ENCOUNTER — Ambulatory Visit: Payer: 59 | Attending: Internal Medicine

## 2019-06-29 DIAGNOSIS — Z23 Encounter for immunization: Secondary | ICD-10-CM

## 2019-06-29 NOTE — Progress Notes (Signed)
   Covid-19 Vaccination Clinic  Name:  Ann Lee    MRN: [id number] DOB: August 02, 1955  06/29/2019  [name] was observed post Covid-19 immunization for 15 minutes without incidence. She was provided with Vaccine Information Sheet and instruction to access the V-Safe system.   [name] was instructed to call 911 with any severe reactions post vaccine: Marland Kitchen Difficulty breathing  . Swelling of your face and throat  . A fast heartbeat  . A bad rash all over your body  . Dizziness and weakness    Immunizations Administered    Name Date Dose VIS Date Route   Pfizer COVID-19 Vaccine 06/29/2019  9:30 AM 0.3 mL 05/26/2019 Intramuscular   Manufacturer: [place]   Lot: [id number]   [place]: [id number]

## 2019-07-17 ENCOUNTER — Ambulatory Visit: Payer: 59 | Attending: Internal Medicine

## 2019-07-17 DIAGNOSIS — Z23 Encounter for immunization: Secondary | ICD-10-CM | POA: Insufficient documentation

## 2019-07-17 NOTE — Progress Notes (Signed)
   Covid-19 Vaccination Clinic  Name:  Ann Lee    MRN: [id number] DOB: 1956/03/28  07/17/2019  [name] was observed post Covid-19 immunization for 15 minutes without incidence. She was provided with Vaccine Information Sheet and instruction to access the V-Safe system.   [name] was instructed to call 911 with any severe reactions post vaccine: Marland Kitchen Difficulty breathing  . Swelling of your face and throat  . A fast heartbeat  . A bad rash all over your body  . Dizziness and weakness    Immunizations Administered    Name Date Dose VIS Date Route   Pfizer COVID-19 Vaccine 07/17/2019  8:19 AM 0.3 mL 05/26/2019 Intramuscular   Manufacturer: [place]   Lot: [id number]   [place]: [id number]

## 2019-10-04 ENCOUNTER — Telehealth (INDEPENDENT_AMBULATORY_CARE_PROVIDER_SITE_OTHER): Payer: 59 | Admitting: Family Medicine

## 2019-10-04 ENCOUNTER — Other Ambulatory Visit: Payer: Self-pay

## 2019-10-04 DIAGNOSIS — J069 Acute upper respiratory infection, unspecified: Secondary | ICD-10-CM | POA: Diagnosis not present

## 2019-10-04 MED ORDER — SUMATRIPTAN SUCCINATE 100 MG PO TABS: ORAL_TABLET | ORAL | 2 refills | Status: DC

## 2019-10-04 NOTE — Progress Notes (Signed)
Patient ID: Ann Lee, female   DOB: [birth year], 64 y.o.   MRN: [id number]  This visit type was conducted due to national recommendations for restrictions regarding the COVID-19 pandemic in an effort to limit this patient's exposure and mitigate transmission in our community.   Virtual Visit via Video Note  I connected with Lorina Rabon on 10/04/19 at  5:15 PM EDT by a video enabled telemedicine application and verified that I am speaking with the correct person using two identifiers.  Location patient: home Location provider:work or home office Persons participating in the virtual visit: patient, provider  I discussed the limitations of evaluation and management by telemedicine and the availability of in person appointments. The patient expressed understanding and agreed to proceed.   HPI: [name] called with onset yesterday of mild earaches, mild body aches, sore throat, increased nasal congestion and some facial pain.  She had fever up to 101.  She relates having Covid vaccine back in January and February.  No purulent secretions.  No cough.  No loss of taste or smell.  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  She has not noted any exudate in her posterior pharynx  Also requesting refills of Imitrex for migraine headaches which are infrequent   ROS: See pertinent positives and negatives per HPI.  Past Medical History:  Diagnosis Date  . HERPES ZOSTER 10/18/2008  . NECK PAIN 02/15/2009  . PARESTHESIA 10/30/2009  . PERS HX TOBACCO USE PRESENTING HAZARDS HEALTH 03/10/2010  . SINUSITIS, ACUTE 05/03/2009  . SORE THROAT 02/12/2009  . TMJ SYNDROME 02/11/2009    No past surgical history on file.  Family History  Problem Relation Age of Onset  . Cancer Mother        esophageal  . Arthritis Mother   . Hyperlipidemia Father   . Arthritis Father   . Cancer Father        colon    SOCIAL HX: Long history of smoking   Current  Outpatient Medications:  .  cyclobenzaprine (FLEXERIL) 5 MG tablet, Take 1 to 2 tablets every 8 hours as needed for muscle spasm, Disp: 30 tablet, Rfl: 1 .  pramipexole (MIRAPEX) 0.125 MG tablet, Take one to two at night as needed for restless leg symptoms., Disp: 60 tablet, Rfl: 5 .  SUMAtriptan (IMITREX) 100 MG tablet, 1 TABLET BY MOUTH EVERY 2 AS NEEDED FOR MIGRAINE.REPEAT IN 2 HOURS IF HEADACHE PERSISTS OR RECURS., Disp: 9 tablet, Rfl: 2 .  terbinafine (LAMISIL) 250 MG tablet, Take 1 tablet (250 mg total) by mouth daily., Disp: 45 tablet, Rfl: 0 .  triamcinolone cream (KENALOG) 0.1 %, Apply 1 application 2 (two) times daily topically., Disp: 30 g, Rfl: 2  EXAM:  VITALS per patient if applicable:  GENERAL: alert, oriented, appears well and in no acute distress  HEENT: atraumatic, conjunttiva clear, no obvious abnormalities on inspection of external nose and ears  NECK: normal movements of the head and neck  LUNGS: on inspection no signs of respiratory distress, breathing rate appears normal, no obvious gross SOB, gasping or wheezing  CV: no obvious cyanosis  MS: moves all visible extremities without noticeable abnormality  PSYCH/NEURO: pleasant and cooperative, no obvious depression or anxiety, speech and thought processing grossly intact  ASSESSMENT AND PLAN:  Discussed the following assessment and plan:  Probable viral URI.  Patient has had previous Covid vaccines.  Frequent exposure to many children.  Would still consider Covid testing for any persistent  or progressive symptoms.    -Recommend symptomatic treatment with plenty fluids and rest and over-the-counter analgesics as needed. -Follow-up promptly for any persistent or worsening symptoms     I discussed the assessment and treatment plan with the patient. The patient was provided an opportunity to ask questions and all were answered. The patient agreed with the plan and demonstrated an understanding of the  instructions.   The patient was advised to call back or seek an in-person evaluation if the symptoms worsen or if the condition fails to improve as anticipated.     Carolann Littler, MD

## 2019-11-28 ENCOUNTER — Encounter: Payer: Self-pay | Admitting: Family Medicine

## 2019-11-28 ENCOUNTER — Other Ambulatory Visit: Payer: Self-pay

## 2019-11-28 ENCOUNTER — Ambulatory Visit (INDEPENDENT_AMBULATORY_CARE_PROVIDER_SITE_OTHER): Payer: 59 | Admitting: Family Medicine

## 2019-11-28 VITALS — BP 118/64 | HR 75 | Temp 98.3°F | Wt 132.0 lb

## 2019-11-28 DIAGNOSIS — S76011A Strain of muscle, fascia and tendon of right hip, initial encounter: Secondary | ICD-10-CM

## 2019-11-28 MED ORDER — MELOXICAM 15 MG PO TABS: 15.0000 mg | ORAL_TABLET | Freq: Every day | ORAL | 0 refills | Status: DC

## 2019-11-28 NOTE — Patient Instructions (Signed)
Gluteal Strain  A gluteal strain happens when the muscles in the buttocks (gluteal muscles) are overstretched or torn. A tear can be partial or complete. A gluteal strain can cause pain and stiffness in your buttocks, legs, and lower back. A strain might also be called "pulling a muscle." The severity of a gluteal strain is rated as Grade 1, 2, or 3. A Grade 3 strain has the most tearing and pain. What are the causes? This condition may be caused by:  Stretching the muscles too far.  Putting too much stress on the muscles before they are warmed up.  Overusing the muscles.  Repetitive muscle movements over long periods of time.  Injury. What increases the risk? The following factors may make you more likely to develop this condition:  Being out in cold weather.  Being physically tired.  Having poor strength and flexibility.  Not warming up properly before physical activity.  Exercising or playing sports with sudden bursts of activity.  Having poor exercise techniques. What are the signs or symptoms? Symptoms of this condition include:  Pain in the buttocks, especially when moving the legs. Pain may spread to the lower back or to the legs.  Bruising and swelling of the buttocks.  Tenderness, weakness, or stiffness in the buttocks.  Muscle spasms. How is this diagnosed? This condition is diagnosed based on a physical exam and medical history.  Your health care provider may do some range of motion exercises with you.  You may have tests, such as MRI, ultrasound, or X-rays. Your strain may be rated based on how severe it is. The ratings are:  Grade 1 strain (mild). Your muscles are overstretched. You might have very small muscle tears. This type of strain generally heals in about one week.  Grade 2 strain (moderate). Your muscles are partially torn. This may take one to two months to heal.  Grade 3 strain (severe). Your muscles are completely torn. A severe strain can  take more than three months to heal. Grade 3 gluteal strains are rare. How is this treated? This condition may be treated with:  Resting, icing, and raising (elevating) the injured area as much as possible.  Over-the-counter pain medicines. If your gluteal strain is severe or very painful, your health care provider may prescribe pain medicines or physical therapy. Surgery for severe strains is rare. Follow these instructions at home: Managing pain, stiffness, and swelling   If directed, put ice on the injured area: ? Put ice in a plastic bag. ? Place a towel between your skin and the bag. ? Leave the ice on for 20 minutes, 2-3 times per day. Activity  Do not drive or use heavy machinery while taking prescription pain medicine.  Return to your normal activities as told by your health care provider. Ask your health care provider what activities are safe for you. ? Rest your gluteal muscles as much as possible, especially for the first 2-3 days. ? Begin exercising or stretching as told by your health care provider. General instructions  Take over-the-counter and prescription medicines only as told by your health care provider.  Follow your treatment plan as told by your health care provider. This may include: ? Physical therapy. ? Massage. ? Local electrical stimulation (transcutaneous electrical nerve stimulation, TENS).  Keep all follow-up visits as told by your health care provider. This is important. How is this prevented?  Warm up and stretch before physical activity.  Stretch after physical activity.  Learn and use correct  techniques for exercising and playing sports.  Avoid difficult physical activities if your muscles are tired or sore.  Strengthen your gluteal muscles and the surrounding muscles.  Develop your flexibility by stretching at least once a day. Contact a health care provider if you have:  Pain or swelling that gets worse or does not get better with  medicine.  A sudden, sharp pain that moves through your leg. Get help right away if you:  Develop weakness in part of your body.  Lose feeling in part of your body.  Have severe pain.  Are unable to walk.  Have difficulty controlling your bladder or bowel movements. Summary  A gluteal strain happens when the muscles in the buttocks (gluteal muscles) are overstretched or torn.  A gluteal strain can cause pain and stiffness in your buttocks, legs, and lower back.  If your gluteal strain is severe or very painful, your health care provider may prescribe pain medicines or physical therapy. This information is not intended to replace advice given to you by your health care provider. Make sure you discuss any questions you have with your health care provider. Document Revised: 09/22/2018 Document Reviewed: 10/25/2017 Elsevier Patient Education  [place].

## 2019-11-28 NOTE — Progress Notes (Signed)
  Subjective:     Patient ID: Ann Lee, female   DOB: 18-Apr-1956, 64 y.o.   MRN: [id number]  HPI [name] is seen with right gluteus injury which occurred Friday, May 21.  She was playing with her 70-year-old granddaughter and went to do a cartwheel without warming up and felt a pulling sensation in her gluteal region on the right side.  She has not seen any bruising or swelling.  She states she is gradually getting better but still having pain when she tries to drive.  She is ambulating without difficulty.  She denies any low back pain.  No numbness or weakness.  She has tried occasional ibuprofen with some relief.  She is also alternating heat and ice with some relief.  Past Medical History:  Diagnosis Date  . HERPES ZOSTER 10/18/2008  . NECK PAIN 02/15/2009  . PARESTHESIA 10/30/2009  . PERS HX TOBACCO USE PRESENTING HAZARDS HEALTH 03/10/2010  . SINUSITIS, ACUTE 05/03/2009  . SORE THROAT 02/12/2009  . TMJ SYNDROME 02/11/2009   No past surgical history on file.  reports that she has been smoking cigarettes. She has a 41.00 pack-year smoking history. She has never used smokeless tobacco. She reports that she does not drink alcohol and does not use drugs. family history includes Arthritis in her father and mother; Cancer in her father and mother; Hyperlipidemia in her father. Allergies  Allergen Reactions  . Iodine Hives and Swelling  . Shellfish Allergy Hives and Swelling     Review of Systems  Musculoskeletal: Negative for back pain.  Neurological: Negative for weakness and numbness.       Objective:   Physical Exam Vitals reviewed.  Constitutional:      Appearance: Normal appearance.  Cardiovascular:     Rate and Rhythm: Normal rate and regular rhythm.  Musculoskeletal:     Right lower leg: No edema.     Left lower leg: No edema.     Comments: Good range of motion right hip.  No lower lumbar tenderness.  She has some pain in the gluteus region with knee flexion against  resistance.  Neurological:     Mental Status: She is alert.        Assessment:     Right gluteus muscle strain    Plan:     -Recommend trial of meloxicam 15 mg once daily -Gentle stretches -Touch base if not continuing to improve over the next 2 to 3 weeks  Eulas Post MD [facility] at [facility]

## 2019-12-07 ENCOUNTER — Other Ambulatory Visit: Payer: Self-pay

## 2019-12-08 ENCOUNTER — Encounter: Payer: Self-pay | Admitting: Family Medicine

## 2019-12-08 ENCOUNTER — Ambulatory Visit (INDEPENDENT_AMBULATORY_CARE_PROVIDER_SITE_OTHER): Payer: 59 | Admitting: Family Medicine

## 2019-12-08 VITALS — BP 112/82 | HR 64 | Temp 97.5°F | Resp 16 | Ht 62.5 in | Wt 131.0 lb

## 2019-12-08 DIAGNOSIS — M79651 Pain in right thigh: Secondary | ICD-10-CM | POA: Diagnosis not present

## 2019-12-08 DIAGNOSIS — S76011A Strain of muscle, fascia and tendon of right hip, initial encounter: Secondary | ICD-10-CM

## 2019-12-08 MED ORDER — CELECOXIB 100 MG PO CAPS: 100.0000 mg | ORAL_CAPSULE | Freq: Two times a day (BID) | ORAL | 0 refills | Status: AC

## 2019-12-08 NOTE — Progress Notes (Signed)
ACUTE VISIT  Chief Complaint  Patient presents with  . Leg Pain    stabbing pain in top of right thigh that started 3 or 4 days ago, not going away   HPI: Ann Lee is a 64 y.o. female with hx of tobacco use, back pain,and headaches here today with above complaint.  Stabbing pain proximal aspect of anterior thigh. Pain is intermittent, no radiated. Pain 8/10, worse at the end of the day. She has no pain during the night when in bed or when she gets up, starts after being up for a couple hours. Pain is not getting any better,stable.  Negative for leg numbness,tingling,burning,saddle anesthesia,or bowel/bladder dysfunction.  On 11/28/19 seen because right gluteus muscle strain, injured on 11/03/19. She has not had other injuries. She is till having pain, she is avoiding applying pressure on right side,sits certain way to avoid pain.She thinks she may have put some stress on right leg muscle trying to protect gluteal area.  Negative for fever,chills,abdominal pain,N/V,RLE edema or erythema.  Review of Systems  Constitutional: Positive for activity change. Negative for appetite change and unexpected weight change.  Respiratory: Negative for cough, shortness of breath and wheezing.   Cardiovascular: Negative for chest pain, palpitations and leg swelling.  Gastrointestinal: Negative for nausea and vomiting.       No changes in bowel movements.  Genitourinary: Negative for decreased urine volume, dysuria and hematuria.  Skin: Negative for pallor and rash.  Neurological: Negative for syncope and weakness.  Rest see pertinent positives and negatives per HPI.  Current Outpatient Medications on File Prior to Visit  Medication Sig Dispense Refill  . meloxicam (MOBIC) 15 MG tablet Take 1 tablet (15 mg total) by mouth daily. 30 tablet 0  . pramipexole (MIRAPEX) 0.125 MG tablet Take one to two at night as needed for restless leg symptoms. 60 tablet 5  . SUMAtriptan (IMITREX) 100  MG tablet 1 TABLET BY MOUTH EVERY 2 AS NEEDED FOR MIGRAINE.REPEAT IN 2 HOURS IF HEADACHE PERSISTS OR RECURS. 9 tablet 2  . terbinafine (LAMISIL) 250 MG tablet Take 1 tablet (250 mg total) by mouth daily. 45 tablet 0  . triamcinolone cream (KENALOG) 0.1 % Apply 1 application 2 (two) times daily topically. 30 g 2  . cyclobenzaprine (FLEXERIL) 5 MG tablet Take 1 to 2 tablets every 8 hours as needed for muscle spasm (Patient not taking: Reported on 12/08/2019) 30 tablet 1   No current facility-administered medications on file prior to visit.     Past Medical History:  Diagnosis Date  . HERPES ZOSTER 10/18/2008  . NECK PAIN 02/15/2009  . PARESTHESIA 10/30/2009  . PERS HX TOBACCO USE PRESENTING HAZARDS HEALTH 03/10/2010  . SINUSITIS, ACUTE 05/03/2009  . SORE THROAT 02/12/2009  . TMJ SYNDROME 02/11/2009   Allergies  Allergen Reactions  . Iodine Hives and Swelling  . Shellfish Allergy Hives and Swelling    Social History   Socioeconomic History  . Marital status: Divorced    Spouse name: Not on file  . Number of children: Not on file  . Years of education: Not on file  . Highest education level: Not on file  Occupational History  . Not on file  Tobacco Use  . Smoking status: Current Every Day Smoker    Packs/day: 1.00    Years: 41.00    Pack years: 41.00    Types: Cigarettes  . Smokeless tobacco: Never Used  . Tobacco comment: She and sister are going to work on  quitting together  Vaping Use  . Vaping Use: Never used  Substance and Sexual Activity  . Alcohol use: No    Alcohol/week: 0.0 standard drinks  . Drug use: No  . Sexual activity: Not on file  Other Topics Concern  . Not on file  Social History Narrative  . Not on file   Social Determinants of Health   Financial Resource Strain:   . Difficulty of Paying Living Expenses:   Food Insecurity:   . Worried About Charity fundraiser in the Last Year:   . Arboriculturist in the Last Year:   Transportation Needs:   . Consulting civil engineer (Medical):   Marland Kitchen Lack of Transportation (Non-Medical):   Physical Activity:   . Days of Exercise per Week:   . Minutes of Exercise per Session:   Stress:   . Feeling of Stress :   Social Connections:   . Frequency of Communication with Friends and Family:   . Frequency of Social Gatherings with Friends and Family:   . Attends Religious Services:   . Active Member of Clubs or Organizations:   . Attends Archivist Meetings:   Marland Kitchen Marital Status:     Vitals:   12/08/19 0708  BP: 112/82  Pulse: 64  Resp: 16  Temp: (!) 97.5 F (36.4 C)  SpO2: 99%   Body mass index is 23.58 kg/m.  Physical Exam Vitals and nursing note reviewed.  Constitutional:      General: She is not in acute distress.    Appearance: She is well-developed and normal weight. She is not ill-appearing.  HENT:     Head: Normocephalic and atraumatic.  Eyes:     Conjunctiva/sclera: Conjunctivae normal.  Cardiovascular:     Rate and Rhythm: Normal rate and regular rhythm.     Heart sounds: No murmur heard.      Comments: Calf tenderness is not present. DP pulses present bilateral. PT present, right. Pulmonary:     Effort: Pulmonary effort is normal. No respiratory distress.     Breath sounds: Normal breath sounds.  Musculoskeletal:     Cervical back: No rigidity.     Right hip: No tenderness, bony tenderness or crepitus.     Right upper leg: Tenderness present. No edema, deformity or bony tenderness.     Right lower leg: No edema.       Legs:     Comments: Pain upon palpation mid proximal thigh. No edema or erythema. Mild atrophy of thigh muscles, RLE.  Skin:    General: Skin is warm.     Findings: No erythema or rash.  Neurological:     Mental Status: She is alert and oriented to person, place, and time.     Gait: Gait normal.  Psychiatric:     Comments: Well groomed, good eye contact.   Physical Exam  Nursing note and vitals reviewed. Constitutional: She is oriented to  person, place, and time. She appears well-developed. She does not appear ill. No distress.  HENT:  Head: Normocephalic and atraumatic.  Eyes: Conjunctivae are normal.  Cardiovascular: Normal rate and regular rhythm.  No murmur heard. Calf tenderness is not present. DP pulses present bilateral. PT present, right.  Respiratory: Effort normal and breath sounds normal. No respiratory distress.  Musculoskeletal:     Cervical back: No rigidity.     Right hip: No tenderness, bony tenderness or crepitus.     Right upper leg: Tenderness present. No edema, deformity  or bony tenderness.     Right lower leg: No edema.       Legs:     Comments: Mild atrophy of thigh muscles, RLE.     Neurological: She is alert and oriented to person, place, and time. Gait normal.  Skin: Skin is warm. No rash noted. No erythema.  Psychiatric:  Well groomed, good eye contact.    ASSESSMENT AND PLAN:  Ms.Wendolyn was seen today for leg pain.  Diagnoses and all orders for this visit:  Acute pain of right thigh -     celecoxib (CELEBREX) 100 MG capsule; Take 1 capsule (100 mg total) by mouth 2 (two) times daily for 10 days.  Strain of gluteus medius of right lower extremity, initial encounter   It seems to be musculoskeletal pain. Based on examination today+Hx, problem does not seem to be a serious process.  Other possible etiologies discussed.  I do not think imaging is needed today. Instructed to monitor for new symptoms.  Celebrex side effects discussed. Local heat/ice,and massage may help.  Strain of gluteus medius of right lower extremity, initial encounter Slowly getting better. Hamstring stretching exercises may help. For now she is not interested in PT.  Return if symptoms worsen or fail to improve.   Breannah Kratt G. [place], MD  [facility]. [place] office.  A few things to remember from today's visit:  Acute pain of right thigh - Plan: celecoxib (CELEBREX) 100 MG  capsule  It seems to be muscle. Local ice and heat may help as well as massage. Celebrex 2 times daily for 10 days max. You can take Acetaminophen at the same time. Stretching exercises.  Pulses are present.  If not better, we will need to consider referral to sport med.  Please be sure medication list is accurate. If a new problem present, please set up appointment sooner than planned today.

## 2019-12-08 NOTE — Patient Instructions (Addendum)
A few things to remember from today's visit:  Acute pain of right thigh - Plan: celecoxib (CELEBREX) 100 MG capsule  It seems to be muscle. Local ice and heat may help as well as massage. Celebrex 2 times daily for 10 days max. You can take Acetaminophen at the same time. Stretching exercises.  Pulses are present.  If not better, we will need to consider referral to sport med.  Please be sure medication list is accurate. If a new problem present, please set up appointment sooner than planned today.

## 2020-03-12 ENCOUNTER — Ambulatory Visit
Admission: RE | Admit: 2020-03-12 | Discharge: 2020-03-12 | Disposition: A | Discharge status: Home / Self Care | Payer: 59 | Source: Ambulatory Visit | Attending: Acute Care | Admitting: Acute Care

## 2020-03-12 DIAGNOSIS — F17210 Nicotine dependence, cigarettes, uncomplicated: Secondary | ICD-10-CM

## 2020-03-12 DIAGNOSIS — Z122 Encounter for screening for malignant neoplasm of respiratory organs: Secondary | ICD-10-CM

## 2020-03-14 NOTE — Progress Notes (Signed)
Please call patient and let them  know their  low dose Ct was read as a Lung RADS 2: nodules that are benign in appearance and behavior with a very low likelihood of becoming a clinically active cancer due to size or lack of growth. Recommendation per radiology is for a repeat LDCT in 12 months. .Please let them  know we will order and schedule their  annual screening scan for 02/2021. Please let them  know there was notation of CAD on their  scan.  Please remind the patient  that this is a non-gated exam therefore degree or severity of disease  cannot be determined. Please have them  follow up with their PCP regarding potential risk factor modification, dietary therapy or pharmacologic therapy if clinically indicated. °Pt.  is  not currently on statin therapy. Please place order for annual  screening scan for  02/2021 and fax results to PCP. Thanks so much. °

## 2020-03-15 ENCOUNTER — Other Ambulatory Visit: Payer: Self-pay | Admitting: *Deleted

## 2020-03-15 DIAGNOSIS — F17210 Nicotine dependence, cigarettes, uncomplicated: Secondary | ICD-10-CM

## 2020-05-23 ENCOUNTER — Other Ambulatory Visit: Payer: Self-pay

## 2020-05-24 ENCOUNTER — Encounter: Payer: Self-pay | Admitting: Family Medicine

## 2020-05-24 ENCOUNTER — Ambulatory Visit (INDEPENDENT_AMBULATORY_CARE_PROVIDER_SITE_OTHER): Payer: BC Managed Care – PPO | Admitting: Family Medicine

## 2020-05-24 VITALS — BP 120/82 | HR 68 | Temp 97.7°F | Wt 134.0 lb

## 2020-05-24 DIAGNOSIS — M546 Pain in thoracic spine: Secondary | ICD-10-CM | POA: Diagnosis not present

## 2020-05-24 MED ORDER — MELOXICAM 7.5 MG PO TABS: 7.5000 mg | ORAL_TABLET | Freq: Every day | ORAL | 0 refills | Status: DC

## 2020-05-24 MED ORDER — CYCLOBENZAPRINE HCL 5 MG PO TABS: 5.0000 mg | ORAL_TABLET | Freq: Every evening | ORAL | 0 refills | Status: DC | PRN

## 2020-05-24 NOTE — Patient Instructions (Signed)
Acute Back Pain, Adult Acute back pain is sudden and usually short-lived. It is often caused by an injury to the muscles and tissues in the back. The injury may result from:  A muscle or ligament getting overstretched or torn (strained). Ligaments are tissues that connect bones to each other. Lifting something improperly can cause a back strain.  Wear and tear (degeneration) of the spinal disks. Spinal disks are circular tissue that provides cushioning between the bones of the spine (vertebrae).  Twisting motions, such as while playing sports or doing yard work.  A hit to the back.  Arthritis. You may have a physical exam, lab tests, and imaging tests to find the cause of your pain. Acute back pain usually goes away with rest and home care. Follow these instructions at home: Managing pain, stiffness, and swelling  Take over-the-counter and prescription medicines only as told by your health care provider.  Your health care provider may recommend applying ice during the first 24-48 hours after your pain starts. To do this: ? Put ice in a plastic bag. ? Place a towel between your skin and the bag. ? Leave the ice on for 20 minutes, 2-3 times a day.  If directed, apply heat to the affected area as often as told by your health care provider. Use the heat source that your health care provider recommends, such as a moist heat pack or a heating pad. ? Place a towel between your skin and the heat source. ? Leave the heat on for 20-30 minutes. ? Remove the heat if your skin turns bright red. This is especially important if you are unable to feel pain, heat, or cold. You have a greater risk of getting burned. Activity   Do not stay in bed. Staying in bed for more than 1-2 days can delay your recovery.  Sit up and stand up straight. Avoid leaning forward when you sit, or hunching over when you stand. ? If you work at a desk, sit close to it so you do not need to lean over. Keep your chin tucked  in. Keep your neck drawn back, and keep your elbows bent at a right angle. Your arms should look like the letter "L." ? Sit high and close to the steering wheel when you drive. Add lower back (lumbar) support to your car seat, if needed.  Take short walks on even surfaces as soon as you are able. Try to increase the length of time you walk each day.  Do not sit, drive, or stand in one place for more than 30 minutes at a time. Sitting or standing for long periods of time can put stress on your back.  Do not drive or use heavy machinery while taking prescription pain medicine.  Use proper lifting techniques. When you bend and lift, use positions that put less stress on your back: ? Bend your knees. ? Keep the load close to your body. ? Avoid twisting.  Exercise regularly as told by your health care provider. Exercising helps your back heal faster and helps prevent back injuries by keeping muscles strong and flexible.  Work with a physical therapist to make a safe exercise program, as recommended by your health care provider. Do any exercises as told by your physical therapist. Lifestyle  Maintain a healthy weight. Extra weight puts stress on your back and makes it difficult to have good posture.  Avoid activities or situations that make you feel anxious or stressed. Stress and anxiety increase muscle   tension and can make back pain worse. Learn ways to manage anxiety and stress, such as through exercise. General instructions  Sleep on a firm mattress in a comfortable position. Try lying on your side with your knees slightly bent. If you lie on your back, put a pillow under your knees.  Follow your treatment plan as told by your health care provider. This may include: ? Cognitive or behavioral therapy. ? Acupuncture or massage therapy. ? Meditation or yoga. Contact a health care provider if:  You have pain that is not relieved with rest or medicine.  You have increasing pain going down  into your legs or buttocks.  Your pain does not improve after 2 weeks.  You have pain at night.  You lose weight without trying.  You have a fever or chills. Get help right away if:  You develop new bowel or bladder control problems.  You have unusual weakness or numbness in your arms or legs.  You develop nausea or vomiting.  You develop abdominal pain.  You feel faint. Summary  Acute back pain is sudden and usually short-lived.  Use proper lifting techniques. When you bend and lift, use positions that put less stress on your back.  Take over-the-counter and prescription medicines and apply heat or ice as directed by your health care provider. This information is not intended to replace advice given to you by your health care provider. Make sure you discuss any questions you have with your health care provider. Document Revised: 09/20/2018 Document Reviewed: 01/13/2017 Elsevier Patient Education  2020 [employer].  Thoracic Strain Rehab Ask your health care provider which exercises are safe for you. Do exercises exactly as told by your health care provider and adjust them as directed. It is normal to feel mild stretching, pulling, tightness, or discomfort as you do these exercises. Stop right away if you feel sudden pain or your pain gets worse. Do not begin these exercises until told by your health care provider. Stretching and range-of-motion exercise This exercise warms up your muscles and joints and improves the movement and flexibility of your back and shoulders. This exercise also helps to relieve pain. Chest and spine stretch  1. Lie down on your back on a firm surface. 2. Roll a towel or a small blanket so it is about 4 inches (10 cm) in diameter. 3. Put the towel lengthwise under the middle of your back so it is under your spine, but not under your shoulder blades. 4. Put your hands behind your head and let your elbows fall to your sides. This will increase your  stretch. 5. Take a deep breath (inhale). 6. Hold for __________ seconds. 7. Relax after you breathe out (exhale). Repeat __________ times. Complete this exercise __________ times a day. Strengthening exercises These exercises build strength and endurance in your back and your shoulder blade muscles. Endurance is the ability to use your muscles for a long time, even after they get tired. Alternating arm and leg raises  1. Get on your hands and knees on a firm surface. If you are on a hard floor, you may want to use padding, such as an exercise mat, to cushion your knees. 2. Line up your arms and legs. Your hands should be directly below your shoulders, and your knees should be directly below your hips. 3. Lift your left leg behind you. At the same time, raise your right arm and straighten it in front of you. ? Do not lift your leg  higher than your hip. ? Do not lift your arm higher than your shoulder. ? Keep your abdominal and back muscles tight. ? Keep your hips facing the ground. ? Do not arch your back. ? Keep your balance carefully, and do not hold your breath. 4. Hold for __________ seconds. 5. Slowly return to the starting position and repeat with your right leg and your left arm. Repeat __________ times. Complete this exercise __________ times a day. Straight arm rows This exercise is also called shoulder extension exercise. 1. Stand with your feet shoulder width apart. 2. Secure an exercise band to a stable object in front of you so the band is at or above shoulder height. 3. Hold one end of the exercise band in each hand. 4. Straighten your elbows and lift your hands up to shoulder height. 5. Step back, away from the secured end of the exercise band, until the band stretches. 6. Squeeze your shoulder blades together and pull your hands down to the sides of your thighs. Stop when your hands are straight down by your sides. This is shoulder extension. Do not let your hands go behind  your body. 7. Hold for __________ seconds. 8. Slowly return to the starting position. Repeat __________ times. Complete this exercise __________ times a day. Prone shoulder external rotation 1. Lie on your abdomen on a firm bed so your left / right forearm hangs over the edge of the bed and your upper arm is on the bed, straight out from your body. This is the prone position. ? Your elbow should be bent. ? Your palm should be facing your feet. 2. If instructed, hold a __________ weight in your hand. 3. Squeeze your shoulder blade toward the middle of your back. Do not let your shoulder lift toward your ear. 4. Keep your elbow bent in a 90-degree angle (right angle) while you slowly move your forearm up toward the ceiling. Move your forearm up to the height of the bed, toward your head. This is external rotation. ? Your upper arm should not move. ? At the top of the movement, your palm should face the floor. 5. Hold for __________ seconds. 6. Slowly return to the starting position and relax your muscles. Repeat __________ times. Complete this exercise __________ times a day. Rowing scapular retraction This is an exercise in which the shoulder blades (scapulae) are pulled toward each other (retraction). 1. Sit in a stable chair without armrests, or stand up. 2. Secure an exercise band to a stable object in front of you so the band is at shoulder height. 3. Hold one end of the exercise band in each hand. Your palms should face down. 4. Bring your arms out straight in front of you. 5. Step back, away from the secured end of the exercise band, until the band stretches. 6. Pull the band backward. As you do this, bend your elbows and squeeze your shoulder blades together, but avoid letting the rest of your body move. Do not shrug your shoulders upward while you do this. 7. Stop when your elbows are at your sides or slightly behind your body. 8. Hold for __________ seconds. 9. Slowly straighten  your arms to return to the starting position. Repeat __________ times. Complete this exercise __________ times a day. Posture and body mechanics Good posture and healthy body mechanics can help to relieve stress in your body's tissues and joints. Body mechanics refers to the movements and positions of your body while you do your daily  activities. Posture is part of body mechanics. Good posture means:  Your spine is in its natural S-curve position (neutral).  Your shoulders are pulled back slightly.  Your head is not tipped forward. Follow these guidelines to improve your posture and body mechanics in your everyday activities. Standing   When standing, keep your spine neutral and your feet about hip width apart. Keep a slight bend in your knees. Your ears, shoulders, and hips should line up with each other.  When you do a task in which you lean forward while standing in one place for a long time, place one foot up on a stable object that is 2-4 inches (5-10 cm) high, such as a footstool. This helps keep your spine neutral. Sitting   When sitting, keep your spine neutral and keep your feet flat on the floor. Use a footrest, if necessary, and keep your thighs parallel to the floor. Avoid rounding your shoulders, and avoid tilting your head forward.  When working at a desk or a computer, keep your desk at a height where your hands are slightly lower than your elbows. Slide your chair under your desk so you are close enough to maintain good posture.  When working at a computer, place your monitor at a height where you are looking straight ahead and you do not have to tilt your head forward or downward to look at the screen. Resting When lying down and resting, avoid positions that are most painful for you.  If you have pain with activities such as sitting, bending, stooping, or squatting (flexion-basedactivities), lie in a position in which your body does not bend very much. For example, avoid  curling up on your side with your arms and knees near your chest (fetal position).  If you have pain with activities such as standing for a long time or reaching with your arms (extension-basedactivities), lie with your spine in a neutral position and bend your knees slightly. Try the following positions: ? Lie on your side with a pillow between your knees. ? Lie on your back with a pillow under your knees.  Lifting   When lifting objects, keep your feet at least shoulder width apart and tighten your abdominal muscles.  Bend your knees and hips and keep your spine neutral. It is important to lift using the strength of your legs, not your back. Do not lock your knees straight out.  Always ask for help to lift heavy or awkward objects. This information is not intended to replace advice given to you by your health care provider. Make sure you discuss any questions you have with your health care provider. Document Revised: 09/23/2018 Document Reviewed: 07/11/2018 Elsevier Patient Education  [place].

## 2020-05-24 NOTE — Progress Notes (Signed)
Subjective:    Patient ID: Ann Lee, female    DOB: 1955/09/05, 64 y.o.   MRN: [id number]  No chief complaint on file.   HPI Patient is a 64 year old female with past medical history significant for migraines, TMJ syndrome, cell carcinoma, tobacco use  h/o shingles who is followed by Dr. Elease Hashimoto and seen today for acute concern.  Patient endorses mid back pain x3 days.  Symptoms started Tuesday evening after pt lifted and boxed toys for Toy for [facility].  Pt endorses a burning sensation across the mid back, worse at night when trying to lay down.  Patient tried heat, Tylenol PM, and Advil.  Patient denies loss of bowel or bladder, LE weakness numbness, or tingling.  Patient endorses being sensitive to medications that make her sleepy.  Past Medical History:  Diagnosis Date  . HERPES ZOSTER 10/18/2008  . NECK PAIN 02/15/2009  . PARESTHESIA 10/30/2009  . PERS HX TOBACCO USE PRESENTING HAZARDS HEALTH 03/10/2010  . SINUSITIS, ACUTE 05/03/2009  . SORE THROAT 02/12/2009  . TMJ SYNDROME 02/11/2009    Allergies  Allergen Reactions  . Iodine Hives and Swelling  . Shellfish Allergy Hives and Swelling    ROS General: Denies fever, chills, night sweats, changes in weight, changes in appetite HEENT: Denies headaches, ear pain, changes in vision, rhinorrhea, sore throat CV: Denies CP, palpitations, SOB, orthopnea Pulm: Denies SOB, cough, wheezing GI: Denies abdominal pain, nausea, vomiting, diarrhea, constipation GU: Denies dysuria, hematuria, frequency, vaginal discharge Msk: Denies muscle cramps, joint pains +back pain Neuro: Denies weakness, numbness, tingling Skin: Denies rashes, bruising Psych: Denies depression, anxiety, hallucinations      Objective:    Blood pressure 120/82, pulse 68, temperature 97.7 F (36.5 C), temperature source Oral, weight 134 lb (60.8 kg), SpO2 96 %.  Gen. Pleasant, well-nourished, in no distress, normal affect   HEENT: Wearing glasses, [place]/AT, face  symmetric, conjunctiva clear, no scleral icterus, PERRLA, EOMI, nares patent without drainage Lungs: no accessory muscle use, CTAB, no wheezes or rales Cardiovascular: RRR, no m/r/g, no peripheral edema Musculoskeletal: No TTP of cervical, thoracic, lumbar spine.  Mild TTP of lower thoracic paraspinal muscles bilaterally.  Mild discomfort with flexion and extension of spine.  Negative straight leg raise, logroll, FADIR, FABER b/l.  No deformities, no cyanosis or clubbing, normal tone Neuro:  A&Ox3, CN II-XII intact, normal gait Skin:  Warm, no lesions/ rash  Wt Readings from Last 3 Encounters:  05/24/20 134 lb (60.8 kg)  12/08/19 131 lb (59.4 kg)  11/28/19 132 lb (59.9 kg)    Lab Results  Component Value Date   WBC 6.7 06/22/2018   HGB 14.9 06/22/2018   HCT 44.1 06/22/2018   PLT 216.0 06/22/2018   GLUCOSE 85 06/20/2010   CHOL 205 (H) 06/20/2010   TRIG 184.0 (H) 06/20/2010   HDL 27.60 (L) 06/20/2010   LDLDIRECT 147.9 06/20/2010   ALT 9 05/05/2017   AST 13 05/05/2017   NA 141 06/20/2010   K 4.4 06/20/2010   CL 105 06/20/2010   CREATININE 0.8 06/20/2010   BUN 10 06/20/2010   CO2 29 06/20/2010   TSH 2.03 06/20/2010    Assessment/Plan:  Acute bilateral thoracic back pain  -Symptoms likely 2/2 trapezius/latissimus dorsi muscle strain from overuse. -Discussed supportive care.  Continue heat, garnish, ice, topical analgesic, NSAIDs. -Discussed stretching exercises.  Given handout -From prescription for Flexeril nightly as needed and Mobic. - Plan: cyclobenzaprine (FLEXERIL) 5 MG tablet, meloxicam (MOBIC) 7.5 MG tablet  F/u as needed  Ygnacio Fecteau, MD 

## 2020-05-29 ENCOUNTER — Other Ambulatory Visit: Payer: Self-pay

## 2020-05-29 ENCOUNTER — Ambulatory Visit: Payer: BC Managed Care – PPO | Admitting: Family Medicine

## 2020-05-29 ENCOUNTER — Encounter: Payer: Self-pay | Admitting: Family Medicine

## 2020-05-29 ENCOUNTER — Other Ambulatory Visit: Payer: BC Managed Care – PPO

## 2020-05-29 VITALS — BP 136/78 | HR 73 | Temp 97.8°F | Ht 62.5 in | Wt 135.0 lb

## 2020-05-29 DIAGNOSIS — M546 Pain in thoracic spine: Secondary | ICD-10-CM | POA: Diagnosis not present

## 2020-05-29 DIAGNOSIS — G43909 Migraine, unspecified, not intractable, without status migrainosus: Secondary | ICD-10-CM | POA: Diagnosis not present

## 2020-05-29 DIAGNOSIS — R142 Eructation: Secondary | ICD-10-CM

## 2020-05-29 MED ORDER — SUMATRIPTAN SUCCINATE 25 MG PO TABS: ORAL_TABLET | ORAL | 11 refills | Status: DC

## 2020-05-29 NOTE — Progress Notes (Signed)
Established Patient Office Visit  Subjective:  Patient ID: Ann Lee, female    DOB: Apr 13, 1956  Age: 64 y.o. MRN: [id number]  CC:  Chief Complaint  Patient presents with  . Back Pain    Patient complains of recurrent back pain x8 days ago, states she was doing a lot of heavy lifting prior to this, seen by Dr Volanda Napoleon last week  . Medication Problem    Patient states she used to take Imitrex 100mg -1/3 if a tablet and the manufacturer changed to a small triangular shape and she is not able to cu this, states pharmacist recommended Imitrex 25mg  instead    HPI Ann Lee presents for the following issues  History of migraine headaches.  For years she has taken basically a third of 100 mg Imitrex tablet but her current tablets are triangular-shaped and she has had difficulty dividing these.  She is requesting prescription for Imitrex 25 mg instead which she takes generally about twice per month for migraine headaches  She has had some thoracic back pain bilaterally but left greater than right for past 8 days.  She did some lifting of boxes but denies specific injury.  Pain is not reproducible in terms of movement or reaching or lifting.  She has noticed increased belching recently.  No abdominal pain.  No chest pains.  No dyspnea.  She was prescribed muscle relaxer recently but this did not seem to help any.  Symptoms seem to be worse when she is lying supine.  Appetite is stable.  Weight stable.  No fever.  No cough.  She does have a longstanding history of nicotine use.  She had recent low-dose CT lung cancer screening in September which was unremarkable  Past Medical History:  Diagnosis Date  . HERPES ZOSTER 10/18/2008  . NECK PAIN 02/15/2009  . PARESTHESIA 10/30/2009  . PERS HX TOBACCO USE PRESENTING HAZARDS HEALTH 03/10/2010  . SINUSITIS, ACUTE 05/03/2009  . SORE THROAT 02/12/2009  . TMJ SYNDROME 02/11/2009    History reviewed. No pertinent surgical history.  Family History   Problem Relation Age of Onset  . Cancer Mother        esophageal  . Arthritis Mother   . Hyperlipidemia Father   . Arthritis Father   . Cancer Father        colon    Social History   Socioeconomic History  . Marital status: Divorced    Spouse name: Not on file  . Number of children: Not on file  . Years of education: Not on file  . Highest education level: Not on file  Occupational History  . Not on file  Tobacco Use  . Smoking status: Current Every Day Smoker    Packs/day: 1.00    Years: 41.00    Pack years: 41.00    Types: Cigarettes  . Smokeless tobacco: Never Used  . Tobacco comment: She and sister are going to work on quitting together  Media planner  . Vaping Use: Never used  Substance and Sexual Activity  . Alcohol use: No    Alcohol/week: 0.0 standard drinks  . Drug use: No  . Sexual activity: Not on file  Other Topics Concern  . Not on file  Social History Narrative  . Not on file   Social Determinants of Health   Financial Resource Strain: Not on file  Food Insecurity: Not on file  Transportation Needs: Not on file  Physical Activity: Not on file  Stress: Not on file  Social  Connections: Not on file  Intimate Partner Violence: Not on file    Outpatient Medications Prior to Visit  Medication Sig Dispense Refill  . cyclobenzaprine (FLEXERIL) 5 MG tablet Take 1 tablet (5 mg total) by mouth at bedtime as needed for muscle spasms. 20 tablet 0  . meloxicam (MOBIC) 7.5 MG tablet Take 1 tablet (7.5 mg total) by mouth daily. 20 tablet 0  . pramipexole (MIRAPEX) 0.125 MG tablet Take one to two at night as needed for restless leg symptoms. 60 tablet 5  . terbinafine (LAMISIL) 250 MG tablet Take 1 tablet (250 mg total) by mouth daily. 45 tablet 0  . triamcinolone cream (KENALOG) 0.1 % Apply 1 application 2 (two) times daily topically. 30 g 2  . SUMAtriptan (IMITREX) 100 MG tablet 1 TABLET BY MOUTH EVERY 2 AS NEEDED FOR MIGRAINE.REPEAT IN 2 HOURS IF HEADACHE  PERSISTS OR RECURS. 9 tablet 2   No facility-administered medications prior to visit.    Allergies  Allergen Reactions  . Iodine Hives and Swelling  . Shellfish Allergy Hives and Swelling    ROS Review of Systems  Constitutional: Negative for appetite change, chills, fever and unexpected weight change.  Respiratory: Negative for cough, shortness of breath and wheezing.   Cardiovascular: Negative for chest pain.  Gastrointestinal: Negative for abdominal pain.  Genitourinary: Negative for dysuria and hematuria.  Musculoskeletal: Positive for back pain.  Skin: Negative for rash.  Neurological: Negative for dizziness.  Hematological: Negative for adenopathy.      Objective:    Physical Exam Vitals reviewed.  Constitutional:      Appearance: Normal appearance.  Cardiovascular:     Rate and Rhythm: Normal rate and regular rhythm.  Pulmonary:     Effort: Pulmonary effort is normal.     Breath sounds: Normal breath sounds. No wheezing or rales.  Abdominal:     Palpations: Abdomen is soft.     Tenderness: There is no abdominal tenderness.  Musculoskeletal:     Comments: No spinal tenderness.  No thoracic tenderness to palpation.   Skin:    Findings: No rash.  Neurological:     Mental Status: She is alert.     BP 136/78 (BP Location: Right Arm, Patient Position: Sitting, Cuff Size: Normal)   Pulse 73   Temp 97.8 F (36.6 C) (Oral)   Ht 5' 2.5" (1.588 m)   Wt 135 lb (61.2 kg)   BMI 24.30 kg/m  Wt Readings from Last 3 Encounters:  05/29/20 135 lb (61.2 kg)  05/24/20 134 lb (60.8 kg)  12/08/19 131 lb (59.4 kg)     Health Maintenance Due  Topic Date Due  . Hepatitis C Screening  Never done  . HIV Screening  Never done  . COLONOSCOPY  Never done  . MAMMOGRAM  09/13/2008  . PAP SMEAR-Modifier  02/14/2011  . TETANUS/TDAP  09/14/2018  . COVID-19 Vaccine (3 - Pfizer risk 4-dose series) 08/14/2019    There are no preventive care reminders to display for this  patient.  Lab Results  Component Value Date   TSH 2.03 06/20/2010   Lab Results  Component Value Date   WBC 6.7 06/22/2018   HGB 14.9 06/22/2018   HCT 44.1 06/22/2018   MCV 88.9 06/22/2018   PLT 216.0 06/22/2018   Lab Results  Component Value Date   NA 141 06/20/2010   K 4.4 06/20/2010   CO2 29 06/20/2010   GLUCOSE 85 06/20/2010   BUN 10 06/20/2010   CREATININE 0.8 06/20/2010  BILITOT 0.5 05/05/2017   ALKPHOS 77 05/05/2017   AST 13 05/05/2017   ALT 9 05/05/2017   PROT 5.9 (L) 05/05/2017   ALBUMIN 3.8 05/05/2017   CALCIUM 9.2 06/20/2010   GFR 85.90 06/20/2010   Lab Results  Component Value Date   CHOL 205 (H) 06/20/2010   Lab Results  Component Value Date   HDL 27.60 (L) 06/20/2010   No results found for: [facility] Lab Results  Component Value Date   TRIG 184.0 (H) 06/20/2010   Lab Results  Component Value Date   CHOLHDL 7 06/20/2010   No results found for: HGBA1C    Assessment & Plan:   #1 history of migraine headaches overall stable -Refilled Imitrex at 25 mg take 1 to 2 tablets at onset of migraine and repeat 1-2 and 2 hours as needed  #2 thoracic back pain left greater than right.  This sounds very likely musculoskeletal but not reproducible on exam.  Question is whether this is radiating from somewhere else.  She denies any specific injury.  She denies any red flags such as fever, dyspnea, weight loss, etc.  -We will check some labs with CBC, CMP, sed rate. -We discussed possible imaging thoracic spine but since she has no reproducible tenderness suspect this would be low yield. -Consider trial of an acid such as Nexium or Prilosec (for belching) -Follow-up immediately for any dyspnea, chest pains, abdominal pain, vomiting  Meds ordered this encounter  Medications  . SUMAtriptan (IMITREX) 25 MG tablet    Sig: Take one at onset of migraine and may repeat one in two hours as needed (max of 2 in 24 hours)    Dispense:  10 tablet    Refill:  11     Follow-up: No follow-ups on file.    Carolann Littler, MD

## 2020-05-30 ENCOUNTER — Other Ambulatory Visit (INDEPENDENT_AMBULATORY_CARE_PROVIDER_SITE_OTHER): Payer: BC Managed Care – PPO

## 2020-05-30 DIAGNOSIS — M546 Pain in thoracic spine: Secondary | ICD-10-CM | POA: Diagnosis not present

## 2020-05-31 LAB — COMPREHENSIVE METABOLIC PANEL WITH GFR
AG Ratio: 1.8 (calc) (ref 1.0–2.5)
ALT: 12 U/L (ref 6–29)
AST: 15 U/L (ref 10–35)
Albumin: 4 g/dL (ref 3.6–5.1)
Alkaline phosphatase (APISO): 72 U/L (ref 37–153)
BUN: 12 mg/dL (ref 7–25)
CO2: 29 mmol/L (ref 20–32)
Calcium: 9.2 mg/dL (ref 8.6–10.4)
Chloride: 106 mmol/L (ref 98–110)
Creat: 0.87 mg/dL (ref 0.50–0.99)
Globulin: 2.2 g/dL (ref 1.9–3.7)
Glucose, Bld: 98 mg/dL (ref 65–99)
Potassium: 4.5 mmol/L (ref 3.5–5.3)
Sodium: 139 mmol/L (ref 135–146)
Total Bilirubin: 0.6 mg/dL (ref 0.2–1.2)
Total Protein: 6.2 g/dL (ref 6.1–8.1)

## 2020-05-31 LAB — CBC WITH DIFFERENTIAL/PLATELET
Absolute Monocytes: 600 cells/uL (ref 200–950)
Basophils Absolute: 47 cells/uL (ref 0–200)
Basophils Relative: 0.6 %
Eosinophils Absolute: 174 cells/uL (ref 15–500)
Eosinophils Relative: 2.2 %
HCT: 46.2 % — ABNORMAL HIGH (ref 35.0–45.0)
Hemoglobin: 15.2 g/dL (ref 11.7–15.5)
Lymphs Abs: 1754 cells/uL (ref 850–3900)
MCH: 29.7 pg (ref 27.0–33.0)
MCHC: 32.9 g/dL (ref 32.0–36.0)
MCV: 90.2 fL (ref 80.0–100.0)
MPV: 10.4 fL (ref 7.5–12.5)
Monocytes Relative: 7.6 %
Neutro Abs: 5325 cells/uL (ref 1500–7800)
Neutrophils Relative %: 67.4 %
Platelets: 229 10*3/uL (ref 140–400)
RBC: 5.12 10*6/uL — ABNORMAL HIGH (ref 3.80–5.10)
RDW: 12.6 % (ref 11.0–15.0)
Total Lymphocyte: 22.2 %
WBC: 7.9 10*3/uL (ref 3.8–10.8)

## 2020-05-31 LAB — SEDIMENTATION RATE: Sed Rate: 6 mm/h (ref 0–30)

## 2020-08-05 ENCOUNTER — Encounter: Payer: Self-pay | Admitting: Family Medicine

## 2020-08-05 ENCOUNTER — Other Ambulatory Visit: Payer: Self-pay

## 2020-08-05 ENCOUNTER — Ambulatory Visit: Payer: BC Managed Care – PPO | Admitting: Family Medicine

## 2020-08-05 VITALS — BP 122/80 | HR 72 | Ht 62.5 in | Wt 135.0 lb

## 2020-08-05 DIAGNOSIS — R0789 Other chest pain: Secondary | ICD-10-CM | POA: Diagnosis not present

## 2020-08-05 NOTE — Patient Instructions (Signed)

## 2020-08-05 NOTE — Progress Notes (Signed)
Established Patient Office Visit  Subjective:  Patient ID: Ann Lee, female    DOB: February 19, 1956  Age: 65 y.o. MRN: [id number]  CC:  Chief Complaint  Patient presents with  . Flank Pain    Left side    HPI Ann Lee presents for atypical chest symptoms.  She states that last Thursday she felt poor in general.  By Friday she noticed some left hand numbness and some mild pain left upper thigh.  No swelling left lower extremity.  No upper extremity weakness.  Over the weekend she had some sharp fleeting chest pain left-sided chest lasting just seconds.  Not exertional.  No dyspnea.  No cough.  No pleuritic pain.  She noticed some soreness to touch left side of sternum.  Denies any injury.  She noticed some recent increased burping.  No dysphagia.  No pain with swallowing.  No recent GERD symptoms.  Denies any recent appetite or weight changes.  No cough.  She had recent low-dose CT lung cancer screening which was unremarkable.  Past Medical History:  Diagnosis Date  . HERPES ZOSTER 10/18/2008  . NECK PAIN 02/15/2009  . PARESTHESIA 10/30/2009  . PERS HX TOBACCO USE PRESENTING HAZARDS HEALTH 03/10/2010  . SINUSITIS, ACUTE 05/03/2009  . SORE THROAT 02/12/2009  . TMJ SYNDROME 02/11/2009    No past surgical history on file.  Family History  Problem Relation Age of Onset  . Cancer Mother        esophageal  . Arthritis Mother   . Hyperlipidemia Father   . Arthritis Father   . Cancer Father        colon    Social History   Socioeconomic History  . Marital status: Divorced    Spouse name: Not on file  . Number of children: Not on file  . Years of education: Not on file  . Highest education level: Not on file  Occupational History  . Not on file  Tobacco Use  . Smoking status: Current Every Day Smoker    Packs/day: 1.00    Years: 41.00    Pack years: 41.00    Types: Cigarettes  . Smokeless tobacco: Never Used  . Tobacco comment: She and sister are going to work on  quitting together  Media planner  . Vaping Use: Never used  Substance and Sexual Activity  . Alcohol use: No    Alcohol/week: 0.0 standard drinks  . Drug use: No  . Sexual activity: Not on file  Other Topics Concern  . Not on file  Social History Narrative  . Not on file   Social Determinants of Health   Financial Resource Strain: Not on file  Food Insecurity: Not on file  Transportation Needs: Not on file  Physical Activity: Not on file  Stress: Not on file  Social Connections: Not on file  Intimate Partner Violence: Not on file    Outpatient Medications Prior to Visit  Medication Sig Dispense Refill  . SUMAtriptan (IMITREX) 25 MG tablet Take one at onset of migraine and may repeat one in two hours as needed (max of 2 in 24 hours) 10 tablet 11  . cyclobenzaprine (FLEXERIL) 5 MG tablet Take 1 tablet (5 mg total) by mouth at bedtime as needed for muscle spasms. 20 tablet 0  . meloxicam (MOBIC) 7.5 MG tablet Take 1 tablet (7.5 mg total) by mouth daily. 20 tablet 0  . pramipexole (MIRAPEX) 0.125 MG tablet Take one to two at night as needed for restless leg  symptoms. 60 tablet 5  . terbinafine (LAMISIL) 250 MG tablet Take 1 tablet (250 mg total) by mouth daily. 45 tablet 0  . triamcinolone cream (KENALOG) 0.1 % Apply 1 application 2 (two) times daily topically. 30 g 2   No facility-administered medications prior to visit.    Allergies  Allergen Reactions  . Iodine Hives and Swelling  . Shellfish Allergy Hives and Swelling    ROS Review of Systems  Constitutional: Negative for appetite change, chills, fever and unexpected weight change.  Respiratory: Negative for cough, shortness of breath and wheezing.   Cardiovascular: Negative for palpitations and leg swelling.       See HPI  Gastrointestinal: Negative for abdominal pain, nausea and vomiting.  Neurological: Negative for dizziness.      Objective:    Physical Exam Vitals reviewed.  Constitutional:      Appearance:  Normal appearance.  Cardiovascular:     Rate and Rhythm: Normal rate and regular rhythm.  Pulmonary:     Effort: Pulmonary effort is normal.     Breath sounds: Normal breath sounds. No wheezing or rales.     Comments: She does have some mild to moderate tenderness at the costochondral junction left side of sternum palpation Abdominal:     Palpations: Abdomen is soft.     Tenderness: There is no abdominal tenderness.  Musculoskeletal:     Cervical back: Neck supple.     Right lower leg: No edema.     Left lower leg: No edema.  Lymphadenopathy:     Cervical: No cervical adenopathy.  Neurological:     Mental Status: She is alert.     Comments: Normal sensory function to touch upper extremities.  Full strength throughout.  2+ reflexes upper extremities     BP 122/80   Pulse 72   Ht 5' 2.5" (1.588 m)   Wt 135 lb (61.2 kg)   SpO2 98%   BMI 24.30 kg/m  Wt Readings from Last 3 Encounters:  08/05/20 135 lb (61.2 kg)  05/29/20 135 lb (61.2 kg)  05/24/20 134 lb (60.8 kg)     Health Maintenance Due  Topic Date Due  . Hepatitis C Screening  Never done  . HIV Screening  Never done  . COLONOSCOPY (Pts 45-67yrs Insurance coverage will need to be confirmed)  Never done  . MAMMOGRAM  09/13/2008  . PAP SMEAR-Modifier  02/14/2011  . TETANUS/TDAP  09/14/2018  . COVID-19 Vaccine (3 - Pfizer risk 4-dose series) 08/14/2019    There are no preventive care reminders to display for this patient.  Lab Results  Component Value Date   TSH 2.03 06/20/2010   Lab Results  Component Value Date   WBC 7.9 05/30/2020   HGB 15.2 05/30/2020   HCT 46.2 (H) 05/30/2020   MCV 90.2 05/30/2020   PLT 229 05/30/2020   Lab Results  Component Value Date   NA 139 05/30/2020   K 4.5 05/30/2020   CO2 29 05/30/2020   GLUCOSE 98 05/30/2020   BUN 12 05/30/2020   CREATININE 0.87 05/30/2020   BILITOT 0.6 05/30/2020   ALKPHOS 77 05/05/2017   AST 15 05/30/2020   ALT 12 05/30/2020   PROT 6.2 05/30/2020    ALBUMIN 3.8 05/05/2017   CALCIUM 9.2 05/30/2020   GFR 85.90 06/20/2010   Lab Results  Component Value Date   CHOL 205 (H) 06/20/2010   Lab Results  Component Value Date   HDL 27.60 (L) 06/20/2010   No results found  for: [facility] Lab Results  Component Value Date   TRIG 184.0 (H) 06/20/2010   Lab Results  Component Value Date   CHOLHDL 7 06/20/2010   No results found for: HGBA1C    Assessment & Plan:   Atypical chest symptoms.  She describes fleeting sharp pain left-sided chest which is not related to exertion.  This does not sound cardiac.  She had recent low-dose CT lung cancer screening which is unremarkable.  Does have moderate risk factors with age and smoking history.  She does have some reproducible tenderness to palpation at the rib sternal junction on the left side  -Check EKG-sinus rhythm with no acute changes -Consider short-term use of nonsteroidal for her chest wall tenderness -Follow-up immediately for any exertional symptoms or other concerns -Strongly encouraged to stop smoking  No orders of the defined types were placed in this encounter.   Follow-up: No follow-ups on file.    Carolann Littler, MD

## 2020-09-26 ENCOUNTER — Ambulatory Visit: Payer: Self-pay

## 2020-09-26 ENCOUNTER — Ambulatory Visit: Payer: BC Managed Care – PPO | Admitting: Family Medicine

## 2020-09-26 ENCOUNTER — Encounter: Payer: Self-pay | Admitting: Family Medicine

## 2020-09-26 ENCOUNTER — Other Ambulatory Visit: Payer: Self-pay

## 2020-09-26 ENCOUNTER — Encounter: Payer: Self-pay | Admitting: Surgery

## 2020-09-26 ENCOUNTER — Ambulatory Visit: Payer: BC Managed Care – PPO | Admitting: Surgery

## 2020-09-26 VITALS — Ht 62.5 in | Wt 135.0 lb

## 2020-09-26 VITALS — BP 118/72 | HR 67 | Temp 98.5°F | Wt 135.0 lb

## 2020-09-26 DIAGNOSIS — S63502A Unspecified sprain of left wrist, initial encounter: Secondary | ICD-10-CM | POA: Diagnosis not present

## 2020-09-26 DIAGNOSIS — M25532 Pain in left wrist: Secondary | ICD-10-CM

## 2020-09-26 DIAGNOSIS — M654 Radial styloid tenosynovitis [de Quervain]: Secondary | ICD-10-CM | POA: Diagnosis not present

## 2020-09-26 MED ORDER — KETOROLAC TROMETHAMINE 60 MG/2ML IM SOLN
60.0000 mg | Freq: Once | INTRAMUSCULAR | Status: AC
  Administered 2020-09-26: 60 mg via INTRAMUSCULAR

## 2020-09-26 MED ORDER — TRAMADOL HCL 50 MG PO TABS: 50.0000 mg | ORAL_TABLET | Freq: Four times a day (QID) | ORAL | 0 refills | Status: DC | PRN

## 2020-09-26 MED ORDER — HYDROCODONE-ACETAMINOPHEN 5-325 MG PO TABS: 1.0000 | ORAL_TABLET | Freq: Three times a day (TID) | ORAL | 0 refills | Status: DC | PRN

## 2020-09-26 MED ORDER — METHYLPREDNISOLONE ACETATE 40 MG/ML IJ SUSP: 40.0000 mg | Freq: Once | INTRAMUSCULAR | Status: DC

## 2020-09-26 MED ORDER — METHYLPREDNISOLONE 4 MG PO TABS
ORAL_TABLET | ORAL | 0 refills | Status: DC
Start: 2020-09-26 — End: 2020-11-01

## 2020-09-26 NOTE — Progress Notes (Signed)
   Subjective:    Patient ID: Ann Lee, female    DOB: Jun 21, 1955, 65 y.o.   MRN: [id number]  HPI Here for an injury to the left wrist that occurred yesterday afternoon. She was loading some heavy books into her car, and while she was attempting to open her trunk, her hand slipped off the trunk and was violently twisted. She had immediate pain in the wrist, so she went home and iced it. She has been resting and icing it since then, and taking Tylenol. She still has significant pain in the wrist and any motion is painful. No numbness or tingling in the fingers.    Review of Systems  Constitutional: Negative.   Respiratory: Negative.   Cardiovascular: Negative.   Musculoskeletal: Positive for arthralgias.       Objective:   Physical Exam Constitutional:      Comments: In pain   Cardiovascular:     Rate and Rhythm: Normal rate and regular rhythm.     Pulses: Normal pulses.     Heart sounds: Normal heart sounds.  Pulmonary:     Effort: Pulmonary effort is normal.     Breath sounds: Normal breath sounds.  Musculoskeletal:     Comments: The left wrist is mildly swollen, not red or warm. She is quite tender on the dorsal wrist, at the base of the thumb, and along the 3rd and 4th metatarsals. ROM is limited by pain. The median nerve appears to be intact,she can make an "OK sign".   Neurological:     Mental Status: She is alert.           Assessment & Plan:  Left wrist sprain. We will immobilize this with a splint. Given a shot of Toradol for pain. Refer to Orthopedics to see her sometime today.  Alysia Penna, MD

## 2020-09-26 NOTE — Progress Notes (Signed)
Office Visit Note   Patient: Ann Lee           Date of Birth: [birth year]           MRN: [id number] Visit Date: 09/26/2020              Requested by: Laurey Morale, MD [place],  [place] [id number] PCP: Eulas Post, MD   Assessment & Plan: Visit Diagnoses:  1. Pain in left wrist   2. Tendinitis, de Quervain's     Plan: With patient's diffuse acute left wrist pain I am somewhat unsure as to what this may be.  She did not have a true injury that would give her this type of pain.  Today I had blood work drawn to check a CBC and arthritis panel.  Patient was also given a Depo-Medrol 40 mg IM injection today in the clinic.  I sent in prescriptions for Medrol Dosepak 6-day taper to be taken as directed which I would like her to start tomorrow and I sent Norco 5/325 1 tab p.o. every 8 hours as needed for pain.  I will have patient follow-up in the office next Wednesday for recheck.  If she continues to be symptomatic I may get Dr. Marlou Sa to look at her with me to see what he thinks.   Follow-Up Instructions: Return in about 6 days (around 10/02/2020) for with Gloristine Turrubiates for recheck left wrist.  .   Orders:  Orders Placed This Encounter  Procedures   XR Wrist Complete Left   CBC With Differential   Antinuclear Antib (ANA)   Rheumatoid Factor   Uric acid   Sed Rate (ESR)   No orders of the defined types were placed in this encounter.     Procedures: No procedures performed   Clinical Data: No additional findings.   Subjective: Chief Complaint  Patient presents with   Left Wrist - Pain    HPI 65 year old white female who is new patient to clinic comes in today with complaints of acute sudden onset left wrist pain.  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  She does not really recall any specific injury doing this.  Later on that day she was lifting a handle on her trunk when she pulled awkwardly and states  that this may have aggravated some of her discomfort.  Currently she is complaining of constant pain throughout her wrist dorsally and radial aspect.  She is also complaining of pain throughout her hand and across the second third fourth and fifth MCP joints.  Pain increased with bending her fingers and movement of her wrist.  She denies personal history of inflammatory arthropathy.  She has never had pain like this in the past.  She was seen by her PCP today who gave her a Toradol IM injection.  He sent her over for evaluation of wrist sprain. Review of Systems No current cardiac pulmonary GI GU issues  Objective: Vital Signs: Ht 5' 2.5" (1.588 m)   Wt 135 lb (61.2 kg)   BMI 24.30 kg/m   Physical Exam HENT:     Head: Atraumatic.  Eyes:     Extraocular Movements: Extraocular movements intact.  Pulmonary:     Effort: No respiratory distress.  Musculoskeletal:        General: Tenderness (left wrist) present.  Neurological:     Mental Status: She is alert and oriented to person, place, and time.  Psychiatric:  Behavior: Behavior normal.    Ortho Exam  Specialty Comments:  No specialty comments available.  Imaging: No results found.   PMFS History: Patient Active Problem List   Diagnosis Date Noted   SCCA (squamous cell carcinoma) of skin 10/06/2015   Rash 02/23/2015   Pain of left great toe 06/03/2013   Migraine headache 11/28/2012   PERS HX TOBACCO USE PRESENTING HAZARDS HEALTH 03/10/2010   PARESTHESIA 10/30/2009   SINUSITIS, ACUTE 05/03/2009   NECK PAIN 02/15/2009   SORE THROAT 02/12/2009   TMJ SYNDROME 02/11/2009   HERPES ZOSTER 10/18/2008   Past Medical History:  Diagnosis Date   HERPES ZOSTER 10/18/2008   NECK PAIN 02/15/2009   PARESTHESIA 10/30/2009   PERS HX TOBACCO USE PRESENTING HAZARDS HEALTH 03/10/2010   SINUSITIS, ACUTE 05/03/2009   SORE THROAT 02/12/2009   TMJ SYNDROME 02/11/2009    Family History  Problem Relation Age of Onset   Cancer Mother         esophageal   Arthritis Mother    Hyperlipidemia Father    Arthritis Father    Cancer Father        colon    History reviewed. No pertinent surgical history. Social History   Occupational History   Not on file  Tobacco Use   Smoking status: Current Every Day Smoker    Packs/day: 1.00    Years: 41.00    Pack years: 41.00    Types: Cigarettes   Smokeless tobacco: Never Used   Tobacco comment: She and sister are going to work on quitting together  Scientific laboratory technician Use: Never used  Substance and Sexual Activity   Alcohol use: No    Alcohol/week: 0.0 standard drinks   Drug use: No   Sexual activity: Not on file

## 2020-09-26 NOTE — Addendum Note (Signed)
Addended by: Wyvonne Lenz on: 09/26/2020 11:20 AM   Modules accepted: Orders

## 2020-10-01 DIAGNOSIS — M25532 Pain in left wrist: Secondary | ICD-10-CM | POA: Diagnosis not present

## 2020-10-01 LAB — CBC WITH DIFFERENTIAL/PLATELET
Absolute Monocytes: 708 cells/uL (ref 200–950)
Basophils Absolute: 62 cells/uL (ref 0–200)
Basophils Relative: 0.8 %
Eosinophils Absolute: 162 cells/uL (ref 15–500)
Eosinophils Relative: 2.1 %
HCT: 44.2 % (ref 35.0–45.0)
Hemoglobin: 14.5 g/dL (ref 11.7–15.5)
Lymphs Abs: 1555 cells/uL (ref 850–3900)
MCH: 29.2 pg (ref 27.0–33.0)
MCHC: 32.8 g/dL (ref 32.0–36.0)
MCV: 89.1 fL (ref 80.0–100.0)
MPV: 10.9 fL (ref 7.5–12.5)
Monocytes Relative: 9.2 %
Neutro Abs: 5213 cells/uL (ref 1500–7800)
Neutrophils Relative %: 67.7 %
Platelets: 225 10*3/uL (ref 140–400)
RBC: 4.96 10*6/uL (ref 3.80–5.10)
RDW: 13.3 % (ref 11.0–15.0)
Total Lymphocyte: 20.2 %
WBC: 7.7 10*3/uL (ref 3.8–10.8)

## 2020-10-01 NOTE — Addendum Note (Signed)
Addended by: Elmer Picker on: 10/01/2020 08:36 AM   Modules accepted: Orders

## 2020-10-02 ENCOUNTER — Ambulatory Visit: Payer: BC Managed Care – PPO | Admitting: Surgery

## 2020-10-02 LAB — URIC ACID

## 2020-10-02 LAB — ANA

## 2020-10-02 LAB — TIQ-MISC

## 2020-10-02 LAB — SEDIMENTATION RATE

## 2020-10-02 LAB — RHEUMATOID FACTOR: Rheumatoid fact SerPl-aCnc: <14 IU/mL (ref <14)

## 2020-10-03 ENCOUNTER — Telehealth: Payer: Self-pay

## 2020-10-03 ENCOUNTER — Other Ambulatory Visit: Payer: Self-pay | Admitting: Surgery

## 2020-10-03 DIAGNOSIS — M25532 Pain in left wrist: Secondary | ICD-10-CM

## 2020-10-03 NOTE — Telephone Encounter (Signed)
Called and advised patient that her lab order is ready to be picked up at the front desk.  Patient voiced that she understands and will pick up this afternoon.

## 2020-10-18 ENCOUNTER — Telehealth (INDEPENDENT_AMBULATORY_CARE_PROVIDER_SITE_OTHER): Payer: BC Managed Care – PPO | Admitting: Family Medicine

## 2020-10-18 ENCOUNTER — Encounter: Payer: Self-pay | Admitting: Family Medicine

## 2020-10-18 DIAGNOSIS — R509 Fever, unspecified: Secondary | ICD-10-CM | POA: Diagnosis not present

## 2020-10-18 DIAGNOSIS — J029 Acute pharyngitis, unspecified: Secondary | ICD-10-CM

## 2020-10-18 MED ORDER — AMOXICILLIN 500 MG PO TABS: 1000.0000 mg | ORAL_TABLET | Freq: Every day | ORAL | 0 refills | Status: AC

## 2020-10-18 NOTE — Progress Notes (Signed)
Virtual Visit via Video Note  I connected with Ann Lee on 10/18/20 at 11:00 AM EDT by a video enabled telemedicine application 2/2 [id number] pandemic and verified that I am speaking with the correct person using two identifiers.  Location patient: home Location provider:work or home office Persons participating in the virtual visit: patient, provider  I discussed the limitations of evaluation and management by telemedicine and the availability of in person appointments. The patient expressed understanding and agreed to proceed.   HPI: Pt is a 65 yo female seen for acute concern.  Pt with chills on Wed.  Then developed sore throat though noted as less sore but more difficulty with swallowing 2/2 feeling swollen, fever Tmax 102.57F, and ear pressure.  Head feels heavy/dull ache.  Pt has coughing if tries to clear her throat.  Denies n/v, diarrhea, loss of taste or smell, rhinorrhea. Drinking V8.  Tried tylenol.   Pt is the director of a day care center.   Patient endorses COVID testing negative x2.  Pt states she sees white spots on the back of her throat.   ROS: See pertinent positives and negatives per HPI.  Past Medical History:  Diagnosis Date  . HERPES ZOSTER 10/18/2008  . NECK PAIN 02/15/2009  . PARESTHESIA 10/30/2009  . PERS HX TOBACCO USE PRESENTING HAZARDS HEALTH 03/10/2010  . SINUSITIS, ACUTE 05/03/2009  . SORE THROAT 02/12/2009  . TMJ SYNDROME 02/11/2009    No past surgical history on file.  Family History  Problem Relation Age of Onset  . Cancer Mother        esophageal  . Arthritis Mother   . Hyperlipidemia Father   . Arthritis Father   . Cancer Father        colon     Current Outpatient Medications:  .  SUMAtriptan (IMITREX) 25 MG tablet, Take one at onset of migraine and may repeat one in two hours as needed (max of 2 in 24 hours), Disp: 10 tablet, Rfl: 11 .  traMADol (ULTRAM) 50 MG tablet, Take 1 tablet (50 mg total) by mouth every 6 (six) hours as needed.,  Disp: 30 tablet, Rfl: 0 .  methylPREDNISolone (MEDROL) 4 MG tablet, 6 day taper to be taken as directed (Patient not taking: Reported on 10/18/2020), Disp: 21 tablet, Rfl: 0  Current Facility-Administered Medications:  .  methylPREDNISolone acetate (DEPO-MEDROL) injection 40 mg, 40 mg, Intramuscular, Once, Benjiman Core M, PA-C  EXAM:  VITALS per patient if applicable:  RR between 12-20 bpm, temp 100.81F  GENERAL: alert, oriented, appears well and in no acute distress  HEENT: atraumatic, conjunctiva clear, no obvious abnormalities on inspection of external nose and ears.  Per pt white spots in back of throat, erythema, and edema when looking in mirror during visit.  NECK: normal movements of the head and neck  LUNGS: on inspection no signs of respiratory distress, breathing rate appears normal, no obvious gross SOB, gasping or wheezing  CV: no obvious cyanosis  MS: moves all visible extremities without noticeable abnormality  PSYCH/NEURO: pleasant and cooperative, no obvious depression or anxiety, speech and thought processing grossly intact  ASSESSMENT AND PLAN:  Discussed the following assessment and plan:  Pharyngitis, unspecified etiology - Plan: amoxicillin (AMOXIL) 500 MG tablet  Fever, unspecified fever cause   Discussed possible causes of symptoms including viral etiology such as acute nasopharyngitis, influenza, or COVID-19 virus despite 2 negative test.  Must also consider strep pharyngitis given appearance of pharynx per patient.  Discussed obtaining strep and flu  testing.  Patient advised to contact health at work or set up appointment at [facility].  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  Prescription for amoxicillin sent to pharmacy.  Continue treatment of symptoms-hydration, rest, Tylenol, gargling with warm salt water or Chloraseptic spray.  Given precautions.  Follow-up with PCP as needed   I discussed the  assessment and treatment plan with the patient. The patient was provided an opportunity to ask questions and all were answered. The patient agreed with the plan and demonstrated an understanding of the instructions.   The patient was advised to call back or seek an in-person evaluation if the symptoms worsen or if the condition fails to improve as anticipated.  Billie Ruddy, MD

## 2020-10-31 ENCOUNTER — Other Ambulatory Visit: Payer: Self-pay

## 2020-11-01 ENCOUNTER — Encounter: Payer: Self-pay | Admitting: Family Medicine

## 2020-11-01 ENCOUNTER — Ambulatory Visit (INDEPENDENT_AMBULATORY_CARE_PROVIDER_SITE_OTHER): Payer: BC Managed Care – PPO

## 2020-11-01 ENCOUNTER — Ambulatory Visit: Payer: BC Managed Care – PPO | Admitting: Family Medicine

## 2020-11-01 VITALS — BP 110/80 | HR 75 | Temp 97.6°F | Ht 62.5 in | Wt 131.6 lb

## 2020-11-01 DIAGNOSIS — M79644 Pain in right finger(s): Secondary | ICD-10-CM

## 2020-11-01 DIAGNOSIS — L309 Dermatitis, unspecified: Secondary | ICD-10-CM

## 2020-11-01 DIAGNOSIS — S6991XA Unspecified injury of right wrist, hand and finger(s), initial encounter: Secondary | ICD-10-CM | POA: Diagnosis not present

## 2020-11-01 MED ORDER — CLOTRIMAZOLE 1 % EX CREA: 1.0000 "application " | TOPICAL_CREAM | Freq: Two times a day (BID) | CUTANEOUS | 0 refills | Status: DC

## 2020-11-01 MED ORDER — TRIAMCINOLONE ACETONIDE 0.1 % EX CREA
1.0000 | TOPICAL_CREAM | Freq: Two times a day (BID) | CUTANEOUS | 0 refills | Status: DC
Start: 2020-11-01 — End: 2021-05-22

## 2020-11-01 NOTE — Progress Notes (Signed)
Ann Lee DOB: 1955/12/09 Encounter date: 11/01/2020  This is a 65 y.o. female who presents with Chief Complaint  Patient presents with  . Rash    Patient complains of red, blister-like rash noted from under the breasts x4-5 days  . Hand Pain    Patient complains of right thumb pain x2 days ago, states she caught her son who fell from the ceiling while fixing the air conditioner, alternating Tylenol and Advil for pain    History of present illness:  Does think in summer sometimes gets sweaty and little rash - but something that usually baby powder/calamine lotion helps with. States this is flourishing. Has tried calamine lotion, oatmeal, cool packs, benadryl spray. No help. Last night slept with cold wash clothes on breasts.   Son was coming down ladder - she caught him by hips; thumb extended; swollen. Doesn't hurt any more. Pretty much stuck in thumbs up position. Hurt worst in base of joint when she did it. First 2 days was taking a lot of advil for it.    Allergies  Allergen Reactions  . Iodine Hives and Swelling  . Shellfish Allergy Hives and Swelling   Current Meds  Medication Sig  . acetaminophen (TYLENOL) 500 MG tablet Take 500 mg by mouth as needed.  Marland Kitchen ibuprofen (ADVIL) 200 MG tablet Take 200 mg by mouth as needed.  . SUMAtriptan (IMITREX) 25 MG tablet Take one at onset of migraine and may repeat one in two hours as needed (max of 2 in 24 hours)   Current Facility-Administered Medications for the 11/01/20 encounter (Office Visit) with Caren Macadam, MD  Medication  . methylPREDNISolone acetate (DEPO-MEDROL) injection 40 mg    Review of Systems  Constitutional: Negative for chills, fatigue and fever.  Respiratory: Negative for cough, chest tightness, shortness of breath and wheezing.   Cardiovascular: Negative for chest pain, palpitations and leg swelling.  Musculoskeletal:       Thumb pain right  Skin: Positive for rash.    Objective:  BP 110/80 (BP  Location: Left Arm, Patient Position: Sitting, Cuff Size: Normal)   Pulse 75   Temp 97.6 F (36.4 C) (Oral)   Ht 5' 2.5" (1.588 m)   Wt 131 lb 9.6 oz (59.7 kg)   SpO2 97%   BMI 23.69 kg/m   Weight: 131 lb 9.6 oz (59.7 kg)   BP Readings from Last 3 Encounters:  11/01/20 110/80  09/26/20 118/72  08/05/20 122/80   Wt Readings from Last 3 Encounters:  11/01/20 131 lb 9.6 oz (59.7 kg)  09/26/20 135 lb (61.2 kg)  09/26/20 135 lb (61.2 kg)    Physical Exam Constitutional:      General: She is not in acute distress.    Appearance: She is well-developed.  Cardiovascular:     Rate and Rhythm: Normal rate and regular rhythm.     Heart sounds: Normal heart sounds. No murmur heard. No friction rub.  Pulmonary:     Effort: Pulmonary effort is normal. No respiratory distress.     Breath sounds: Normal breath sounds. No wheezing or rales.  Musculoskeletal:     Right lower leg: No edema.     Left lower leg: No edema.     Comments: Edema around mcp thumb right; into palm. Tender to palpation. Weak extension of thumb; strength seems intact with flexion. No specific joint tenderness noted, no wrist pain, no snuff box pain. echymosis thenar eminence slight.  Skin:    Comments: Erythematous papular rash  under breasts bilat - no confluency of rash at present; but patient states it flares.   Neurological:     Mental Status: She is alert and oriented to person, place, and time.  Psychiatric:        Behavior: Behavior normal.     Assessment/Plan  1. Dermatitis Clotrimazole; then triamcinolone as directed x 2 weeks. I will check in with her Monday once xray back. Due to location and isolated location of rash; suspect fungal but would consider alternative dx if not improving.   2. Thumb pain, right Pain has improved, but some weakness and edema remain. Consider specialty eval if not continuing to improve or if abnormal xray. - DG Finger Thumb Right; Future  Return for pending xray; update  on rash.      Micheline Rough, MD

## 2020-11-01 NOTE — Patient Instructions (Signed)
Use the clotrimazole cream to dry skin twice daily; triamcinolone can be applied on top to help with itching. If worsening of skin let me know; but I will check in with you on Monday.

## 2021-02-04 ENCOUNTER — Telehealth (INDEPENDENT_AMBULATORY_CARE_PROVIDER_SITE_OTHER): Payer: BC Managed Care – PPO | Admitting: Family Medicine

## 2021-02-04 ENCOUNTER — Other Ambulatory Visit: Payer: Self-pay

## 2021-02-04 DIAGNOSIS — U071 COVID-19: Secondary | ICD-10-CM | POA: Diagnosis not present

## 2021-02-04 MED ORDER — MOLNUPIRAVIR EUA 200MG CAPSULE: 4.0000 | ORAL_CAPSULE | Freq: Two times a day (BID) | ORAL | 0 refills | Status: AC

## 2021-02-04 NOTE — Progress Notes (Signed)
Patient ID: Ann Lee, female   DOB: June 02, 1956, 65 y.o.   MRN: [id number]  This visit type was conducted due to national recommendations for restrictions regarding the COVID-19 pandemic in an effort to limit this patient's exposure and mitigate transmission in our community.   Virtual Visit via Video Note  I connected with Ann Lee on 02/04/21 at  1:15 PM EDT by a video enabled telemedicine application and verified that I am speaking with the correct person using two identifiers.  Location patient: home Location provider:work or home office Persons participating in the virtual visit: patient, provider  I discussed the limitations of evaluation and management by telemedicine and the availability of in person appointments. The patient expressed understanding and agreed to proceed.   HPI:  Ann Lee presents for COVID-19 infection.  She first developed symptoms late Saturday which was 4 days ago.  She has had some high fever over the weekend.  She had other symptoms including sore throat, fatigue, chills, headache.  Some cough.  No nausea or vomiting.  Poor appetite.  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 She does have longstanding history of nicotine use.  No dyspnea at rest.   ROS: See pertinent positives and negatives per HPI.  Past Medical History:  Diagnosis Date   HERPES ZOSTER 10/18/2008   NECK PAIN 02/15/2009   PARESTHESIA 10/30/2009   PERS HX TOBACCO USE PRESENTING HAZARDS HEALTH 03/10/2010   SINUSITIS, ACUTE 05/03/2009   SORE THROAT 02/12/2009   TMJ SYNDROME 02/11/2009    No past surgical history on file.  Family History  Problem Relation Age of Onset   Cancer Mother        esophageal   Arthritis Mother    Hyperlipidemia Father    Arthritis Father    Cancer Father        colon    SOCIAL HX: Long history of smoking   Current Outpatient Medications:    acetaminophen (TYLENOL) 500 MG tablet, Take 500  mg by mouth as needed., Disp: , Rfl:    clotrimazole (CLOTRIMAZOLE ANTI-FUNGAL) 1 % cream, Apply 1 application topically 2 (two) times daily., Disp: 30 g, Rfl: 0   ibuprofen (ADVIL) 200 MG tablet, Take 200 mg by mouth as needed., Disp: , Rfl:    molnupiravir EUA 200 mg CAPS, Take 4 capsules (800 mg total) by mouth 2 (two) times daily for 5 days., Disp: 40 capsule, Rfl: 0   SUMAtriptan (IMITREX) 25 MG tablet, Take one at onset of migraine and may repeat one in two hours as needed (max of 2 in 24 hours), Disp: 10 tablet, Rfl: 11   triamcinolone cream (KENALOG) 0.1 %, Apply 1 application topically 2 (two) times daily., Disp: 30 g, Rfl: 0  Current Facility-Administered Medications:    methylPREDNISolone acetate (DEPO-MEDROL) injection 40 mg, 40 mg, Intramuscular, Once, Benjiman Core M, PA-C  EXAM:  VITALS per patient if applicable:  GENERAL: alert, oriented, appears well and in no acute distress  HEENT: atraumatic, conjunttiva clear, no obvious abnormalities on inspection of external nose and ears  NECK: normal movements of the head and neck  LUNGS: on inspection no signs of respiratory distress, breathing rate appears normal, no obvious gross SOB, gasping or wheezing  CV: no obvious cyanosis  MS: moves all visible extremities without noticeable abnormality  PSYCH/NEURO: pleasant and cooperative, no obvious depression or anxiety, speech and thought processing grossly intact  ASSESSMENT AND PLAN:  Discussed the following assessment  and plan:  COVID-19-patient has only some moderate symptoms but fortunately no dyspnea.  She is keeping down some fluids but appears to be drinking insufficient amount of fluid.  We recommend she increase her fluid intake.  We had a long discussion regarding antivirals.  She has not had any recent renal function.  She does not take any regular medications.  We recommend Molnupiravir 4 capsules twice daily for 5 days.  She does have some increased risk because of  her age and longstanding history of nicotine use and probable COPD     I discussed the assessment and treatment plan with the patient. The patient was provided an opportunity to ask questions and all were answered. The patient agreed with the plan and demonstrated an understanding of the instructions.   The patient was advised to call back or seek an in-person evaluation if the symptoms worsen or if the condition fails to improve as anticipated.     Carolann Littler, MD

## 2021-03-17 ENCOUNTER — Other Ambulatory Visit: Payer: Self-pay | Admitting: *Deleted

## 2021-03-17 DIAGNOSIS — F17210 Nicotine dependence, cigarettes, uncomplicated: Secondary | ICD-10-CM

## 2021-03-21 ENCOUNTER — Encounter: Payer: Self-pay | Admitting: Acute Care

## 2021-03-21 ENCOUNTER — Telehealth: Payer: Self-pay | Admitting: Acute Care

## 2021-03-31 NOTE — Telephone Encounter (Signed)
Error

## 2021-04-02 ENCOUNTER — Inpatient Hospital Stay: Admission: RE | Admit: 2021-04-02 | Payer: BC Managed Care – PPO | Source: Ambulatory Visit

## 2021-04-03 ENCOUNTER — Ambulatory Visit
Admission: RE | Admit: 2021-04-03 | Discharge: 2021-04-03 | Disposition: A | Discharge status: Home / Self Care | Payer: BC Managed Care – PPO | Source: Ambulatory Visit | Attending: Acute Care | Admitting: Acute Care

## 2021-04-03 DIAGNOSIS — F17210 Nicotine dependence, cigarettes, uncomplicated: Secondary | ICD-10-CM | POA: Diagnosis not present

## 2021-04-08 ENCOUNTER — Other Ambulatory Visit: Payer: Self-pay | Admitting: Acute Care

## 2021-04-08 DIAGNOSIS — F17210 Nicotine dependence, cigarettes, uncomplicated: Secondary | ICD-10-CM

## 2021-04-09 ENCOUNTER — Telehealth (INDEPENDENT_AMBULATORY_CARE_PROVIDER_SITE_OTHER): Payer: BC Managed Care – PPO | Admitting: Family Medicine

## 2021-04-09 ENCOUNTER — Other Ambulatory Visit: Payer: Self-pay

## 2021-04-09 DIAGNOSIS — J029 Acute pharyngitis, unspecified: Secondary | ICD-10-CM

## 2021-04-09 NOTE — Progress Notes (Signed)
Patient ID: Ann Lee, female   DOB: [birth year], 65 y.o.   MRN: [id number]  This visit type was conducted due to national recommendations for restrictions regarding the COVID-19 pandemic in an effort to limit this patient's exposure and mitigate transmission in our community.   Virtual Visit via Video Note  I connected with Ann Lee on 04/09/21 at  7:30 AM EDT by a video enabled telemedicine application and verified that I am speaking with the correct person using two identifiers.  Location patient: home Location provider:work or home office Persons participating in the virtual visit: patient, provider  I discussed the limitations of evaluation and management by telemedicine and the availability of in person appointments. The patient expressed understanding and agreed to proceed.   HPI:  Ann Lee relates onset of severe sore throat yesterday afternoon with some body aches.  Possible low-grade fever.  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  She denies any cough or nasal congestion.  She woke up this morning and states she felt totally better with no sore throat no body aches no fever.  No nausea or vomiting.  No skin rashes.  No significant headache.   ROS: See pertinent positives and negatives per HPI.  Past Medical History:  Diagnosis Date   HERPES ZOSTER 10/18/2008   NECK PAIN 02/15/2009   PARESTHESIA 10/30/2009   PERS HX TOBACCO USE PRESENTING HAZARDS HEALTH 03/10/2010   SINUSITIS, ACUTE 05/03/2009   SORE THROAT 02/12/2009   TMJ SYNDROME 02/11/2009    No past surgical history on file.  Family History  Problem Relation Age of Onset   Cancer Mother        esophageal   Arthritis Mother    Hyperlipidemia Father    Arthritis Father    Cancer Father        colon    SOCIAL HX: Long history of nicotine use   Current Outpatient Medications:    acetaminophen (TYLENOL) 500 MG tablet, Take 500 mg by mouth as needed.,  Disp: , Rfl:    clotrimazole (CLOTRIMAZOLE ANTI-FUNGAL) 1 % cream, Apply 1 application topically 2 (two) times daily., Disp: 30 g, Rfl: 0   ibuprofen (ADVIL) 200 MG tablet, Take 200 mg by mouth as needed., Disp: , Rfl:    SUMAtriptan (IMITREX) 25 MG tablet, Take one at onset of migraine and may repeat one in two hours as needed (max of 2 in 24 hours), Disp: 10 tablet, Rfl: 11   triamcinolone cream (KENALOG) 0.1 %, Apply 1 application topically 2 (two) times daily., Disp: 30 g, Rfl: 0  Current Facility-Administered Medications:    methylPREDNISolone acetate (DEPO-MEDROL) injection 40 mg, 40 mg, Intramuscular, Once, Benjiman Core M, PA-C  EXAM:  VITALS per patient if applicable:  GENERAL: alert, oriented, appears well and in no acute distress  HEENT: atraumatic, conjunttiva clear, no obvious abnormalities on inspection of external nose and ears  NECK: normal movements of the head and neck  LUNGS: on inspection no signs of respiratory distress, breathing rate appears normal, no obvious gross SOB, gasping or wheezing  CV: no obvious cyanosis  MS: moves all visible extremities without noticeable abnormality  PSYCH/NEURO: pleasant and cooperative, no obvious depression or anxiety, speech and thought processing grossly intact  ASSESSMENT AND PLAN:  Discussed the following assessment and plan:  Sore throat-patient has risk factors for strep with recent exposure and did have lack of cough and nasal congestion but she states her symptoms are 100%  improved today.  We recommend observation for now.  If she has any recurrent headache, body aches, fever, sore throat we will recommend empirically treating for possible strep She will take over-the-counter anti-inflammatory medications as needed at this time     I discussed the assessment and treatment plan with the patient. The patient was provided an opportunity to ask questions and all were answered. The patient agreed with the plan and  demonstrated an understanding of the instructions.   The patient was advised to call back or seek an in-person evaluation if the symptoms worsen or if the condition fails to improve as anticipated.     Carolann Littler, MD

## 2021-04-25 ENCOUNTER — Telehealth: Payer: Self-pay

## 2021-04-25 NOTE — Telephone Encounter (Signed)
Patient called into the office stating that she has been exposed to pink eye and she wants an antibiotic. Patient was asked to schedule an appointment and declined. She became very irritated, stating she would figure something out and ended the call.

## 2021-05-18 DIAGNOSIS — B349 Viral infection, unspecified: Secondary | ICD-10-CM | POA: Diagnosis not present

## 2021-05-18 DIAGNOSIS — J029 Acute pharyngitis, unspecified: Secondary | ICD-10-CM | POA: Diagnosis not present

## 2021-05-22 ENCOUNTER — Telehealth (INDEPENDENT_AMBULATORY_CARE_PROVIDER_SITE_OTHER): Payer: BC Managed Care – PPO | Admitting: Family Medicine

## 2021-05-22 ENCOUNTER — Encounter: Payer: Self-pay | Admitting: Family Medicine

## 2021-05-22 VITALS — BP 130/65 | Temp 99.2°F | Wt 129.0 lb

## 2021-05-22 DIAGNOSIS — R059 Cough, unspecified: Secondary | ICD-10-CM

## 2021-05-22 MED ORDER — ALBUTEROL SULFATE HFA 108 (90 BASE) MCG/ACT IN AERS: 2.0000 | INHALATION_SPRAY | Freq: Four times a day (QID) | RESPIRATORY_TRACT | 0 refills | Status: DC | PRN

## 2021-05-22 MED ORDER — BENZONATATE 100 MG PO CAPS: ORAL_CAPSULE | ORAL | 0 refills | Status: DC

## 2021-05-22 NOTE — Patient Instructions (Signed)
-  I sent the medication(s) we discussed to your pharmacy: Meds ordered this encounter  Medications   benzonatate (TESSALON PERLES) 100 MG capsule    Sig: 1-2 capsules up to twice daily for cough    Dispense:  30 capsule    Refill:  0   albuterol (PROAIR HFA) 108 (90 Base) MCG/ACT inhaler    Sig: Inhale 2 puffs into the lungs every 6 (six) hours as needed for wheezing or shortness of breath.    Dispense:  1 each    Refill:  0    I hope you are feeling better soon!  Seek in person care promptly if your symptoms worsen, new concerns arise or you are not improving with treatment.  It was nice to meet you today. I help [facility] out with telemedicine visits on Tuesdays and Thursdays and am happy to help if you need a virtual follow up visit on those days. Otherwise, if you have any concerns or questions following this visit please schedule a follow up visit with your Primary Care office or seek care at a local urgent care clinic to avoid delays in care

## 2021-05-22 NOTE — Progress Notes (Signed)
Virtual Visit via Video Note  I connected with Ann Lee  on 05/22/21 at 11:00 AM EST by a video enabled telemedicine application and verified that I am speaking with the correct person using two identifiers.  Location patient: home, [place] Location provider:work or home office Persons participating in the virtual visit: patient, provider  I discussed the limitations of evaluation and management by telemedicine and the availability of in person appointments. The patient expressed understanding and agreed to proceed.   HPI:  Acute telemedicine visit for flu like symptoms: -Onset: about 5 - 6 days ago -has covid and flu and strep testing at [facility] on Saturday -several home covid tests were negative -Symptoms include: fever the first few days, nasal congestion, cough -doing better but still with cough and some wheezing/tightness and nasal congestion -Denies: fevers today, CP, SOB, NVD, inability to eat/drink/get out bed -Has tried: theraflu -Pertinent past medical history: smoking history -Pertinent medication allergies:  Allergies  Allergen Reactions   Iodine Hives and Swelling   Shellfish Allergy Hives and Swelling  -COVID-19 vaccine status:  Immunization History  Administered Date(s) Administered   PFIZER(Purple Top)SARS-COV-2 Vaccination 06/29/2019, 07/17/2019   Pneumococcal Polysaccharide-23 07/02/2010   Td 09/13/2008    ROS: See pertinent positives and negatives per HPI.  Past Medical History:  Diagnosis Date   HERPES ZOSTER 10/18/2008   NECK PAIN 02/15/2009   PARESTHESIA 10/30/2009   PERS HX TOBACCO USE PRESENTING HAZARDS HEALTH 03/10/2010   SINUSITIS, ACUTE 05/03/2009   SORE THROAT 02/12/2009   TMJ SYNDROME 02/11/2009    History reviewed. No pertinent surgical history.   Current Outpatient Medications:    acetaminophen (TYLENOL) 500 MG tablet, Take 500 mg by mouth as needed., Disp: , Rfl:    albuterol (PROAIR HFA) 108 (90 Base) MCG/ACT inhaler, Inhale 2 puffs into  the lungs every 6 (six) hours as needed for wheezing or shortness of breath., Disp: 1 each, Rfl: 0   benzonatate (TESSALON PERLES) 100 MG capsule, 1-2 capsules up to twice daily for cough, Disp: 30 capsule, Rfl: 0   ibuprofen (ADVIL) 200 MG tablet, Take 200 mg by mouth as needed., Disp: , Rfl:    SUMAtriptan (IMITREX) 25 MG tablet, Take one at onset of migraine and may repeat one in two hours as needed (max of 2 in 24 hours), Disp: 10 tablet, Rfl: 11  Current Facility-Administered Medications:    methylPREDNISolone acetate (DEPO-MEDROL) injection 40 mg, 40 mg, Intramuscular, Once, Benjiman Core M, PA-C  EXAM:  VITALS per patient if applicable:  GENERAL: alert, oriented, appears well and in no acute distress  HEENT: atraumatic, conjunttiva clear, no obvious abnormalities on inspection of external nose and ears  NECK: normal movements of the head and neck  LUNGS: on inspection no signs of respiratory distress, breathing rate appears normal, no obvious gross SOB, gasping or wheezing  CV: no obvious cyanosis  MS: moves all visible extremities without noticeable abnormality  PSYCH/NEURO: pleasant and cooperative, no obvious depression or anxiety, speech and thought processing grossly intact  ASSESSMENT AND PLAN:  Discussed the following assessment and plan:  Cough, unspecified type  -we discussed possible serious and likely etiologies, options for evaluation and workup, limitations of telemedicine visit vs in person visit, treatment, treatment risks and precautions. Pt is agreeable to treatment via telemedicine at this moment. Likely had flu with false neg testing vs other viral illness vs other. Glad she is improving, but the cough is no fun. Opted to treat with Tessalon Rx and albuterol as  may have some bronchitis.  Work/School slipped offered:declined Advised to seek prompt in person care if worsening, new symptoms arise, or if is not improving with treatment. Discussed options for  inperson care if PCP office not available. Did let this patient know that I do telemedicine on Tuesdays and Thursdays for [facility].    I discussed the assessment and treatment plan with the patient. The patient was provided an opportunity to ask questions and all were answered. The patient agreed with the plan and demonstrated an understanding of the instructions.     Lucretia Kern, DO

## 2021-06-13 ENCOUNTER — Other Ambulatory Visit: Payer: Self-pay | Admitting: Family Medicine

## 2021-07-12 ENCOUNTER — Other Ambulatory Visit: Payer: Self-pay | Admitting: Family Medicine

## 2021-08-26 ENCOUNTER — Encounter: Payer: BC Managed Care – PPO | Admitting: Family Medicine

## 2021-09-16 ENCOUNTER — Encounter: Payer: BC Managed Care – PPO | Admitting: Family Medicine

## 2021-10-29 ENCOUNTER — Encounter: Payer: Self-pay | Admitting: Internal Medicine

## 2021-10-29 ENCOUNTER — Telehealth (INDEPENDENT_AMBULATORY_CARE_PROVIDER_SITE_OTHER): Payer: BC Managed Care – PPO | Admitting: Internal Medicine

## 2021-10-29 VITALS — Wt 132.0 lb

## 2021-10-29 DIAGNOSIS — Z6379 Other stressful life events affecting family and household: Secondary | ICD-10-CM

## 2021-10-29 DIAGNOSIS — J0191 Acute recurrent sinusitis, unspecified: Secondary | ICD-10-CM | POA: Diagnosis not present

## 2021-10-29 DIAGNOSIS — J069 Acute upper respiratory infection, unspecified: Secondary | ICD-10-CM | POA: Diagnosis not present

## 2021-10-29 MED ORDER — AMOXICILLIN-POT CLAVULANATE 875-125 MG PO TABS: 1.0000 | ORAL_TABLET | Freq: Two times a day (BID) | ORAL | 0 refills | Status: DC

## 2021-10-29 NOTE — Progress Notes (Signed)
?Virtual Visit via Video Note ? ?I connected with Ann Lee on 10/29/21 at 12:30 PM EDT by a video enabled telemedicine application and verified that I am speaking with the correct person using two identifiers. ?Location patient: [facility] with [facility]  ?Location provider:work office ?Persons participating in the virtual visit: patient, provider ? ?[place] national recommendations  regarding COVID 19 pandemic   video visit is advised over in office visit for this patient.  ?Patient aware  of the limitations of evaluation and management by telemedicine and  availability of in person appointments. and agreed to proceed. ? ? ?HPI: ?Ann Lee presents for video visit, for ongoing respiratory symptoms.  ?Onset last week of st nasal drainage and not responding to  otc  no fever neg covid testing  but now  thick yellow green mucous drainage ocass blood. Facial pressure and tenderness .  On going recently   may get sinus problem a few times a year .   ?No Sob dyspnea  ? ?Grandson  in ICU with brain injury .  ? ?ROS: See pertinent positives and negatives per HPI. ? ?Past Medical History:  ?Diagnosis Date  ? HERPES ZOSTER 10/18/2008  ? NECK PAIN 02/15/2009  ? PARESTHESIA 10/30/2009  ? [place] HAZARDS HEALTH 03/10/2010  ? SINUSITIS, ACUTE 05/03/2009  ? SORE THROAT 02/12/2009  ? TMJ SYNDROME 02/11/2009  ? ? ?No past surgical history on file. ? ?Family History  ?Problem Relation Age of Onset  ? Cancer Mother   ?     esophageal  ? Arthritis Mother   ? Hyperlipidemia Father   ? Arthritis Father   ? Cancer Father   ?     colon  ? ? ?Social History  ? ?Tobacco Use  ? Smoking status: Every Day  ?  Packs/day: 1.00  ?  Years: 41.00  ?  Pack years: 41.00  ?  Types: Cigarettes  ? Smokeless tobacco: Never  ? Tobacco comments:  ?  She and sister are going to work on quitting together  ?Vaping Use  ? Vaping Use: Never used  ?Substance Use Topics  ? Alcohol use: No  ?  Alcohol/week: 0.0 standard drinks  ? Drug  use: No  ? ? ? ? ?Current Outpatient Medications:  ?  acetaminophen (TYLENOL) 500 MG tablet, Take 500 mg by mouth as needed., Disp: , Rfl:  ?  albuterol (VENTOLIN HFA) 108 (90 Base) MCG/ACT inhaler, TAKE 2 PUFFS BY MOUTH EVERY 6 HOURS AS NEEDED FOR WHEEZE OR SHORTNESS OF BREATH, Disp: 6.7 each, Rfl: 0 ?  amoxicillin-clavulanate (AUGMENTIN) 875-125 MG tablet, Take 1 tablet by mouth 2 (two) times daily. About every 12 hours for sinusitis, Disp: 14 tablet, Rfl: 0 ?  benzonatate (TESSALON PERLES) 100 MG capsule, 1-2 capsules up to twice daily for cough, Disp: 30 capsule, Rfl: 0 ?  ibuprofen (ADVIL) 200 MG tablet, Take 200 mg by mouth as needed., Disp: , Rfl:  ?  SUMAtriptan (IMITREX) 25 MG tablet, TAKE ONE AT ONSET OF MIGRAINE AND MAY REPEAT ONE IN TWO HOURS AS NEEDED (MAX OF 2 IN 24 HOURS), Disp: 10 tablet, Rfl: 11 ? ?Current Facility-Administered Medications:  ?  methylPREDNISolone acetate (DEPO-MEDROL) injection 40 mg, 40 mg, Intramuscular, Once, Lanae Crumbly, PA-C ? ?EXAM: ?BP Readings from Last 3 Encounters:  ?05/22/21 130/65  ?11/01/20 110/80  ?09/26/20 118/72  ? ? ?VITALS per patient if applicable: ? ?GENERAL: alert, oriented, appears congested  no edema  ? ?HEENT: atraumatic, conjunttiva  clear, no obvious abnormalities on inspection of external nose and ears ? ?NECK: normal movements of the head and neck ? ?LUNGS: on inspection no signs of respiratory distress, breathing rate appears normal, no obvious gross SOB, gasping or wheezing ? ?CV: no obvious cyanosis ? ?MS: moves all visible extremities without noticeable abnormality ? ?PSYCH/NEURO:, speech and thought processing grossly intact ? ? ?ASSESSMENT AND PLAN: ? ?Discussed the following assessment and plan: ? ?  ICD-10-CM   ?1. Acute recurrent sinusitis, unspecified location  J01.91   ?  ?2. URI, acute  J06.9   ?  ?3. Stress due to illness of family member  Z63.79   ?  ?Hx of same   sinus hygiene  add  augmentin    if get se  or not improved in 48-72 hours   contact med team for advise consider med change  . She asks about  z pack but advised not advised as first line  at this time in her situation  .  ?Counseled.  ? Expectant management and discussion of plan and treatment with opportunity to ask questions and all were answered. The patient agreed with the plan and demonstrated an understanding of the instructions. ?  ?Advised to call back or seek an in-person evaluation if worsening  or having  further concerns  in interim. ?Return if symptoms worsen or fail to improve as expected. ? ? ? ?Shanon Ace, MD  ?

## 2021-12-08 ENCOUNTER — Ambulatory Visit (INDEPENDENT_AMBULATORY_CARE_PROVIDER_SITE_OTHER): Payer: BC Managed Care – PPO | Admitting: Family Medicine

## 2021-12-08 ENCOUNTER — Encounter: Payer: Self-pay | Admitting: Family Medicine

## 2021-12-08 VITALS — BP 126/80 | HR 65 | Temp 98.1°F | Ht 64.17 in | Wt 131.6 lb

## 2021-12-08 DIAGNOSIS — F5102 Adjustment insomnia: Secondary | ICD-10-CM | POA: Diagnosis not present

## 2021-12-08 DIAGNOSIS — L84 Corns and callosities: Secondary | ICD-10-CM

## 2021-12-08 DIAGNOSIS — L821 Other seborrheic keratosis: Secondary | ICD-10-CM | POA: Diagnosis not present

## 2021-12-08 DIAGNOSIS — R21 Rash and other nonspecific skin eruption: Secondary | ICD-10-CM

## 2021-12-08 DIAGNOSIS — F4321 Adjustment disorder with depressed mood: Secondary | ICD-10-CM

## 2021-12-08 MED ORDER — TRAZODONE HCL 50 MG PO TABS
25.0000 mg | ORAL_TABLET | Freq: Every evening | ORAL | 3 refills | Status: DC | PRN
Start: 2021-12-08 — End: 2022-03-05

## 2021-12-08 MED ORDER — TRIAMCINOLONE ACETONIDE 0.1 % EX CREA: 1.0000 | TOPICAL_CREAM | Freq: Two times a day (BID) | CUTANEOUS | 0 refills | Status: DC

## 2021-12-17 IMAGING — CT CT CHEST LUNG CANCER SCREENING LOW DOSE W/O CM
2 of 5 series · 15 of 40 positions shown, 18 images · non-contrast
Comparison: Low-dose lung cancer screening CT chest dated
02/14/2019

CLINICAL DATA: 63-year-old female current smoker, with 44 pack-year
history of smoking, for follow-up lung cancer screening

EXAM:
CT CHEST WITHOUT CONTRAST LOW-DOSE FOR LUNG CANCER SCREENING
TECHNIQUE: Multidetector CT imaging of the chest was performed following the
standard protocol without IV contrast.

[Series 4: lung 1.00 br44 cor · coronal · 0.63mm/px · 3 of 256 slices shown]
[im 52/256  lung]
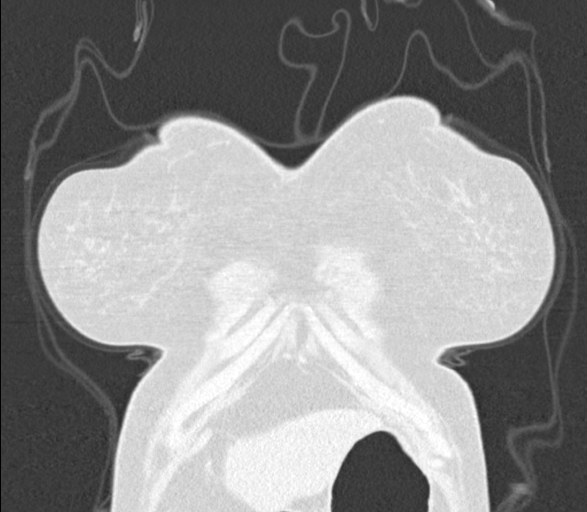
[im 103/256  lung]
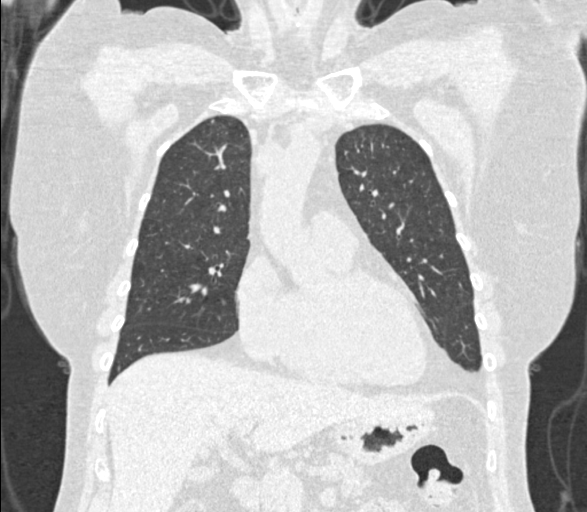
[im 154/256  lung]
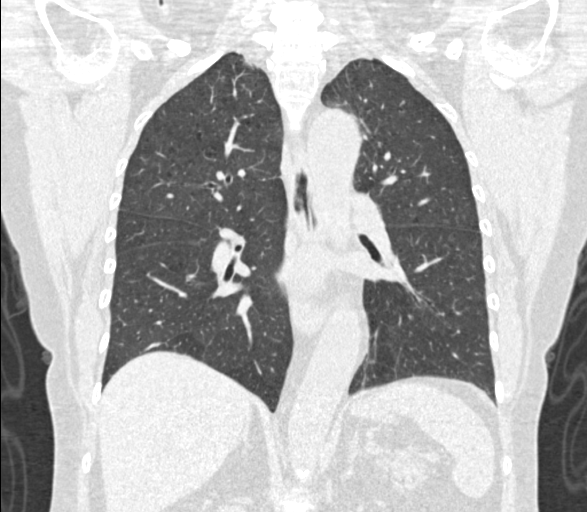

[Series 9: lung 1.00 br60 axial · axial · 0.73mm/px · z∈[-1121,-829]mm · 12 of 324 slices shown, 15 images]
[im 16/324  mediastinal]
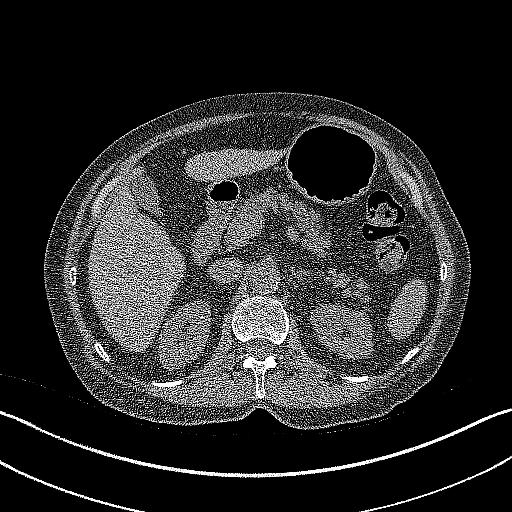
[im 16/324  lung]
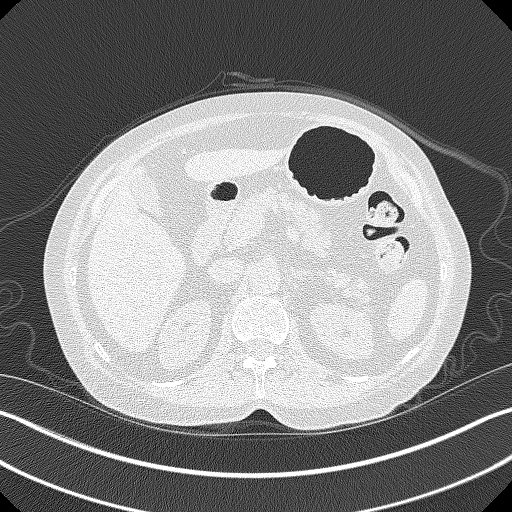
[im 47/324  lung]
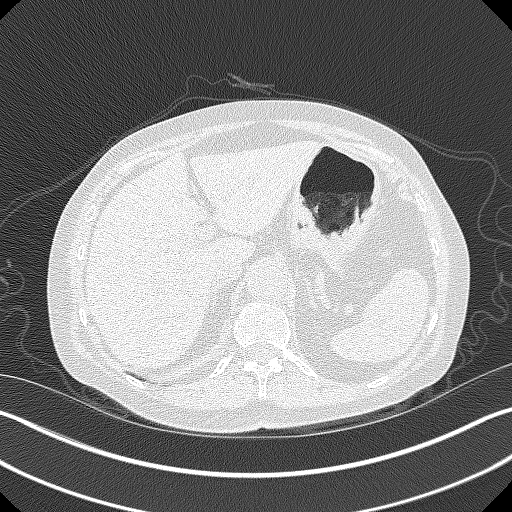
[im 77/324  lung]
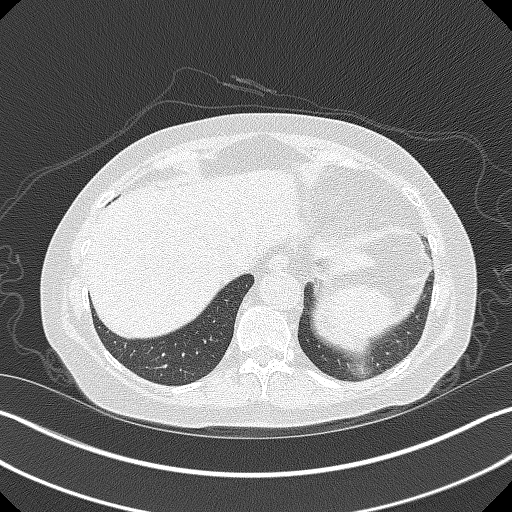
[im 93/324  lung]
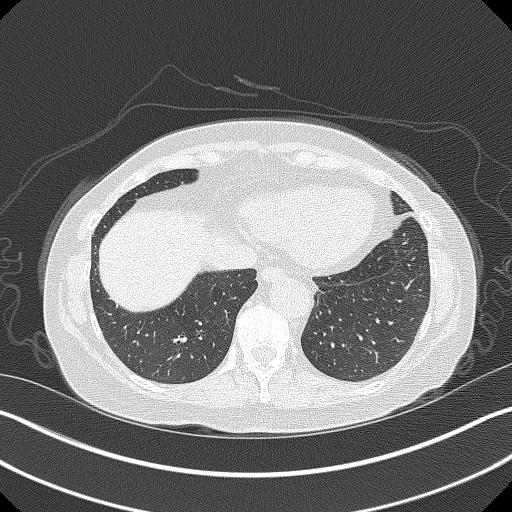
[im 124/324  mediastinal]
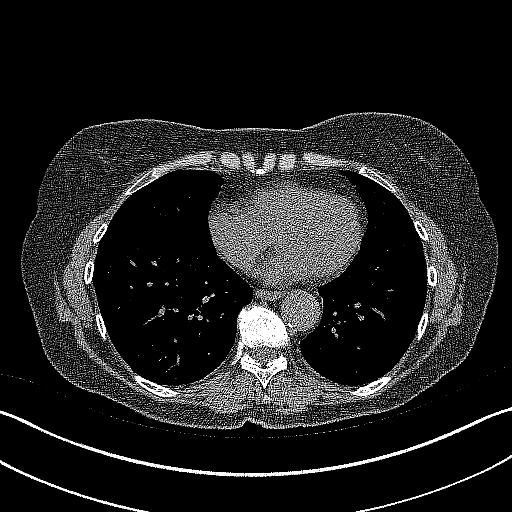
[im 124/324  lung]
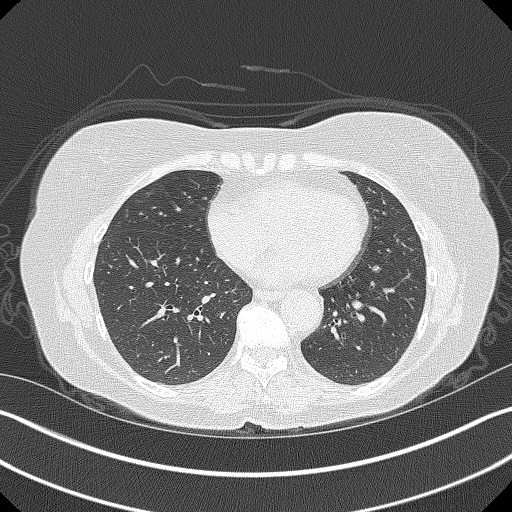
[im 154/324  lung]
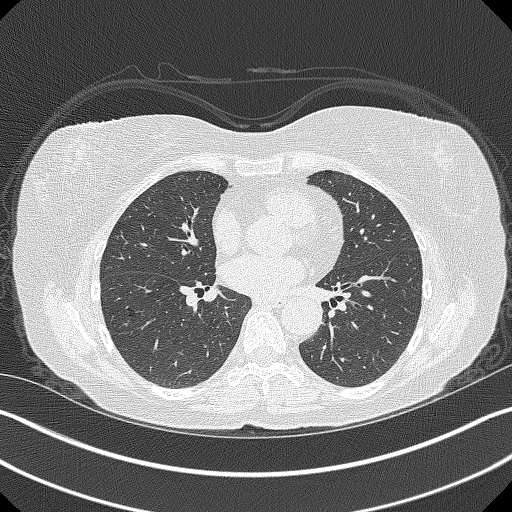
[im 170/324  lung]
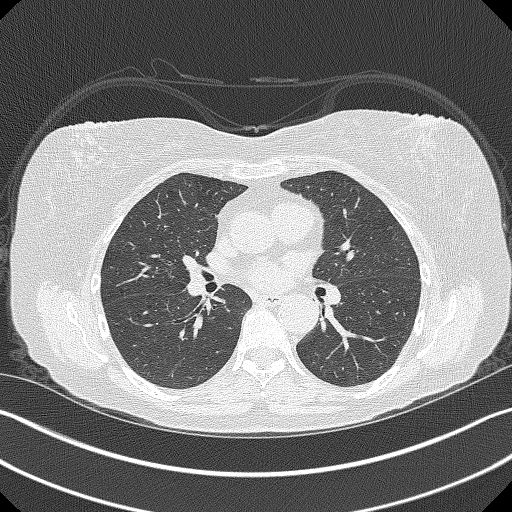
[im 200/324  lung]
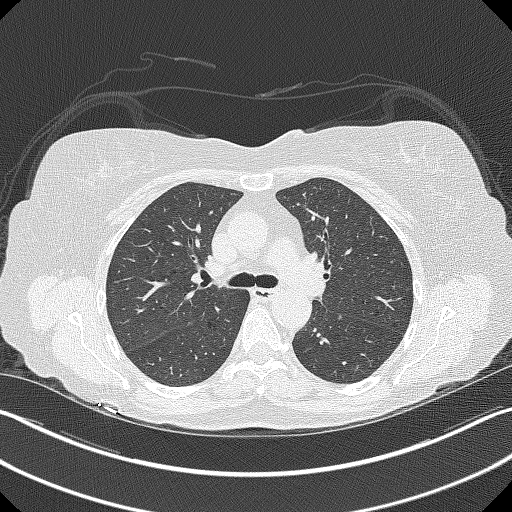
[im 231/324  mediastinal]
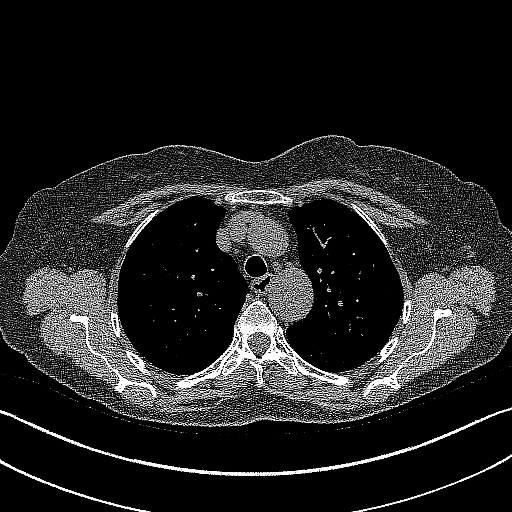
[im 231/324  lung]
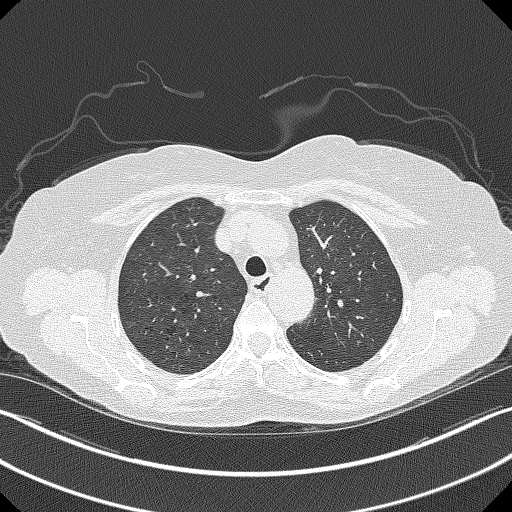
[im 247/324  lung]
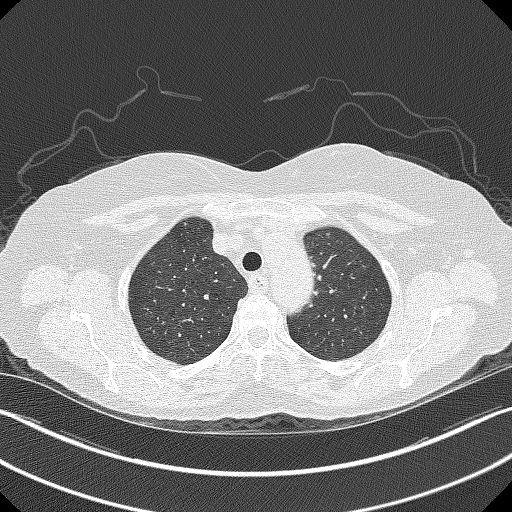
[im 277/324  lung]
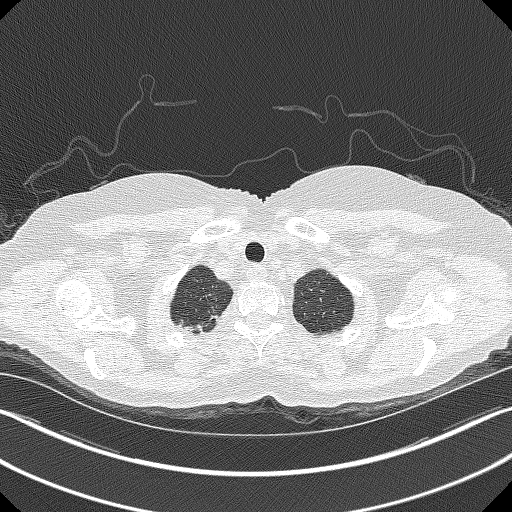
[im 308/324  lung]
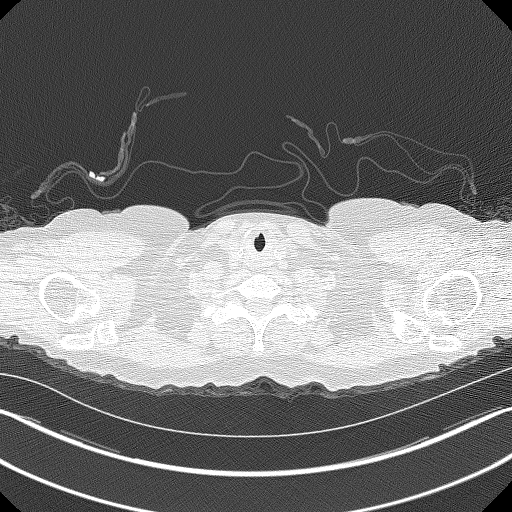

[15 of 40 positions shown; findings below may reference images not displayed]

FINDINGS: Cardiovascular: The heart is normal in size. No pericardial
effusion.

No evidence of thoracic aortic aneurysm. Mild atherosclerotic
calcifications of the descending thoracic aorta.

Mediastinum/Nodes: No suspicious mediastinal lymphadenopathy.

Visualized thyroid is unremarkable.

Lungs/Pleura: Mild biapical pleural-parenchymal scarring.

Mild centrilobular emphysematous changes, upper lung predominant.

No focal consolidation.

Scattered small bilateral pulmonary nodules, measuring up to 2.2 mm.

No pleural effusion or pneumothorax.

Upper Abdomen: Visualized upper abdomen is grossly unremarkable,
noting vascular calcifications.

Musculoskeletal: Visualized osseous structures are within normal
limits.
IMPRESSION: Lung-RADS 2, benign appearance or behavior. Continue annual
screening with low-dose chest CT without contrast in 12 months.

Aortic Atherosclerosis (8SVLW-2JZ.Z) and Emphysema (8SVLW-JK5.D).

## 2021-12-30 ENCOUNTER — Other Ambulatory Visit: Payer: Self-pay | Admitting: Family Medicine

## 2022-02-02 ENCOUNTER — Other Ambulatory Visit: Payer: Self-pay | Admitting: Family Medicine

## 2022-03-02 ENCOUNTER — Ambulatory Visit: Payer: BC Managed Care – PPO | Admitting: Family Medicine

## 2022-03-02 ENCOUNTER — Encounter: Payer: Self-pay | Admitting: Family Medicine

## 2022-03-02 VITALS — BP 126/70 | HR 65 | Temp 98.2°F | Ht 64.17 in | Wt 131.2 lb

## 2022-03-02 DIAGNOSIS — M545 Low back pain, unspecified: Secondary | ICD-10-CM | POA: Diagnosis not present

## 2022-03-02 LAB — SEDIMENTATION RATE: Sed Rate: 27 mm/hr (ref 0–30)

## 2022-03-02 LAB — CBC WITH DIFFERENTIAL/PLATELET
Basophils Absolute: 0 10*3/uL (ref 0.0–0.1)
Basophils Relative: 0.6 % (ref 0.0–3.0)
Eosinophils Absolute: 0.2 10*3/uL (ref 0.0–0.7)
Eosinophils Relative: 2.6 % (ref 0.0–5.0)
HCT: 42.6 % (ref 36.0–46.0)
Hemoglobin: 14.1 g/dL (ref 12.0–15.0)
Lymphocytes Relative: 27.1 % (ref 12.0–46.0)
Lymphs Abs: 2 10*3/uL (ref 0.7–4.0)
MCHC: 33.1 g/dL (ref 30.0–36.0)
MCV: 89.2 fl (ref 78.0–100.0)
Monocytes Absolute: 0.6 10*3/uL (ref 0.1–1.0)
Monocytes Relative: 8.5 % (ref 3.0–12.0)
Neutro Abs: 4.4 10*3/uL (ref 1.4–7.7)
Neutrophils Relative %: 61.2 % (ref 43.0–77.0)
Platelets: 211 10*3/uL (ref 150.0–400.0)
RBC: 4.78 Mil/uL (ref 3.87–5.11)
RDW: 14 % (ref 11.5–15.5)
WBC: 7.2 10*3/uL (ref 4.0–10.5)

## 2022-03-02 NOTE — Progress Notes (Signed)
Established Patient Office Visit  Subjective   Patient ID: Ann Lee, female    DOB: [birth year]  Age: 66 y.o. MRN: [id number]  Chief Complaint  Patient presents with   Back Pain    Patient complains of lower back pain, x3 weeks, Patient denies any known injury, Tried Tylenol with little relief     HPI   Ann Lee is seen with low back pain for about 3 weeks.  Denies any injury.  Denies any prior history of significant back difficulty.  She has achy pain which is bilateral lumbar.  Currently about 2 out of 10 and at its worst about 4 out of 10.  No radiculitis symptoms.  Tried Advil and Tylenol with minimal relief.  No pain with flexion.  Worse with standing.  No urinary symptoms.  No fevers or chills.  No appetite or weight changes.  Denies any lower extremity numbness or weakness.  No prior history of cancer.  She has had very stressful summer with emotional stressors of her mom dying and also she had a grandson that died of motor vehicle accident complications from head trauma early in the summer.  This has naturally been very draining for her.  Past Medical History:  Diagnosis Date   HERPES ZOSTER 10/18/2008   NECK PAIN 02/15/2009   PARESTHESIA 10/30/2009   PERS HX TOBACCO USE PRESENTING HAZARDS HEALTH 03/10/2010   SINUSITIS, ACUTE 05/03/2009   SORE THROAT 02/12/2009   TMJ SYNDROME 02/11/2009   History reviewed. No pertinent surgical history.  reports that she has been smoking cigarettes. She has a 41.00 pack-year smoking history. She has never used smokeless tobacco. She reports that she does not drink alcohol and does not use drugs. family history includes Arthritis in her father and mother; Cancer in her father and mother; Hyperlipidemia in her father. Allergies  Allergen Reactions   Iodine Hives and Swelling   Shellfish Allergy Hives and Swelling    Review of Systems  Constitutional:  Negative for chills, fever and weight loss.  Genitourinary:  Negative for dysuria.   Musculoskeletal:  Positive for back pain.  Neurological:  Negative for tingling and weakness.      Objective:     BP 126/70 (BP Location: Left Arm, Patient Position: Sitting, Cuff Size: Normal)   Pulse 65   Temp 98.2 F (36.8 C) (Oral)   Ht 5' 4.17" (1.63 m)   Wt 131 lb 3.2 oz (59.5 kg)   SpO2 98%   BMI 22.40 kg/m  BP Readings from Last 3 Encounters:  03/02/22 126/70  12/08/21 126/80  05/22/21 130/65   Wt Readings from Last 3 Encounters:  03/02/22 131 lb 3.2 oz (59.5 kg)  12/08/21 131 lb 9.6 oz (59.7 kg)  10/29/21 132 lb (59.9 kg)      Physical Exam Vitals reviewed.  Constitutional:      Appearance: Normal appearance.  Cardiovascular:     Rate and Rhythm: Normal rate and regular rhythm.  Pulmonary:     Effort: Pulmonary effort is normal.     Breath sounds: Normal breath sounds.  Musculoskeletal:     Comments: No spinal tenderness.  She has good range of motion with flexion of the back.  Straight leg raise are negative bilaterally.  Neurological:     Mental Status: She is alert.     Comments: Full strength lower extremities.  She has 2+ reflexes knee and ankle bilaterally.      No results found for any visits on 03/02/22.  The ASCVD Risk score (Arnett DK, et al., 2019) failed to calculate for the following reasons:   Cannot find a previous HDL lab   Cannot find a previous total cholesterol lab    Assessment & Plan:   Problem List Items Addressed This Visit   None Visit Diagnoses     Acute bilateral low back pain without sciatica    -  Primary   Relevant Orders   CBC with Differential/Platelet   CMP   Sedimentation rate     -Suspect musculoskeletal back pain.  No history of injury.  Patient would like to get some labs for reassurance.  Obtain labs as above.  Recommend some extension stretches.  If no better in a couple weeks consider physical therapy trial.  No follow-ups on file.    Carolann Littler, MD

## 2022-03-03 LAB — COMPREHENSIVE METABOLIC PANEL
ALT: 7 U/L (ref 0–35)
AST: 12 U/L (ref 0–37)
Albumin: 3.9 g/dL (ref 3.5–5.2)
Alkaline Phosphatase: 80 U/L (ref 39–117)
BUN: 11 mg/dL (ref 6–23)
CO2: 28 mEq/L (ref 19–32)
Calcium: 9.5 mg/dL (ref 8.4–10.5)
Chloride: 104 mEq/L (ref 96–112)
Creatinine, Ser: 0.87 mg/dL (ref 0.40–1.20)
GFR: 70.25 mL/min (ref >60.00)
Glucose, Bld: 76 mg/dL (ref 70–99)
Potassium: 3.9 mEq/L (ref 3.5–5.1)
Sodium: 139 mEq/L (ref 135–145)
Total Bilirubin: 0.3 mg/dL (ref 0.2–1.2)
Total Protein: 6.9 g/dL (ref 6.0–8.3)

## 2022-03-04 ENCOUNTER — Other Ambulatory Visit: Payer: Self-pay | Admitting: Family Medicine

## 2022-03-06 ENCOUNTER — Ambulatory Visit: Payer: BC Managed Care – PPO | Admitting: Family Medicine

## 2022-03-06 ENCOUNTER — Encounter: Payer: Self-pay | Admitting: Family Medicine

## 2022-03-06 VITALS — BP 124/80 | HR 70 | Temp 97.9°F | Ht 64.17 in | Wt 131.0 lb

## 2022-03-06 DIAGNOSIS — Z1211 Encounter for screening for malignant neoplasm of colon: Secondary | ICD-10-CM | POA: Diagnosis not present

## 2022-03-06 MED ORDER — MELOXICAM 15 MG PO TABS: 15.0000 mg | ORAL_TABLET | Freq: Every day | ORAL | 1 refills | Status: DC

## 2022-03-06 NOTE — Progress Notes (Signed)
Established Patient Office Visit  Subjective   Patient ID: Ann Lee, female    DOB: 30-Aug-1955  Age: 66 y.o. MRN: [id number]  Chief Complaint  Patient presents with   Back Pain    Patient complains of low back pain, x2 weeks, Patient denies any known injury     HPI    Patient here for follow-up low back pain.  Refer to prior note for details:  [name] is seen with low back pain for about 3 weeks.  Denies any injury.  Denies any prior history of significant back difficulty.  She has achy pain which is bilateral lumbar.  Currently about 2 out of 10 and at its worst about 4 out of 10.  No radiculitis symptoms.  Tried Advil and Tylenol with minimal relief.  No pain with flexion.  Worse with standing.  No urinary symptoms.  No fevers or chills.  No appetite or weight changes.  Denies any lower extremity numbness or weakness.   No prior history of cancer.  She has had very stressful summer with emotional stressors of her mom dying and also she had a grandson that died of motor vehicle accident complications from head trauma early in the summer.  This has naturally been very draining for her.  We obtained some labs last visit including CBC, CMP, sed rate and these were all basically reassuring.  Her pain is worse with prolonged standing in 1 position.  Improved somewhat with walking.  No radiation.  No weakness.  No numbness.  No fevers or chills.  No recent appetite or weight changes.  She has generally been reluctant to take medications and especially anything that can be sedating.  Past Medical History:  Diagnosis Date   HERPES ZOSTER 10/18/2008   NECK PAIN 02/15/2009   PARESTHESIA 10/30/2009   PERS HX TOBACCO USE PRESENTING HAZARDS HEALTH 03/10/2010   SINUSITIS, ACUTE 05/03/2009   SORE THROAT 02/12/2009   TMJ SYNDROME 02/11/2009   History reviewed. No pertinent surgical history.  reports that she has been smoking cigarettes. She has a 41.00 pack-year smoking history. She has never  used smokeless tobacco. She reports that she does not drink alcohol and does not use drugs. family history includes Arthritis in her father and mother; Cancer in her father and mother; Hyperlipidemia in her father. Allergies  Allergen Reactions   Iodine Hives and Swelling   Shellfish Allergy Hives and Swelling    Review of Systems  Constitutional:  Negative for chills, fever and weight loss.  Respiratory:  Negative for shortness of breath.   Gastrointestinal:  Negative for abdominal pain, nausea and vomiting.  Genitourinary:  Negative for dysuria.  Musculoskeletal:  Positive for back pain.      Objective:     BP 124/80 (BP Location: Left Arm, Cuff Size: Normal)   Pulse 70   Temp 97.9 F (36.6 C) (Oral)   Ht 5' 4.17" (1.63 m)   Wt 131 lb (59.4 kg)   SpO2 99%   BMI 22.37 kg/m    Physical Exam Vitals reviewed.  Cardiovascular:     Rate and Rhythm: Normal rate and regular rhythm.  Pulmonary:     Effort: Pulmonary effort is normal.     Breath sounds: Normal breath sounds.  Musculoskeletal:     Comments: straight leg raises are negative bilaterally.  Neurological:     Mental Status: She is alert.     Comments: Full strength lower extremities.  Reflexes remain 2+ knee and ankle bilaterally.  No results found for any visits on 03/06/22.    The ASCVD Risk score (Arnett DK, et al., 2019) failed to calculate for the following reasons:   Cannot find a previous HDL lab   Cannot find a previous total cholesterol lab    Assessment & Plan:   #1 low back pain.  Suspect musculoskeletal.  She is not any red flags by history or exam at this time.  Denies any injury.  Duration about 3 weeks. -Trial meloxicam 15 mg once daily.  Take with food.  Cautioned about potential side effects especially be careful for any GI upset -Reiterated importance of extension stretches and walking as tolerated -Consider plain films if not improved in 2 to 3 weeks -Consider trial of physical  therapy if not improved in a couple weeks.  Patient requesting Cologuard.  She has never had colonoscopy.  She is willing to consider colonoscopy if Cologuard positive.  This will be ordered   No follow-ups on file.    Carolann Littler, MD

## 2022-04-03 ENCOUNTER — Ambulatory Visit
Admission: RE | Admit: 2022-04-03 | Discharge: 2022-04-03 | Disposition: A | Discharge status: Home / Self Care | Payer: BC Managed Care – PPO | Source: Ambulatory Visit | Attending: Acute Care | Admitting: Acute Care

## 2022-04-03 DIAGNOSIS — F17210 Nicotine dependence, cigarettes, uncomplicated: Secondary | ICD-10-CM | POA: Diagnosis not present

## 2022-04-06 ENCOUNTER — Other Ambulatory Visit: Payer: Self-pay | Admitting: Family Medicine

## 2022-04-06 ENCOUNTER — Other Ambulatory Visit: Payer: Self-pay | Admitting: Acute Care

## 2022-04-06 DIAGNOSIS — F17210 Nicotine dependence, cigarettes, uncomplicated: Secondary | ICD-10-CM

## 2022-04-06 DIAGNOSIS — Z122 Encounter for screening for malignant neoplasm of respiratory organs: Secondary | ICD-10-CM

## 2022-04-06 DIAGNOSIS — Z87891 Personal history of nicotine dependence: Secondary | ICD-10-CM

## 2022-04-06 NOTE — Telephone Encounter (Signed)
Last office visit 03/07/22 Last refill 03/05/22

## 2022-05-04 ENCOUNTER — Telehealth: Payer: Self-pay | Admitting: *Deleted

## 2022-05-04 NOTE — Telephone Encounter (Signed)
PT called in because she had not received results from lung screening CT done 04/03/2022. I advised pt that we had mailed her a result letter to her home address. Pt stated that she had not received the letter. I apologized to pt and let her know that her CT showed stable lung nodules that were small in size and stable in consistency. Moderate emphysema was seen related to smoking history. Coronary calcifications seen. PT states that she is not currently on any Cholesterol medication. Pt advised that we sent a copy of her CT to Dr Elease Hashimoto and we will contact her closer to 12 mth to schedule her next CT. Pt verbalized understanding and had no further questions.

## 2022-05-05 ENCOUNTER — Other Ambulatory Visit: Payer: Self-pay | Admitting: Family Medicine

## 2022-05-05 DIAGNOSIS — Z1211 Encounter for screening for malignant neoplasm of colon: Secondary | ICD-10-CM | POA: Diagnosis not present

## 2022-05-09 ENCOUNTER — Other Ambulatory Visit: Payer: Self-pay | Admitting: Family Medicine

## 2022-05-15 LAB — COLOGUARD: COLOGUARD: NEGATIVE

## 2022-05-18 ENCOUNTER — Ambulatory Visit (INDEPENDENT_AMBULATORY_CARE_PROVIDER_SITE_OTHER): Payer: Medicare Other | Admitting: Family Medicine

## 2022-05-18 ENCOUNTER — Encounter: Payer: Self-pay | Admitting: Family Medicine

## 2022-05-18 VITALS — BP 120/70 | HR 73 | Temp 98.1°F | Ht 64.17 in | Wt 132.1 lb

## 2022-05-18 DIAGNOSIS — R1011 Right upper quadrant pain: Secondary | ICD-10-CM | POA: Diagnosis not present

## 2022-05-18 NOTE — Progress Notes (Signed)
Established Patient Office Visit  Subjective   Patient ID: Ann Lee, female    DOB: 07/31/1955  Age: 66 y.o. MRN: [id number]  Chief Complaint  Patient presents with   Abdominal Pain    Patient complains of right sided abdominal pain, x3 days,    HPI   Ann Lee is seen with episode of severe right upper quadrant abdominal pain this past Saturday morning around 4 AM.  She recalls eating pizza Friday night and felt fine then.  She woke up at 4 AM with severe sharp pain right upper quadrant with some radiation toward the back.  In fact, her pain was severe enough that at one point she thinks she may have passed out briefly.  She had some nausea without vomiting.  She thinks she may have had some fever.  She felt chilled.  No chest pain.  No pleuritic pain.  No cough.  No recent stool changes.  She went to the ER at [facility] long but waited 7 and half hours and was never seen and ended up bleeding.  By the time she left her pain was much improved.  Her severe pain lasted about 3 hours.  She had no pain since then.  She has been eating very bland.  Her last ultrasound was 2009 which was unremarkable.  None since then.  No recent dysphagia.  No recent dyspnea or cough.  No skin rash.  Past Medical History:  Diagnosis Date   HERPES ZOSTER 10/18/2008   NECK PAIN 02/15/2009   PARESTHESIA 10/30/2009   PERS HX TOBACCO USE PRESENTING HAZARDS HEALTH 03/10/2010   SINUSITIS, ACUTE 05/03/2009   SORE THROAT 02/12/2009   TMJ SYNDROME 02/11/2009   History reviewed. No pertinent surgical history.  reports that she has been smoking cigarettes. She has a 41.00 pack-year smoking history. She has never used smokeless tobacco. She reports that she does not drink alcohol and does not use drugs. family history includes Arthritis in her father and mother; Cancer in her father and mother; Hyperlipidemia in her father. Allergies  Allergen Reactions   Iodine Hives and Swelling   Shellfish Allergy Hives and Swelling     Review of Systems  Constitutional:  Negative for weight loss.  Respiratory:  Negative for cough and shortness of breath.   Cardiovascular:  Negative for chest pain.  Gastrointestinal:  Positive for abdominal pain and nausea. Negative for blood in stool, diarrhea, heartburn, melena and vomiting.  Genitourinary:  Negative for dysuria.      Objective:     BP 120/70 (BP Location: Left Arm, Patient Position: Sitting, Cuff Size: Normal)   Pulse 73   Temp 98.1 F (36.7 C) (Oral)   Ht 5' 4.17" (1.63 m)   Wt 132 lb 1.6 oz (59.9 kg)   SpO2 96%   BMI 22.55 kg/m  Wt Readings from Last 3 Encounters:  05/18/22 132 lb 1.6 oz (59.9 kg)  03/06/22 131 lb (59.4 kg)  03/02/22 131 lb 3.2 oz (59.5 kg)      Physical Exam Vitals reviewed.  Constitutional:      Appearance: She is well-developed.  Cardiovascular:     Rate and Rhythm: Normal rate and regular rhythm.  Pulmonary:     Effort: Pulmonary effort is normal.     Breath sounds: Normal breath sounds.  Abdominal:     Comments: Abdomen nondistended.  Normal bowel sounds.  Soft with mild tenderness right upper quadrant.  No hepatomegaly noted.  No splenomegaly.  No guarding or rebound.  Neurological:     Mental Status: She is alert.      No results found for any visits on 05/18/22.    The ASCVD Risk score (Arnett DK, et al., 2019) failed to calculate for the following reasons:   Cannot find a previous HDL lab   Cannot find a previous total cholesterol lab    Assessment & Plan:   Problem List Items Addressed This Visit   None Visit Diagnoses     Abdominal pain, RUQ    -  Primary   Relevant Orders   US Abdomen Complete   CBC with Differential/Platelet   CMP   Lipase     Patient presents with onset Saturday morning 4 AM of sudden severe right upper quadrant pain associated with nausea.  Question is whether this represented symptomatic gallstones.  She has had no episodes since then.  -Recommend bland diet until  further evaluated -Set up abdominal ultrasound to further assess -Check labs as above with CBC, CMP, lipase -Follow-up immediately for any fever or recurrent severe pain  No follow-ups on file.    Carolann Littler, MD

## 2022-05-19 LAB — CBC WITH DIFFERENTIAL/PLATELET
Basophils Absolute: 0.1 10*3/uL (ref 0.0–0.1)
Basophils Relative: 0.8 % (ref 0.0–3.0)
Eosinophils Absolute: 0.2 10*3/uL (ref 0.0–0.7)
Eosinophils Relative: 2.4 % (ref 0.0–5.0)
HCT: 41.7 % (ref 36.0–46.0)
Hemoglobin: 14 g/dL (ref 12.0–15.0)
Lymphocytes Relative: 25 % (ref 12.0–46.0)
Lymphs Abs: 2.2 10*3/uL (ref 0.7–4.0)
MCHC: 33.6 g/dL (ref 30.0–36.0)
MCV: 89.6 fl (ref 78.0–100.0)
Monocytes Absolute: 0.7 10*3/uL (ref 0.1–1.0)
Monocytes Relative: 7.7 % (ref 3.0–12.0)
Neutro Abs: 5.7 10*3/uL (ref 1.4–7.7)
Neutrophils Relative %: 64.1 % (ref 43.0–77.0)
Platelets: 221 10*3/uL (ref 150.0–400.0)
RBC: 4.66 Mil/uL (ref 3.87–5.11)
RDW: 13.5 % (ref 11.5–15.5)
WBC: 8.8 10*3/uL (ref 4.0–10.5)

## 2022-05-19 LAB — COMPREHENSIVE METABOLIC PANEL
ALT: 8 U/L (ref 0–35)
AST: 14 U/L (ref 0–37)
Albumin: 4 g/dL (ref 3.5–5.2)
Alkaline Phosphatase: 76 U/L (ref 39–117)
BUN: 15 mg/dL (ref 6–23)
CO2: 26 mEq/L (ref 19–32)
Calcium: 8.7 mg/dL (ref 8.4–10.5)
Chloride: 104 mEq/L (ref 96–112)
Creatinine, Ser: 0.88 mg/dL (ref 0.40–1.20)
GFR: 69.18 mL/min (ref >60.00)
Glucose, Bld: 101 mg/dL — ABNORMAL HIGH (ref 70–99)
Potassium: 3.8 mEq/L (ref 3.5–5.1)
Sodium: 138 mEq/L (ref 135–145)
Total Bilirubin: 0.3 mg/dL (ref 0.2–1.2)
Total Protein: 6.5 g/dL (ref 6.0–8.3)

## 2022-05-19 LAB — LIPASE: Lipase: 25 U/L (ref 11.0–59.0)

## 2022-05-26 ENCOUNTER — Ambulatory Visit (HOSPITAL_BASED_OUTPATIENT_CLINIC_OR_DEPARTMENT_OTHER)
Admission: RE | Admit: 2022-05-26 | Discharge: 2022-05-26 | Disposition: A | Discharge status: Home / Self Care | Payer: Medicare Other | Source: Ambulatory Visit | Attending: Family Medicine | Admitting: Family Medicine

## 2022-05-26 DIAGNOSIS — R1011 Right upper quadrant pain: Secondary | ICD-10-CM | POA: Diagnosis not present

## 2022-06-26 ENCOUNTER — Encounter: Payer: Self-pay | Admitting: Family Medicine

## 2022-06-26 ENCOUNTER — Ambulatory Visit (INDEPENDENT_AMBULATORY_CARE_PROVIDER_SITE_OTHER): Payer: Medicare Other | Admitting: Family Medicine

## 2022-06-26 VITALS — BP 110/70 | HR 64 | Temp 97.7°F | Ht 64.17 in | Wt 132.2 lb

## 2022-06-26 DIAGNOSIS — R7989 Other specified abnormal findings of blood chemistry: Secondary | ICD-10-CM

## 2022-06-26 DIAGNOSIS — M791 Myalgia, unspecified site: Secondary | ICD-10-CM | POA: Diagnosis not present

## 2022-06-26 DIAGNOSIS — Z87891 Personal history of nicotine dependence: Secondary | ICD-10-CM | POA: Diagnosis not present

## 2022-06-26 LAB — TSH: TSH: 2.47 u[IU]/mL (ref 0.35–5.50)

## 2022-06-26 LAB — CK: Total CK: 245 U/L — ABNORMAL HIGH (ref 7–177)

## 2022-06-26 LAB — VITAMIN D 25 HYDROXY (VIT D DEFICIENCY, FRACTURES): VITD: 8.34 ng/mL — ABNORMAL LOW (ref 30.00–100.00)

## 2022-06-26 LAB — C-REACTIVE PROTEIN: CRP: <1 mg/dL (ref 0.5–20.0)

## 2022-06-26 MED ORDER — VITAMIN D (ERGOCALCIFEROL) 1.25 MG (50000 UNIT) PO CAPS: 50000.0000 [IU] | ORAL_CAPSULE | ORAL | 0 refills | Status: DC

## 2022-06-26 NOTE — Patient Instructions (Signed)
Watch out for any weakness, numbness, or low back pain.

## 2022-06-26 NOTE — Addendum Note (Signed)
Addended by: Nilda Riggs on: 06/26/2022 04:11 PM   Modules accepted: Orders

## 2022-06-26 NOTE — Progress Notes (Signed)
Established Patient Office Visit  Subjective   Patient ID: Ann Lee, female    DOB: 07/16/1955  Age: 67 y.o. MRN: [id number]  Chief Complaint  Patient presents with   myalgia    Patient complains of leg myalgias, x3 days    HPI   Ann Lee is seen with new complaint.  She states that for about 3 days now she has had fairly severe achy and somewhat heavy feeling bilateral lower extremities.  This occurs at rest and is not related to walking.  Symptoms seem worse late in the day.  She was concerned she may have developed some sort of "viral myositis ".  She works at [employer].  Constantly exposed to illness there.  Denies any low back pain.  No lower extremity weakness or numbness.  No recent fever.  No loss of urine or stool control.  She took Advil without relief.  She does have a long smoking history but does not describe any claudication type symptoms.  No upper extremity symptoms.  No statin use.  No over-the-counter supplements such as red yeast rice extract.  Denies recent chest pains.  Denies any arthralgias.  No abdominal pain currently.  Past Medical History:  Diagnosis Date   HERPES ZOSTER 10/18/2008   NECK PAIN 02/15/2009   PARESTHESIA 10/30/2009   PERS HX TOBACCO USE PRESENTING HAZARDS HEALTH 03/10/2010   SINUSITIS, ACUTE 05/03/2009   SORE THROAT 02/12/2009   TMJ SYNDROME 02/11/2009   History reviewed. No pertinent surgical history.  reports that she has been smoking cigarettes. She has a 41.00 pack-year smoking history. She has never used smokeless tobacco. She reports that she does not drink alcohol and does not use drugs. family history includes Arthritis in her father and mother; Cancer in her father and mother; Hyperlipidemia in her father. Allergies  Allergen Reactions   Iodine Hives and Swelling   Shellfish Allergy Hives and Swelling    Review of Systems  Constitutional:  Negative for chills and fever.  Respiratory:  Negative for cough and shortness of breath.    Cardiovascular:  Negative for chest pain.  Gastrointestinal:  Negative for abdominal pain.  Musculoskeletal:  Positive for myalgias. Negative for back pain.  Neurological:  Negative for dizziness, tingling and weakness.      Objective:     BP 110/70 (BP Location: Left Arm, Patient Position: Sitting, Cuff Size: Normal)   Pulse 64   Temp 97.7 F (36.5 C) (Oral)   Ht 5' 4.17" (1.63 m)   Wt 132 lb 3.2 oz (60 kg)   SpO2 98%   BMI 22.57 kg/m  BP Readings from Last 3 Encounters:  06/26/22 110/70  05/18/22 120/70  03/06/22 124/80   Wt Readings from Last 3 Encounters:  06/26/22 132 lb 3.2 oz (60 kg)  05/18/22 132 lb 1.6 oz (59.9 kg)  03/06/22 131 lb (59.4 kg)      Physical Exam Vitals reviewed.  Constitutional:      Appearance: Normal appearance.  Cardiovascular:     Rate and Rhythm: Normal rate and regular rhythm.     Comments: Feet are both warm to touch with good capillary refill.  She has 1+ dorsalis pedis and posterior tibial pulses bilaterally. Pulmonary:     Effort: Pulmonary effort is normal.     Breath sounds: Normal breath sounds.  Musculoskeletal:     Right lower leg: No edema.     Left lower leg: No edema.     Comments: No localized tenderness lower extremities.  No edema.  Straight leg raise negative.  Neurological:     Mental Status: She is alert.     Comments: Strength lower extremities.  She has symmetric reflexes lower extremities which are 2+.      No results found for any visits on 06/26/22.    The ASCVD Risk score (Arnett DK, et al., 2019) failed to calculate for the following reasons:   Cannot find a previous HDL lab   Cannot find a previous total cholesterol lab    Assessment & Plan:   Problem List Items Addressed This Visit   None Visit Diagnoses     Myalgia    -  Primary   Relevant Orders   TSH   CK   VITAMIN D 25 Hydroxy (Vit-D Deficiency, Fractures)   C-reactive Protein     Patient presents with lower extremity myalgias of  3-day duration.  No statin use.  She is not describing claudication and has evidence for good circulation on exam.  Does not describe any symptoms consistent with lumbar stenosis.  Etiology unclear.  -Check labs as above with TSH, 25-hydroxy vitamin D, C-reactive protein, CK -Follow-up immediately for any lower extremity weakness or numbness. -Follow-up immediately for any new symptoms such as low back pain  No follow-ups on file.    Carolann Littler, MD

## 2022-07-10 ENCOUNTER — Ambulatory Visit (INDEPENDENT_AMBULATORY_CARE_PROVIDER_SITE_OTHER): Payer: Medicare Other | Admitting: Family Medicine

## 2022-07-10 ENCOUNTER — Encounter: Payer: Self-pay | Admitting: Family Medicine

## 2022-07-10 VITALS — BP 130/76 | HR 76 | Temp 98.7°F | Ht 64.17 in | Wt 129.5 lb

## 2022-07-10 DIAGNOSIS — M25461 Effusion, right knee: Secondary | ICD-10-CM | POA: Diagnosis not present

## 2022-07-10 DIAGNOSIS — M791 Myalgia, unspecified site: Secondary | ICD-10-CM | POA: Diagnosis not present

## 2022-07-10 DIAGNOSIS — R748 Abnormal levels of other serum enzymes: Secondary | ICD-10-CM | POA: Diagnosis not present

## 2022-07-10 LAB — C-REACTIVE PROTEIN: CRP: 1 mg/dL (ref 0.5–20.0)

## 2022-07-10 LAB — SEDIMENTATION RATE: Sed Rate: 29 mm/hr (ref 0–30)

## 2022-07-10 LAB — CK: Total CK: 57 U/L (ref 7–177)

## 2022-07-10 NOTE — Progress Notes (Signed)
Established Patient Office Visit  Subjective   Patient ID: Ann Lee, female    DOB: Sep 23, 1955  Age: 67 y.o. MRN: [id number]  Chief Complaint  Patient presents with   Extremity Weakness   Knee Pain    Patient complains of right knee pain, x1 week, Tried Advil with little relief     HPI   Ann Lee is seen with some right knee pain and swelling which started about a week ago.  Denies any specific injury.  She has not noted any redness but perhaps a little bit of warmth.  No other acute arthralgias.  Denies any history of gout or pseudogout.  This occurred in the setting of recent relatively acute myalgias lower extremities bilaterally.  Refer to prior note for details  Ann Lee is seen with new complaint.  She states that for about 3 days now she has had fairly severe achy and somewhat heavy feeling bilateral lower extremities.  This occurs at rest and is not related to walking.  Symptoms seem worse late in the day.  She was concerned she may have developed some sort of "viral myositis ".  She works at [employer].  Constantly exposed to illness there.  Denies any low back pain.  No lower extremity weakness or numbness.  No recent fever.  No loss of urine or stool control.  She took Advil without relief.  She does have a long smoking history but does not describe any claudication type symptoms.  No upper extremity symptoms.  No statin use.  No over-the-counter supplements such as red yeast rice extract.  Denies recent chest pains.  Denies any arthralgias.  No abdominal pain currently.   Even though she has long smoking history we do not see evidence for significant peripheral vascular disease on exam and not describing claudication symptoms.  Creatinine kinase came back slightly elevated her C-reactive protein was normal.  She had very low vitamin D level of 8 and is now taking weekly replacement.  She does relate that her brother of hers has history of rheumatoid arthritis.  Ann Lee denies any  arthritis involving her hands, wrists, shoulders, or any other joints.  No statin use.  Denies any muscle weakness lower extremities  Past Medical History:  Diagnosis Date   HERPES ZOSTER 10/18/2008   NECK PAIN 02/15/2009   PARESTHESIA 10/30/2009   PERS HX TOBACCO USE PRESENTING HAZARDS HEALTH 03/10/2010   SINUSITIS, ACUTE 05/03/2009   SORE THROAT 02/12/2009   TMJ SYNDROME 02/11/2009   History reviewed. No pertinent surgical history.  reports that she has been smoking cigarettes. She has a 41.00 pack-year smoking history. She has never used smokeless tobacco. She reports that she does not drink alcohol and does not use drugs. family history includes Arthritis in her father and mother; Cancer in her father and mother; Hyperlipidemia in her father. Allergies  Allergen Reactions   Iodine Hives and Swelling   Shellfish Allergy Hives and Swelling     Review of Systems  Constitutional:  Negative for malaise/fatigue.  Eyes:  Negative for blurred vision.  Respiratory:  Negative for shortness of breath.   Cardiovascular:  Negative for chest pain.  Musculoskeletal:  Positive for myalgias. Negative for back pain and falls.  Skin:  Negative for rash.  Neurological:  Negative for dizziness, weakness and headaches.      Objective:     BP 130/76 (BP Location: Left Arm, Patient Position: Sitting, Cuff Size: Normal)   Pulse 76   Temp 98.7 F (37.1 C) (Oral)  Ht 5' 4.17" (1.63 m)   Wt 129 lb 8 oz (58.7 kg)   SpO2 98%   BMI 22.11 kg/m    Physical Exam Vitals reviewed.  Constitutional:      Appearance: Normal appearance.  Cardiovascular:     Rate and Rhythm: Normal rate and regular rhythm.  Pulmonary:     Effort: Pulmonary effort is normal.     Breath sounds: Normal breath sounds.  Musculoskeletal:     Comments: Right knee is minimally swollen with very small effusion.  Possibly very small amount of warmth.  No erythema.  No ecchymosis.  Mild medial joint line tenderness.   Neurological:     Mental Status: She is alert.      No results found for any visits on 07/10/22.    The ASCVD Risk score (Arnett DK, et al., 2019) failed to calculate for the following reasons:   Cannot find a previous HDL lab   Cannot find a previous total cholesterol lab    Assessment & Plan:   Problem List Items Addressed This Visit   None Visit Diagnoses     Myalgia    -  Primary   Relevant Orders   Sedimentation rate   C-reactive Protein   ANA   Rheumatoid Factor   Cyclic citrul peptide antibody, IgG   CK   Effusion of right knee       Relevant Orders   Sedimentation rate   C-reactive Protein   ANA   Rheumatoid Factor   Cyclic citrul peptide antibody, IgG     Ann Lee is seen with acute small right knee effusion.  Difficult to say if this is in any way related to her bilateral myalgias.  No known history of gout or pseudogout.  No recent reported injury.  She also continues to complain of bilateral lower extremity myalgias.  No statin use.  No associated weakness.  Recent C-reactive protein negative.  Positive family rheumatoid arthritis as above.  She did have recent mildly elevated creatinine kinase of uncertain significance.  -Check further labs today with sed rate, C-reactive protein, ANA, rheumatoid factor, CCP antibody, repeat creatinine kinase. -She denies any injury but if knee pain and swelling persist with negative labs consider x-ray to further assess -Consider icing knee 2-3 times daily and consider elastic knee sleeve  No follow-ups on file.    Carolann Littler, MD

## 2022-07-13 LAB — CYCLIC CITRUL PEPTIDE ANTIBODY, IGG: Cyclic Citrullin Peptide Ab: <16 UNITS

## 2022-07-13 LAB — ANTI-NUCLEAR AB-TITER (ANA TITER): ANA Titer 1: 1:40 {titer} — ABNORMAL HIGH

## 2022-07-13 LAB — RHEUMATOID FACTOR: Rheumatoid fact SerPl-aCnc: <14 IU/mL (ref <14)

## 2022-07-13 LAB — ANA: Anti Nuclear Antibody (ANA): POSITIVE — AB

## 2022-07-13 NOTE — Addendum Note (Signed)
Addended by: Eulas Post on: 07/13/2022 08:48 AM   Modules accepted: Orders

## 2022-07-14 ENCOUNTER — Ambulatory Visit (HOSPITAL_BASED_OUTPATIENT_CLINIC_OR_DEPARTMENT_OTHER)
Admission: RE | Admit: 2022-07-14 | Discharge: 2022-07-14 | Disposition: A | Discharge status: Home / Self Care | Payer: Medicare Other | Source: Ambulatory Visit | Attending: Family Medicine | Admitting: Family Medicine

## 2022-07-14 DIAGNOSIS — M25461 Effusion, right knee: Secondary | ICD-10-CM | POA: Insufficient documentation

## 2022-07-24 ENCOUNTER — Other Ambulatory Visit: Payer: Self-pay | Admitting: Family Medicine

## 2022-07-27 NOTE — Progress Notes (Unsigned)
    Ann Mccreedy D.[place] Phone: [phone]   Assessment and Plan:     There are no diagnoses linked to this encounter.  ***   Pertinent previous records reviewed include ***   Follow Up: ***     Subjective:   I, Ann Lee, am serving as a Education administrator for Doctor Glennon Mac  Chief Complaint: right knee pain   HPI:   07/28/2022 Patient is a 67 year old female complaining of right knee pain. Patient states some right knee pain and swelling which started about a week ago. Denies any specific injury. She has not noted any redness but perhaps a little bit of warmth. No other acute arthralgias. Denies any history of gout or pseudogout. This occurred in the setting of recent relatively acute myalgias lower extremities bilaterally.   Relevant Historical Information: ***  Additional pertinent review of systems negative.   Current Outpatient Medications:    acetaminophen (TYLENOL) 500 MG tablet, Take 500 mg by mouth as needed., Disp: , Rfl:    ibuprofen (ADVIL) 200 MG tablet, Take 200 mg by mouth as needed., Disp: , Rfl:    meloxicam (MOBIC) 15 MG tablet, TAKE 1 TABLET (15 MG TOTAL) BY MOUTH DAILY., Disp: 30 tablet, Rfl: 1   SUMAtriptan (IMITREX) 25 MG tablet, TAKE ONE AT ONSET OF MIGRAINE AND MAY REPEAT ONE IN TWO HOURS AS NEEDED (MAX OF 2 IN 24 HOURS), Disp: 10 tablet, Rfl: 3   triamcinolone cream (KENALOG) 0.1 %, Apply 1 Application topically 2 (two) times daily., Disp: 15 g, Rfl: 0   Vitamin D, Ergocalciferol, (DRISDOL) 1.25 MG (50000 UNIT) CAPS capsule, Take 1 capsule (50,000 Units total) by mouth every 7 (seven) days., Disp: 12 capsule, Rfl: 0  Current Facility-Administered Medications:    methylPREDNISolone acetate (DEPO-MEDROL) injection 40 mg, 40 mg, Intramuscular, Once, Lanae Crumbly, PA-C   Objective:     There were no vitals filed for this visit.    There is no height or weight on file  to calculate BMI.    Physical Exam:    ***   Electronically signed by:  [facility] 4:33 PM 07/27/22

## 2022-07-28 ENCOUNTER — Ambulatory Visit: Payer: Medicare Other | Admitting: Sports Medicine

## 2022-07-28 VITALS — BP 120/80 | HR 62 | Ht 64.0 in | Wt 129.0 lb

## 2022-07-28 DIAGNOSIS — M2341 Loose body in knee, right knee: Secondary | ICD-10-CM | POA: Diagnosis not present

## 2022-07-28 DIAGNOSIS — M25561 Pain in right knee: Secondary | ICD-10-CM

## 2022-07-28 DIAGNOSIS — G8929 Other chronic pain: Secondary | ICD-10-CM

## 2022-07-28 DIAGNOSIS — M1711 Unilateral primary osteoarthritis, right knee: Secondary | ICD-10-CM

## 2022-07-28 MED ORDER — MELOXICAM 15 MG PO TABS: 15.0000 mg | ORAL_TABLET | Freq: Every day | ORAL | 0 refills | Status: DC

## 2022-07-28 NOTE — Patient Instructions (Addendum)
Good to see you  - Start meloxicam 15 mg daily x2 weeks.  If still having pain after 2 weeks, complete 3rd-week of meloxicam. May use remaining meloxicam as needed once daily for pain control.  Do not to use additional NSAIDs while taking meloxicam.  May use Tylenol [phone] mg 2 to 3 times a day for breakthrough pain. Knee HEP  2 week follow up

## 2022-08-07 NOTE — Progress Notes (Unsigned)
    Ann Lee D.[place] Phone: [phone]   Assessment and Plan:     There are no diagnoses linked to this encounter.  ***   Pertinent previous records reviewed include ***   Follow Up: ***     Subjective:   I, Raidyn Breiner, am serving as a Education administrator for Doctor Glennon Mac   Chief Complaint: right knee pain    HPI:    07/28/2022 Patient is a 67 year old female complaining of right knee pain. Patient states some right knee pain and swelling which started about a week ago. Denies any specific injury. She has not noted any redness but perhaps a little bit of warmth. No other acute arthralgias. Denies any history of gout or pseudogout. This occurred in the setting of recent relatively acute myalgias lower extremities bilaterally. Her legs feel heavy like she's walking through cement , she is very active, ib didn't help and made her stomach ache , no numbness or tingling   08/11/2022 Patient states   Relevant Historical Information:   None pertinent  Additional pertinent review of systems negative.   Current Outpatient Medications:    acetaminophen (TYLENOL) 500 MG tablet, Take 500 mg by mouth as needed., Disp: , Rfl:    ibuprofen (ADVIL) 200 MG tablet, Take 200 mg by mouth as needed., Disp: , Rfl:    meloxicam (MOBIC) 15 MG tablet, TAKE 1 TABLET (15 MG TOTAL) BY MOUTH DAILY., Disp: 30 tablet, Rfl: 1   meloxicam (MOBIC) 15 MG tablet, Take 1 tablet (15 mg total) by mouth daily., Disp: 30 tablet, Rfl: 0   SUMAtriptan (IMITREX) 25 MG tablet, TAKE ONE AT ONSET OF MIGRAINE AND MAY REPEAT ONE IN TWO HOURS AS NEEDED (MAX OF 2 IN 24 HOURS), Disp: 10 tablet, Rfl: 3   triamcinolone cream (KENALOG) 0.1 %, Apply 1 Application topically 2 (two) times daily., Disp: 15 g, Rfl: 0   Vitamin D, Ergocalciferol, (DRISDOL) 1.25 MG (50000 UNIT) CAPS capsule, Take 1 capsule (50,000 Units total) by mouth every 7 (seven)  days., Disp: 12 capsule, Rfl: 0  Current Facility-Administered Medications:    methylPREDNISolone acetate (DEPO-MEDROL) injection 40 mg, 40 mg, Intramuscular, Once, Lanae Crumbly, PA-C   Objective:     There were no vitals filed for this visit.    There is no height or weight on file to calculate BMI.    Physical Exam:    ***   Electronically signed by:  Ann Lee D.[facility] 8:12 AM 08/07/22

## 2022-08-11 ENCOUNTER — Ambulatory Visit: Payer: Medicare Other | Admitting: Sports Medicine

## 2022-08-11 VITALS — BP 120/80 | HR 68 | Ht 64.0 in | Wt 133.0 lb

## 2022-08-11 DIAGNOSIS — G8929 Other chronic pain: Secondary | ICD-10-CM | POA: Diagnosis not present

## 2022-08-11 DIAGNOSIS — M25561 Pain in right knee: Secondary | ICD-10-CM

## 2022-08-11 DIAGNOSIS — M2341 Loose body in knee, right knee: Secondary | ICD-10-CM

## 2022-08-11 DIAGNOSIS — M1711 Unilateral primary osteoarthritis, right knee: Secondary | ICD-10-CM | POA: Diagnosis not present

## 2022-08-11 NOTE — Patient Instructions (Signed)
Good to see you   

## 2022-08-25 NOTE — Progress Notes (Deleted)
    Benito Mccreedy D.[place] Phone: [phone]   Assessment and Plan:     There are no diagnoses linked to this encounter.  ***   Pertinent previous records reviewed include ***   Follow Up: ***     Subjective:   I, Avani Sensabaugh, am serving as a Education administrator for Doctor Glennon Mac   Chief Complaint: right knee pain    HPI:    07/28/2022 Patient is a 67 year old female complaining of right knee pain. Patient states some right knee pain and swelling which started about a week ago. Denies any specific injury. She has not noted any redness but perhaps a little bit of warmth. No other acute arthralgias. Denies any history of gout or pseudogout. This occurred in the setting of recent relatively acute myalgias lower extremities bilaterally. Her legs feel heavy like she's walking through cement , she is very active, ib didn't help and made her stomach ache , no numbness or tingling    08/11/2022 Patient states that she is the same   09/01/2022 Patient states    Relevant Historical Information:   None pertinent  Additional pertinent review of systems negative.   Current Outpatient Medications:    acetaminophen (TYLENOL) 500 MG tablet, Take 500 mg by mouth as needed., Disp: , Rfl:    ibuprofen (ADVIL) 200 MG tablet, Take 200 mg by mouth as needed., Disp: , Rfl:    meloxicam (MOBIC) 15 MG tablet, TAKE 1 TABLET (15 MG TOTAL) BY MOUTH DAILY., Disp: 30 tablet, Rfl: 1   meloxicam (MOBIC) 15 MG tablet, Take 1 tablet (15 mg total) by mouth daily., Disp: 30 tablet, Rfl: 0   SUMAtriptan (IMITREX) 25 MG tablet, TAKE ONE AT ONSET OF MIGRAINE AND MAY REPEAT ONE IN TWO HOURS AS NEEDED (MAX OF 2 IN 24 HOURS), Disp: 10 tablet, Rfl: 3   triamcinolone cream (KENALOG) 0.1 %, Apply 1 Application topically 2 (two) times daily., Disp: 15 g, Rfl: 0   Vitamin D, Ergocalciferol, (DRISDOL) 1.25 MG (50000 UNIT) CAPS capsule, Take 1  capsule (50,000 Units total) by mouth every 7 (seven) days., Disp: 12 capsule, Rfl: 0  Current Facility-Administered Medications:    methylPREDNISolone acetate (DEPO-MEDROL) injection 40 mg, 40 mg, Intramuscular, Once, Lanae Crumbly, PA-C   Objective:     There were no vitals filed for this visit.    There is no height or weight on file to calculate BMI.    Physical Exam:    ***   Electronically signed by:  Benito Mccreedy D.[facility] 11:02 AM 08/25/22

## 2022-09-01 ENCOUNTER — Ambulatory Visit: Payer: Medicare Other | Admitting: Sports Medicine

## 2022-10-27 ENCOUNTER — Other Ambulatory Visit: Payer: Self-pay | Admitting: Sports Medicine

## 2022-11-25 ENCOUNTER — Other Ambulatory Visit: Payer: Self-pay | Admitting: Family Medicine

## 2022-12-01 ENCOUNTER — Ambulatory Visit: Payer: Medicare Other | Admitting: Family Medicine

## 2022-12-15 ENCOUNTER — Ambulatory Visit: Payer: Medicare Other | Admitting: Sports Medicine

## 2022-12-15 ENCOUNTER — Ambulatory Visit (INDEPENDENT_AMBULATORY_CARE_PROVIDER_SITE_OTHER): Payer: Medicare Other

## 2022-12-15 VITALS — BP 108/68 | HR 77 | Ht 64.0 in | Wt 131.0 lb

## 2022-12-15 DIAGNOSIS — M25562 Pain in left knee: Secondary | ICD-10-CM

## 2022-12-15 DIAGNOSIS — M1711 Unilateral primary osteoarthritis, right knee: Secondary | ICD-10-CM

## 2022-12-15 DIAGNOSIS — M25561 Pain in right knee: Secondary | ICD-10-CM

## 2022-12-15 DIAGNOSIS — M25462 Effusion, left knee: Secondary | ICD-10-CM | POA: Diagnosis not present

## 2022-12-15 DIAGNOSIS — G8929 Other chronic pain: Secondary | ICD-10-CM | POA: Diagnosis not present

## 2022-12-15 MED ORDER — MELOXICAM 15 MG PO TABS: 15.0000 mg | ORAL_TABLET | Freq: Every day | ORAL | 0 refills | Status: DC

## 2022-12-15 NOTE — Progress Notes (Signed)
[name] D.[facility] [address] [place] [address] Phone: [phone]   Assessment and Plan:     1. Chronic pain of right knee 2. Primary osteoarthritis of right knee - Chronic with exacerbation, subsequent visit 3. Acute pain of left knee - Acute, initial visit - Suspected acute flare of left knee pain led to overcompensation of right knee and a flare of chronic right knee pain.  Reassuring that both are moderately improving with relative rest over the past 1 week - X-ray obtained in clinic.  My interpretation: No acute fracture or dislocation.  Mildly decreased medial joint space and anterior superior patellar spur - Start meloxicam 15 mg daily x2 weeks.  If still having pain after 2 weeks, complete 3rd-week of meloxicam. May use remaining meloxicam as needed once daily for pain control.  Do not to use additional NSAIDs while taking meloxicam.  May use Tylenol [phone] mg 2 to 3 times a day for breakthrough pain.    Other orders - meloxicam (MOBIC) 15 MG tablet; Take 1 tablet (15 mg total) by mouth daily.    Pertinent previous records reviewed include none   Follow Up: 3 weeks for reevaluation of bilateral knee pain.  Could consider intra-articular knee CSI for 1 or both knees based on presentation   Subjective:   I, Moenique Parris, am serving as a Neurosurgeon for Doctor Richardean Sale   Chief Complaint: right knee pain    HPI:    07/28/2022 Patient is a 67 year old female complaining of right knee pain. Patient states some right knee pain and swelling which started about a week ago. Denies any specific injury. She has not noted any redness but perhaps a little bit of warmth. No other acute arthralgias. Denies any history of gout or pseudogout. This occurred in the setting of recent relatively acute myalgias lower extremities bilaterally. Her legs feel heavy like she's walking through cement , she is very active, ib didn't help and  made her stomach ache , no numbness or tingling    08/11/2022 Patient states that she is the same   12/15/2022 Patient states that right knee pain intermittently and usually resolves , last week left knee gave out 3 times pain was in the middle of the kneecap and was ten times worst than the right knee today she is okay    Relevant Historical Information:   None pertinent  Additional pertinent review of systems negative.   Current Outpatient Medications:    acetaminophen (TYLENOL) 500 MG tablet, Take 500 mg by mouth as needed., Disp: , Rfl:    ibuprofen (ADVIL) 200 MG tablet, Take 200 mg by mouth as needed., Disp: , Rfl:    meloxicam (MOBIC) 15 MG tablet, TAKE 1 TABLET (15 MG TOTAL) BY MOUTH DAILY., Disp: 30 tablet, Rfl: 1   meloxicam (MOBIC) 15 MG tablet, Take 1 tablet (15 mg total) by mouth daily., Disp: 30 tablet, Rfl: 0   meloxicam (MOBIC) 15 MG tablet, Take 1 tablet (15 mg total) by mouth daily., Disp: 30 tablet, Rfl: 0   SUMAtriptan (IMITREX) 25 MG tablet, TAKE ONE AT ONSET OF MIGRAINE AND MAY REPEAT ONE IN TWO HOURS AS NEEDED (MAX OF 2 IN 24 HOURS), Disp: 10 tablet, Rfl: 3   triamcinolone cream (KENALOG) 0.1 %, Apply 1 Application topically 2 (two) times daily., Disp: 15 g, Rfl: 0   Vitamin D, Ergocalciferol, (DRISDOL) 1.25 MG (50000 UNIT) CAPS capsule, Take 1 capsule (50,000 Units  total) by mouth every 7 (seven) days., Disp: 12 capsule, Rfl: 0  Current Facility-Administered Medications:    methylPREDNISolone acetate (DEPO-MEDROL) injection 40 mg, 40 mg, Intramuscular, Once, Naida Sleight, PA-C   Objective:     Vitals:   12/15/22 1326  BP: 108/68  Pulse: 77  SpO2: 97%  Weight: 131 lb (59.4 kg)  Height: 5\' 4"  (1.626 m)      Body mass index is 22.49 kg/m.    Physical Exam:    General:  awake, alert oriented, no acute distress nontoxic Skin: no suspicious lesions or rashes Neuro:sensation intact and strength 5/5 with no deficits, no atrophy, normal muscle tone Psych:  No signs of anxiety, depression or other mood disorder  Left knee: Crepitus present mild swelling No deformity Neg fluid wave, joint milking ROM Flex 110, Ext 0 TTP medial joint line NTTP over the quad tendon, medial fem condyle, lat fem condyle, patella, plica, patella tendon, tibial tuberostiy, fibular head, posterior fossa, pes anserine bursa, gerdy's tubercle,  lateral jt line Neg anterior and posterior drawer Neg lachman Neg sag sign Negative varus stress Negative valgus stress Negative McMurray Positive patellar grind  Gait normal    Electronically signed by:  [name] D.[facility] 1:48 PM 12/15/22

## 2022-12-15 NOTE — Patient Instructions (Signed)
-   Start meloxicam 15 mg daily x2 weeks.  If still having pain after 2 weeks, complete 3rd-week of meloxicam. May use remaining meloxicam as needed once daily for pain control.  Do not to use additional NSAIDs while taking meloxicam.  May use Tylenol [phone] mg 2 to 3 times a day for breakthrough pain. Continue HEP  3 week follow up

## 2023-01-04 NOTE — Progress Notes (Unsigned)
    Ann Lee [facility] [address] [place] [address] Phone: [phone]   Assessment and Plan:     There are no diagnoses linked to this encounter.  ***   Pertinent previous records reviewed include ***   Follow Up: ***     Subjective:   I, Ann Lee, am serving as a Neurosurgeon for Ann Lee   Chief Complaint: right knee pain    HPI:    07/28/2022 Patient is a 67 year old female complaining of right knee pain. Patient states some right knee pain and swelling which started about a week ago. Denies any specific injury. She has not noted any redness but perhaps a little bit of warmth. No other acute arthralgias. Denies any history of gout or pseudogout. This occurred in the setting of recent relatively acute myalgias lower extremities bilaterally. Her legs feel heavy like she's walking through cement , she is very active, ib didn't help and made her stomach ache , no numbness or tingling    08/11/2022 Patient states that she is the same    12/15/2022 Patient states that right knee pain intermittently and usually resolves , last week left knee gave out 3 times pain was in the middle of the kneecap and was ten times worst than the right knee today she is okay   01/05/2023 Patient states    Relevant Historical Information:   None pertinent  Additional pertinent review of systems negative.   Current Outpatient Medications:    acetaminophen (TYLENOL) 500 MG tablet, Take 500 mg by mouth as needed., Disp: , Rfl:    ibuprofen (ADVIL) 200 MG tablet, Take 200 mg by mouth as needed., Disp: , Rfl:    meloxicam (MOBIC) 15 MG tablet, TAKE 1 TABLET (15 MG TOTAL) BY MOUTH DAILY., Disp: 30 tablet, Rfl: 1   meloxicam (MOBIC) 15 MG tablet, Take 1 tablet (15 mg total) by mouth daily., Disp: 30 tablet, Rfl: 0   meloxicam (MOBIC) 15 MG tablet, Take 1 tablet (15 mg total) by mouth daily., Disp: 30 tablet, Rfl: 0   SUMAtriptan (IMITREX)  25 MG tablet, TAKE ONE AT ONSET OF MIGRAINE AND MAY REPEAT ONE IN TWO HOURS AS NEEDED (MAX OF 2 IN 24 HOURS), Disp: 10 tablet, Rfl: 3   triamcinolone cream (KENALOG) 0.1 %, Apply 1 Application topically 2 (two) times daily., Disp: 15 g, Rfl: 0   Vitamin D, Ergocalciferol, (DRISDOL) 1.25 MG (50000 UNIT) CAPS capsule, Take 1 capsule (50,000 Units total) by mouth every 7 (seven) days., Disp: 12 capsule, Rfl: 0  Current Facility-Administered Medications:    methylPREDNISolone acetate (DEPO-MEDROL) injection 40 mg, 40 mg, Intramuscular, Once, Ann Sleight, PA-C   Objective:     There were no vitals filed for this visit.    There is no height or weight on file to calculate BMI.    Physical Exam:    ***   Electronically signed by:  Ann [facility] 7:15 AM 01/04/23

## 2023-01-05 ENCOUNTER — Ambulatory Visit: Payer: Medicare Other | Admitting: Sports Medicine

## 2023-01-05 VITALS — HR 82 | Ht 64.0 in | Wt 134.0 lb

## 2023-01-05 DIAGNOSIS — G8929 Other chronic pain: Secondary | ICD-10-CM

## 2023-01-05 DIAGNOSIS — M1711 Unilateral primary osteoarthritis, right knee: Secondary | ICD-10-CM | POA: Diagnosis not present

## 2023-01-05 DIAGNOSIS — M25561 Pain in right knee: Secondary | ICD-10-CM

## 2023-01-05 DIAGNOSIS — M25562 Pain in left knee: Secondary | ICD-10-CM | POA: Diagnosis not present

## 2023-01-17 ENCOUNTER — Other Ambulatory Visit: Payer: Self-pay | Admitting: Sports Medicine

## 2023-02-26 ENCOUNTER — Encounter: Payer: Self-pay | Admitting: Family Medicine

## 2023-02-26 ENCOUNTER — Telehealth: Payer: Self-pay | Admitting: Family Medicine

## 2023-02-26 ENCOUNTER — Ambulatory Visit (INDEPENDENT_AMBULATORY_CARE_PROVIDER_SITE_OTHER): Payer: Medicare Other | Admitting: Family Medicine

## 2023-02-26 VITALS — BP 134/76 | HR 75 | Temp 97.6°F | Ht 64.0 in | Wt 132.5 lb

## 2023-02-26 DIAGNOSIS — Z8742 Personal history of other diseases of the female genital tract: Secondary | ICD-10-CM | POA: Diagnosis not present

## 2023-02-26 DIAGNOSIS — Z780 Asymptomatic menopausal state: Secondary | ICD-10-CM | POA: Diagnosis not present

## 2023-02-26 DIAGNOSIS — Z833 Family history of diabetes mellitus: Secondary | ICD-10-CM

## 2023-02-26 DIAGNOSIS — Z0000 Encounter for general adult medical examination without abnormal findings: Secondary | ICD-10-CM

## 2023-02-26 MED ORDER — CLOTRIMAZOLE-BETAMETHASONE 1-0.05 % EX CREA: 1.0000 | TOPICAL_CREAM | Freq: Every day | CUTANEOUS | 1 refills | Status: DC

## 2023-02-26 NOTE — Patient Instructions (Signed)
I have ordered the DEXA scan  Set up mammogram  Set up labs for this Monday.   Consider repeat Cologuard or colonoscopy in 2 years.

## 2023-02-26 NOTE — Progress Notes (Signed)
Established Patient Office Visit  Subjective   Patient ID: Ann Lee, female    DOB: 08-11-1955  Age: 67 y.o. MRN: [id number]  Chief Complaint  Patient presents with   Annual Exam    HPI   Ann Lee is seen for physical exam.  She has history of migraine headaches and ongoing nicotine use.  Takes no regular medications.  Generally doing well.  Does have angular cheilitis corners of the mouth present for several months.  Tried over-the-counter topical without improvement.  Health maintenance reviewed:  -We discussed several vaccines today including influenza, Prevnar 20, Shingrix and she declines all -Does agree to setting up DEXA scan -She agrees to set up mammogram.  Has not had mammogram in several years -Negative Cologuard 11/23. -She declines further Pap smears -She is participating with annual low-dose CT lung cancer screening  Family history-father still alive age 69.  He has had history of colon cancer and Raynaud's.  Mom died age 69.  She had some type of dementia, history of multiple strokes, larynx cancer, and non-Hodgkin's lymphoma.  She has 5 siblings.  1 sister died of lung cancer.  She has a brother with rheumatoid arthritis.  Social history she is divorced (2001).  She has 5 children.  Smokes 1/2 pack cigarettes per day.  No alcohol.  6 grandchildren.  Past Medical History:  Diagnosis Date   HERPES ZOSTER 10/18/2008   NECK PAIN 02/15/2009   PARESTHESIA 10/30/2009   PERS HX TOBACCO USE PRESENTING HAZARDS HEALTH 03/10/2010   SINUSITIS, ACUTE 05/03/2009   SORE THROAT 02/12/2009   TMJ SYNDROME 02/11/2009   History reviewed. No pertinent surgical history.  reports that she has been smoking cigarettes. She has a 41 pack-year smoking history. She has never used smokeless tobacco. She reports that she does not drink alcohol and does not use drugs. family history includes Arthritis in her father and mother; Cancer in her father and mother; Hyperlipidemia in her  father. Allergies  Allergen Reactions   Iodine Hives and Swelling   Shellfish Allergy Hives and Swelling    Review of Systems  Constitutional:  Negative for malaise/fatigue.  Eyes:  Negative for blurred vision.  Respiratory:  Negative for shortness of breath.   Cardiovascular:  Negative for chest pain.  Neurological:  Negative for dizziness, weakness and headaches.      Objective:     BP 134/76 (BP Location: Left Arm, Patient Position: Sitting, Cuff Size: Normal)   Pulse 75   Temp 97.6 F (36.4 C) (Oral)   Ht 5\' 4"  (1.626 m)   Wt 132 lb 8 oz (60.1 kg)   SpO2 98%   BMI 22.74 kg/m  BP Readings from Last 3 Encounters:  02/26/23 134/76  12/15/22 108/68  08/11/22 120/80   Wt Readings from Last 3 Encounters:  02/26/23 132 lb 8 oz (60.1 kg)  01/05/23 134 lb (60.8 kg)  12/15/22 131 lb (59.4 kg)      Physical Exam Vitals reviewed.  Constitutional:      Appearance: She is well-developed.  HENT:     Head: Normocephalic and atraumatic.     Right Ear: Tympanic membrane normal.     Left Ear: Tympanic membrane normal.  Eyes:     Pupils: Pupils are equal, round, and reactive to light.  Neck:     Thyroid: No thyromegaly.  Cardiovascular:     Rate and Rhythm: Normal rate and regular rhythm.     Heart sounds: Normal heart sounds.  Pulmonary:  Effort: No respiratory distress.     Breath sounds: Normal breath sounds. No wheezing or rales.  Abdominal:     General: Bowel sounds are normal. There is no distension.     Palpations: Abdomen is soft. There is no mass.     Tenderness: There is no abdominal tenderness. There is no guarding or rebound.  Musculoskeletal:        General: Normal range of motion.     Cervical back: Normal range of motion and neck supple.     Right lower leg: No edema.     Left lower leg: No edema.  Lymphadenopathy:     Cervical: No cervical adenopathy.  Skin:    Findings: No rash.  Neurological:     Mental Status: She is alert and oriented to  person, place, and time.     Cranial Nerves: No cranial nerve deficit.  Psychiatric:        Behavior: Behavior normal.        Thought Content: Thought content normal.        Judgment: Judgment normal.      No results found for any visits on 02/26/23.    The ASCVD Risk score (Arnett DK, et al., 2019) failed to calculate for the following reasons:   Cannot find a previous HDL lab   Cannot find a previous total cholesterol lab    Assessment & Plan:   Problem List Items Addressed This Visit   None Visit Diagnoses     Postmenopausal    -  Primary   Relevant Orders   DG Bone Density   Physical exam       Relevant Orders   Basic metabolic panel   Lipid panel   CBC with Differential/Platelet   Hepatic function panel   Hemoglobin A1c   Family history of diabetes mellitus       Relevant Orders   Hemoglobin A1c     Discussed several health maintenance items as follows  -Recommend she consider flu vaccine, Prevnar 20, and Shingrix and she declines -Set up repeat mammogram -Continue annual low-dose CT lung cancer screening -She will be due for colon cancer screening a couple years.  Would suggest ideally colonoscopy given her family history as above -She is encouraged to stop smoking -Obtain follow-up screening labs as above.  Include A1c with family history of type 2 diabetes -Set up DEXA scan -Prescribe Lotrisone cream to use for her angular cheilitis to use twice daily.  Be in touch if not clearing over the next few weeks  No follow-ups on file.    [name], MD

## 2023-02-26 NOTE — Telephone Encounter (Signed)
Pt would like to know if she can have her labs for her physical done early. She is scheduled for her physical at 10:30, however she doesn't think she can fast that long.

## 2023-02-26 NOTE — Telephone Encounter (Signed)
Noted  

## 2023-03-02 ENCOUNTER — Other Ambulatory Visit: Payer: Medicare Other

## 2023-03-04 ENCOUNTER — Other Ambulatory Visit (INDEPENDENT_AMBULATORY_CARE_PROVIDER_SITE_OTHER): Payer: Medicare Other

## 2023-03-04 DIAGNOSIS — Z0000 Encounter for general adult medical examination without abnormal findings: Secondary | ICD-10-CM | POA: Diagnosis not present

## 2023-03-04 DIAGNOSIS — Z833 Family history of diabetes mellitus: Secondary | ICD-10-CM | POA: Diagnosis not present

## 2023-03-04 DIAGNOSIS — R7989 Other specified abnormal findings of blood chemistry: Secondary | ICD-10-CM

## 2023-03-04 DIAGNOSIS — Z780 Asymptomatic menopausal state: Secondary | ICD-10-CM

## 2023-03-04 LAB — CBC WITH DIFFERENTIAL/PLATELET
Basophils Absolute: 0.1 10*3/uL (ref 0.0–0.1)
Basophils Relative: 0.9 % (ref 0.0–3.0)
Eosinophils Absolute: 0.2 10*3/uL (ref 0.0–0.7)
Eosinophils Relative: 3.1 % (ref 0.0–5.0)
HCT: 44.8 % (ref 36.0–46.0)
Hemoglobin: 14.6 g/dL (ref 12.0–15.0)
Lymphocytes Relative: 23.7 % (ref 12.0–46.0)
Lymphs Abs: 1.7 10*3/uL (ref 0.7–4.0)
MCHC: 32.5 g/dL (ref 30.0–36.0)
MCV: 89.8 fl (ref 78.0–100.0)
Monocytes Absolute: 0.6 10*3/uL (ref 0.1–1.0)
Monocytes Relative: 9 % (ref 3.0–12.0)
Neutro Abs: 4.5 10*3/uL (ref 1.4–7.7)
Neutrophils Relative %: 63.3 % (ref 43.0–77.0)
Platelets: 194 10*3/uL (ref 150.0–400.0)
RBC: 4.99 Mil/uL (ref 3.87–5.11)
RDW: 14.5 % (ref 11.5–15.5)
WBC: 7.1 10*3/uL (ref 4.0–10.5)

## 2023-03-04 LAB — BASIC METABOLIC PANEL
BUN: 12 mg/dL (ref 6–23)
CO2: 27 mEq/L (ref 19–32)
Calcium: 9.3 mg/dL (ref 8.4–10.5)
Chloride: 106 mEq/L (ref 96–112)
Creatinine, Ser: 0.91 mg/dL (ref 0.40–1.20)
GFR: 66.09 mL/min (ref >60.00)
Glucose, Bld: 93 mg/dL (ref 70–99)
Potassium: 4.3 mEq/L (ref 3.5–5.1)
Sodium: 141 mEq/L (ref 135–145)

## 2023-03-04 LAB — LIPID PANEL
Cholesterol: 187 mg/dL (ref 0–200)
HDL: 30.9 mg/dL — ABNORMAL LOW (ref >39.00)
LDL Cholesterol: 116 mg/dL — ABNORMAL HIGH (ref 0–99)
NonHDL: 155.66
Total CHOL/HDL Ratio: 6
Triglycerides: 199 mg/dL — ABNORMAL HIGH (ref 0.0–149.0)
VLDL: 39.8 mg/dL (ref 0.0–40.0)

## 2023-03-04 LAB — HEPATIC FUNCTION PANEL
ALT: 10 U/L (ref 0–35)
AST: 14 U/L (ref 0–37)
Albumin: 4 g/dL (ref 3.5–5.2)
Alkaline Phosphatase: 73 U/L (ref 39–117)
Bilirubin, Direct: 0.1 mg/dL (ref 0.0–0.3)
Total Bilirubin: 0.4 mg/dL (ref 0.2–1.2)
Total Protein: 6.6 g/dL (ref 6.0–8.3)

## 2023-03-04 LAB — VITAMIN D 25 HYDROXY (VIT D DEFICIENCY, FRACTURES): VITD: 8.47 ng/mL — ABNORMAL LOW (ref 30.00–100.00)

## 2023-03-04 LAB — HEMOGLOBIN A1C: Hgb A1c MFr Bld: 6 % (ref 4.6–6.5)

## 2023-03-05 MED ORDER — VITAMIN D (ERGOCALCIFEROL) 1.25 MG (50000 UNIT) PO CAPS: 50000.0000 [IU] | ORAL_CAPSULE | ORAL | 0 refills | Status: DC

## 2023-03-11 ENCOUNTER — Other Ambulatory Visit (HOSPITAL_BASED_OUTPATIENT_CLINIC_OR_DEPARTMENT_OTHER): Payer: Medicare Other

## 2023-03-23 ENCOUNTER — Ambulatory Visit (INDEPENDENT_AMBULATORY_CARE_PROVIDER_SITE_OTHER): Payer: Medicare Other | Admitting: Family Medicine

## 2023-03-23 ENCOUNTER — Encounter: Payer: Self-pay | Admitting: Family Medicine

## 2023-03-23 VITALS — BP 128/90 | HR 60 | Temp 97.6°F | Ht 64.0 in | Wt 131.7 lb

## 2023-03-23 DIAGNOSIS — I700 Atherosclerosis of aorta: Secondary | ICD-10-CM | POA: Diagnosis not present

## 2023-03-23 DIAGNOSIS — I714 Abdominal aortic aneurysm, without rupture, unspecified: Secondary | ICD-10-CM | POA: Diagnosis not present

## 2023-03-23 DIAGNOSIS — R1011 Right upper quadrant pain: Secondary | ICD-10-CM | POA: Diagnosis not present

## 2023-03-23 LAB — CBC WITH DIFFERENTIAL/PLATELET
Basophils Absolute: 0.1 10*3/uL (ref 0.0–0.1)
Basophils Relative: 0.9 % (ref 0.0–3.0)
Eosinophils Absolute: 0.1 10*3/uL (ref 0.0–0.7)
Eosinophils Relative: 2.3 % (ref 0.0–5.0)
HCT: 44.5 % (ref 36.0–46.0)
Hemoglobin: 14.5 g/dL (ref 12.0–15.0)
Lymphocytes Relative: 25 % (ref 12.0–46.0)
Lymphs Abs: 1.6 10*3/uL (ref 0.7–4.0)
MCHC: 32.6 g/dL (ref 30.0–36.0)
MCV: 89.5 fL (ref 78.0–100.0)
Monocytes Absolute: 0.6 10*3/uL (ref 0.1–1.0)
Monocytes Relative: 9.4 % (ref 3.0–12.0)
Neutro Abs: 4.1 10*3/uL (ref 1.4–7.7)
Neutrophils Relative %: 62.4 % (ref 43.0–77.0)
Platelets: 218 10*3/uL (ref 150.0–400.0)
RBC: 4.97 Mil/uL (ref 3.87–5.11)
RDW: 14.2 % (ref 11.5–15.5)
WBC: 6.5 10*3/uL (ref 4.0–10.5)

## 2023-03-23 LAB — POCT URINALYSIS DIPSTICK
Bilirubin, UA: NEGATIVE
Blood, UA: POSITIVE
Glucose, UA: NEGATIVE
Ketones, UA: NEGATIVE
Nitrite, UA: NEGATIVE
Protein, UA: NEGATIVE
Spec Grav, UA: 1.01 (ref 1.010–1.025)
Urobilinogen, UA: 0.2 U/dL
pH, UA: 6 (ref 5.0–8.0)
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
## 2023-03-23 NOTE — Addendum Note (Signed)
Addended by: Vickii Chafe on: 03/23/2023 10:49 AM   Modules accepted: Orders

## 2023-03-24 LAB — URINE CULTURE
MICRO NUMBER:: 15567001
SPECIMEN QUALITY:: ADEQUATE

## 2023-03-25 ENCOUNTER — Encounter: Payer: Self-pay | Admitting: Family Medicine

## 2023-03-26 NOTE — Telephone Encounter (Signed)
Please see result note message sent to PCP regarding message below

## 2023-04-06 ENCOUNTER — Other Ambulatory Visit: Payer: Self-pay | Admitting: Acute Care

## 2023-04-06 ENCOUNTER — Inpatient Hospital Stay: Admission: RE | Admit: 2023-04-06 | Payer: Medicare Other | Source: Ambulatory Visit

## 2023-04-06 DIAGNOSIS — F17210 Nicotine dependence, cigarettes, uncomplicated: Secondary | ICD-10-CM

## 2023-04-06 DIAGNOSIS — Z87891 Personal history of nicotine dependence: Secondary | ICD-10-CM

## 2023-04-06 DIAGNOSIS — Z122 Encounter for screening for malignant neoplasm of respiratory organs: Secondary | ICD-10-CM

## 2023-04-14 ENCOUNTER — Ambulatory Visit
Admission: RE | Admit: 2023-04-14 | Discharge: 2023-04-14 | Disposition: A | Discharge status: Home / Self Care | Payer: Medicare Other | Source: Ambulatory Visit | Attending: Acute Care | Admitting: Acute Care

## 2023-04-14 DIAGNOSIS — Z87891 Personal history of nicotine dependence: Secondary | ICD-10-CM

## 2023-04-14 DIAGNOSIS — F17210 Nicotine dependence, cigarettes, uncomplicated: Secondary | ICD-10-CM | POA: Diagnosis not present

## 2023-04-14 DIAGNOSIS — Z122 Encounter for screening for malignant neoplasm of respiratory organs: Secondary | ICD-10-CM

## 2023-05-17 ENCOUNTER — Other Ambulatory Visit: Payer: Self-pay

## 2023-05-17 DIAGNOSIS — Z87891 Personal history of nicotine dependence: Secondary | ICD-10-CM

## 2023-05-17 DIAGNOSIS — Z122 Encounter for screening for malignant neoplasm of respiratory organs: Secondary | ICD-10-CM

## 2023-05-17 DIAGNOSIS — F17210 Nicotine dependence, cigarettes, uncomplicated: Secondary | ICD-10-CM

## 2023-07-16 ENCOUNTER — Encounter: Payer: Self-pay | Admitting: Family Medicine

## 2023-07-16 ENCOUNTER — Ambulatory Visit (INDEPENDENT_AMBULATORY_CARE_PROVIDER_SITE_OTHER): Payer: Medicare Other | Admitting: Family Medicine

## 2023-07-16 VITALS — BP 126/82 | HR 64 | Temp 97.7°F | Wt 133.0 lb

## 2023-07-16 DIAGNOSIS — H9202 Otalgia, left ear: Secondary | ICD-10-CM | POA: Diagnosis not present

## 2023-07-16 DIAGNOSIS — J0100 Acute maxillary sinusitis, unspecified: Secondary | ICD-10-CM

## 2023-07-16 MED ORDER — AMOXICILLIN 500 MG PO CAPS: 500.0000 mg | ORAL_CAPSULE | Freq: Three times a day (TID) | ORAL | 0 refills | Status: DC

## 2023-07-16 NOTE — Progress Notes (Signed)
Established Patient Office Visit  Subjective   Patient ID: Ann Lee, female    DOB: Sep 04, 1955  Age: 68 y.o. MRN: [id number]  Chief Complaint  Patient presents with   Ear Pain    Patient complains of left ear pain, x2 days, Tried Tylenol   Sinusitis    Patient complains of sinusitis, x2 days     HPI   Ann Lee is seen today as a work and with left ear pain past couple days and progressive sinus pain especially left maxillary region.  Denies any sore throat.  No fevers or chills.  No cough.  She is concerned about developing sinusitis.  Second some Tylenol with minimal relief.  Denies any acute hearing changes.  No vertigo.  Past Medical History:  Diagnosis Date   HERPES ZOSTER 10/18/2008   NECK PAIN 02/15/2009   PARESTHESIA 10/30/2009   PERS HX TOBACCO USE PRESENTING HAZARDS HEALTH 03/10/2010   SINUSITIS, ACUTE 05/03/2009   SORE THROAT 02/12/2009   TMJ SYNDROME 02/11/2009   History reviewed. No pertinent surgical history.  reports that she has been smoking cigarettes. She has a 41 pack-year smoking history. She has never used smokeless tobacco. She reports that she does not drink alcohol and does not use drugs. family history includes Arthritis in her father and mother; Cancer in her father and mother; Hyperlipidemia in her father. Allergies  Allergen Reactions   Iodine Hives and Swelling   Shellfish Allergy Hives and Swelling    Review of Systems  Constitutional:  Negative for chills and fever.  HENT:  Positive for congestion, ear pain and sinus pain. Negative for ear discharge, hearing loss, sore throat and tinnitus.   Respiratory:  Negative for cough.       Objective:     BP 126/82 (BP Location: Left Arm, Patient Position: Sitting, Cuff Size: Normal)   Pulse 64   Temp 97.7 F (36.5 C) (Oral)   Wt 133 lb (60.3 kg)   SpO2 99%   BMI 22.83 kg/m  BP Readings from Last 3 Encounters:  07/16/23 126/82  03/23/23 (!) 128/90  02/26/23 134/76   Wt Readings from  Last 3 Encounters:  07/16/23 133 lb (60.3 kg)  03/23/23 131 lb 11.2 oz (59.7 kg)  02/26/23 132 lb 8 oz (60.1 kg)      Physical Exam Vitals reviewed.  Constitutional:      General: She is not in acute distress.    Appearance: Normal appearance. She is not ill-appearing.  HENT:     Ears:     Comments: Both eardrums appear normal.  Gray color with good landmarks.  No effusion.  No erythema.    Mouth/Throat:     Mouth: Mucous membranes are moist.     Pharynx: Oropharynx is clear. No oropharyngeal exudate or posterior oropharyngeal erythema.  Cardiovascular:     Rate and Rhythm: Normal rate and regular rhythm.  Pulmonary:     Effort: Pulmonary effort is normal.     Breath sounds: Normal breath sounds. No wheezing or rales.  Neurological:     Mental Status: She is alert.      No results found for any visits on 07/16/23.    The 10-year ASCVD risk score (Arnett DK, et al., 2019) is: 12.7%    Assessment & Plan:   Ann Lee presents with left otalgia with basically normal exam.  She does describe increasing left maxillary sinus pressure and pain.  Symptoms have been relatively brief but may have evolving sinusitis.  Watch for  any purulent discharge or bloody discharge or any fever.  If so start amoxicillin 500 mg 3 times daily for 7 days.  Stay well-hydrated.  Follow-up for any persistent or worsening symptoms Ann Peat, MD

## 2023-08-12 NOTE — Progress Notes (Unsigned)
    [facility] [address] [place] [address] Phone: [phone]   Assessment and Plan:     There are no diagnoses linked to this encounter.  ***   Pertinent previous records reviewed include ***    Follow Up: ***     Subjective:   I, Ann Lee, am serving as a Neurosurgeon for Doctor Richardean Sale   Chief Complaint: right knee pain    HPI:    07/28/2022 Patient is a 68 year old female complaining of right knee pain. Patient states some right knee pain and swelling which started about a week ago. Denies any specific injury. She has not noted any redness but perhaps a little bit of warmth. No other acute arthralgias. Denies any history of gout or pseudogout. This occurred in the setting of recent relatively acute myalgias lower extremities bilaterally. Her legs feel heavy like she's walking through cement , she is very active, ib didn't help and made her stomach ache , no numbness or tingling    08/11/2022 Patient states that she is the same    12/15/2022 Patient states that right knee pain intermittently and usually resolves , last week left knee gave out 3 times pain was in the middle of the kneecap and was ten times worst than the right knee today she is okay   01/05/2023 Patient states she is okay she has intermittent pain    08/13/2023 Patient states   Relevant Historical Information:   None pertinent Additional pertinent review of systems negative.   Current Outpatient Medications:    acetaminophen (TYLENOL) 500 MG tablet, Take 500 mg by mouth as needed., Disp: , Rfl:    amoxicillin (AMOXIL) 500 MG capsule, Take 1 capsule (500 mg total) by mouth 3 (three) times daily., Disp: 21 capsule, Rfl: 0   clotrimazole-betamethasone (LOTRISONE) cream, Apply 1 Application topically daily., Disp: 15 g, Rfl: 1   ibuprofen (ADVIL) 200 MG tablet, Take 200 mg by mouth as needed., Disp: , Rfl:    meloxicam (MOBIC) 15 MG tablet,  Take 1 tablet (15 mg total) by mouth daily. (Patient not taking: Reported on 03/23/2023), Disp: 30 tablet, Rfl: 0   SUMAtriptan (IMITREX) 25 MG tablet, TAKE ONE AT ONSET OF MIGRAINE AND MAY REPEAT ONE IN TWO HOURS AS NEEDED (MAX OF 2 IN 24 HOURS), Disp: 10 tablet, Rfl: 3   triamcinolone cream (KENALOG) 0.1 %, Apply 1 Application topically 2 (two) times daily. (Patient not taking: Reported on 03/23/2023), Disp: 15 g, Rfl: 0   Vitamin D, Ergocalciferol, (DRISDOL) 1.25 MG (50000 UNIT) CAPS capsule, Take 1 capsule (50,000 Units total) by mouth every 7 (seven) days. (Patient not taking: Reported on 03/23/2023), Disp: 12 capsule, Rfl: 0   Objective:     There were no vitals filed for this visit.    There is no height or weight on file to calculate BMI.    Physical Exam:    ***   Electronically signed by:  Ann [facility] 7:39 AM 08/12/23

## 2023-08-13 ENCOUNTER — Ambulatory Visit: Payer: Medicare Other | Admitting: Sports Medicine

## 2023-08-13 VITALS — BP 108/78 | HR 86 | Ht 64.0 in | Wt 134.0 lb

## 2023-08-13 DIAGNOSIS — M1712 Unilateral primary osteoarthritis, left knee: Secondary | ICD-10-CM | POA: Diagnosis not present

## 2023-08-13 DIAGNOSIS — G8929 Other chronic pain: Secondary | ICD-10-CM

## 2023-08-13 DIAGNOSIS — M25562 Pain in left knee: Secondary | ICD-10-CM

## 2023-08-13 NOTE — Patient Instructions (Signed)
 Continue HEP  Tylenol as needed  As needed  follow up

## 2023-09-03 ENCOUNTER — Encounter: Payer: Self-pay | Admitting: Family Medicine

## 2023-09-03 ENCOUNTER — Ambulatory Visit (INDEPENDENT_AMBULATORY_CARE_PROVIDER_SITE_OTHER): Admitting: Family Medicine

## 2023-09-03 VITALS — BP 142/84 | HR 68 | Temp 97.4°F | Wt 132.5 lb

## 2023-09-03 DIAGNOSIS — R21 Rash and other nonspecific skin eruption: Secondary | ICD-10-CM

## 2023-09-03 DIAGNOSIS — R030 Elevated blood-pressure reading, without diagnosis of hypertension: Secondary | ICD-10-CM

## 2023-09-03 MED ORDER — CLOBETASOL PROPIONATE 0.05 % EX SHAM: 1.0000 | MEDICATED_SHAMPOO | Freq: Every day | CUTANEOUS | 0 refills | Status: DC | PRN

## 2023-09-03 MED ORDER — TRIAMCINOLONE ACETONIDE 0.1 % EX CREA: 1.0000 | TOPICAL_CREAM | Freq: Two times a day (BID) | CUTANEOUS | 2 refills | Status: DC

## 2023-09-03 NOTE — Patient Instructions (Signed)
 Monitor blood pressure and be in touch if consistently > 140/90.

## 2023-09-03 NOTE — Progress Notes (Signed)
 Established Patient Office Visit  Subjective   Patient ID: Ann Lee, female    DOB: December 21, 1955  Age: 68 y.o. MRN: [id number]  Chief Complaint  Patient presents with   Skin Problem    HPI   Ann Lee is seen today for the following items  She just developed rash which is somewhat of a burning quality across her upper abdominal region.  Only minimal pruritic.  She also has some scalp tenderness bilaterally mostly occipital area but spreading to parietal area as well.  Denies any recent changes shampoos.  She is describing soreness to touch of her scalp in different areas.  No crusting.  No vesicles.  No recent change of soaps or detergents.  Blood pressure up today but no history of hypertension.  This is atypical for her.  Denies any headaches.  No dizziness.  Past Medical History:  Diagnosis Date   HERPES ZOSTER 10/18/2008   NECK PAIN 02/15/2009   PARESTHESIA 10/30/2009   PERS HX TOBACCO USE PRESENTING HAZARDS HEALTH 03/10/2010   SINUSITIS, ACUTE 05/03/2009   SORE THROAT 02/12/2009   TMJ SYNDROME 02/11/2009   History reviewed. No pertinent surgical history.  reports that she has been smoking cigarettes. She has a 41 pack-year smoking history. She has never used smokeless tobacco. She reports that she does not drink alcohol and does not use drugs. family history includes Arthritis in her father and mother; Cancer in her father and mother; Hyperlipidemia in her father. Allergies  Allergen Reactions   Iodine Hives and Swelling   Shellfish Allergy Hives and Swelling    Review of Systems  Constitutional:  Negative for chills and fever.  Cardiovascular:  Negative for chest pain.  Skin:  Positive for rash.  Neurological:  Negative for headaches.      Objective:     BP (!) 142/84 (BP Location: Left Arm, Cuff Size: Normal)   Pulse 68   Temp (!) 97.4 F (36.3 C) (Oral)   Wt 132 lb 8 oz (60.1 kg)   SpO2 99%   BMI 22.74 kg/m  BP Readings from Last 3 Encounters:   09/03/23 (!) 142/84  08/13/23 108/78  07/16/23 126/82   Wt Readings from Last 3 Encounters:  09/03/23 132 lb 8 oz (60.1 kg)  08/13/23 134 lb (60.8 kg)  07/16/23 133 lb (60.3 kg)      Physical Exam Vitals reviewed.  Constitutional:      General: She is not in acute distress.    Appearance: She is not ill-appearing.  Cardiovascular:     Rate and Rhythm: Normal rate and regular rhythm.  Musculoskeletal:     Right lower leg: No edema.     Left lower leg: No edema.  Skin:    Comments: Upper abdominal region bilaterally reveals slightly raised dermatitis rash.  No pustules.  No vesicles.  Mild dryness.  Scalp reveals a few scattered areas of erythema which blanch with pressure.  No pustules.  No vesicles.  Neurological:     Mental Status: She is alert.      No results found for any visits on 09/03/23.    The 10-year ASCVD risk score (Arnett DK, et al., 2019) is: 15.8%    Assessment & Plan:   #1 elevated blood pressure without history of hypertension.  Repeat blood pressure did improve some.  Recommend close home monitoring and be in touch if consistently greater than 140/90.  Handout on DASH diet given.  Try to keep daily sodium intake down.  She does  cook at home mostly which should help with sodium intake.  Tries to eat less processed.  #2 skin rash upper abdominal region.  Nonspecific dermatitis.  Etiology unclear.  Try triamcinolone 0.1% cream twice daily as needed  #3 scalp irritation.  She is describing some soreness occipital area mostly of scalp.  Does have some mild patchy areas of erythema but nonscaly.  No vesicles.  No pustules.  Etiology unclear.  Consider trial of clobetasol shampoo to use 2-3 times weekly.  Be in touch if not improving over the next couple weeks  [name], MD

## 2023-09-24 ENCOUNTER — Ambulatory Visit (INDEPENDENT_AMBULATORY_CARE_PROVIDER_SITE_OTHER): Admitting: Family Medicine

## 2023-09-24 ENCOUNTER — Ambulatory Visit: Admitting: Family Medicine

## 2023-09-24 ENCOUNTER — Encounter: Payer: Self-pay | Admitting: Family Medicine

## 2023-09-24 VITALS — BP 158/90 | HR 70 | Temp 97.9°F | Wt 132.8 lb

## 2023-09-24 DIAGNOSIS — I10 Essential (primary) hypertension: Secondary | ICD-10-CM | POA: Diagnosis not present

## 2023-09-24 DIAGNOSIS — I714 Abdominal aortic aneurysm, without rupture, unspecified: Secondary | ICD-10-CM | POA: Diagnosis not present

## 2023-09-24 MED ORDER — AMLODIPINE BESYLATE 5 MG PO TABS: 5.0000 mg | ORAL_TABLET | Freq: Every day | ORAL | 5 refills | Status: DC

## 2023-09-24 NOTE — Progress Notes (Signed)
 Established Patient Office Visit  Subjective   Patient ID: Ann Lee, female    DOB: [birth year]  Age: 68 y.o. MRN: [id number]  Chief Complaint  Patient presents with   Hypertension    HPI   Trannie is seen with several elevated blood pressures both here and at home.  She is fairly consistently getting 150s to 160s recently.  She has had some stress issues which she thinks may be contributing.  Denies any headaches.  No chest pains.  No peripheral edema.  She admits that she eats too much sodium at times.  No alcohol.  Has never been treated for hypertension previously.  No regular nonsteroidal use.  She does have history of abdominal aortic aneurysm 3.1 cm by imaging 2023 with recommended 3-year follow-up.  Past Medical History:  Diagnosis Date   HERPES ZOSTER 10/18/2008   NECK PAIN 02/15/2009   PARESTHESIA 10/30/2009   PERS HX TOBACCO USE PRESENTING HAZARDS HEALTH 03/10/2010   SINUSITIS, ACUTE 05/03/2009   SORE THROAT 02/12/2009   TMJ SYNDROME 02/11/2009   History reviewed. No pertinent surgical history.  reports that she has been smoking cigarettes. She has a 41 pack-year smoking history. She has never used smokeless tobacco. She reports that she does not drink alcohol and does not use drugs. family history includes Arthritis in her father and mother; Cancer in her father and mother; Hyperlipidemia in her father. Allergies  Allergen Reactions   Iodine Hives and Swelling   Shellfish Allergy Hives and Swelling    Review of Systems  Constitutional:  Negative for malaise/fatigue.  Eyes:  Negative for blurred vision.  Respiratory:  Negative for shortness of breath.   Cardiovascular:  Negative for chest pain.  Neurological:  Negative for dizziness, weakness and headaches.      Objective:     BP (!) 158/90 (BP Location: Left Arm, Cuff Size: Normal)   Pulse 70   Temp 97.9 F (36.6 C) (Oral)   Wt 132 lb 12.8 oz (60.2 kg)   SpO2 95%   BMI 22.80 kg/m  BP Readings from  Last 3 Encounters:  09/24/23 (!) 158/90  09/03/23 (!) 142/84  08/13/23 108/78   Wt Readings from Last 3 Encounters:  09/24/23 132 lb 12.8 oz (60.2 kg)  09/03/23 132 lb 8 oz (60.1 kg)  08/13/23 134 lb (60.8 kg)      Physical Exam Vitals reviewed.  Constitutional:      General: She is not in acute distress.    Appearance: She is well-developed. She is not ill-appearing.  Eyes:     Pupils: Pupils are equal, round, and reactive to light.  Neck:     Thyroid: No thyromegaly.     Vascular: No JVD.  Cardiovascular:     Rate and Rhythm: Normal rate and regular rhythm.     Heart sounds:     No gallop.  Pulmonary:     Effort: Pulmonary effort is normal. No respiratory distress.     Breath sounds: Normal breath sounds. No wheezing or rales.  Musculoskeletal:     Cervical back: Neck supple.     Right lower leg: No edema.     Left lower leg: No edema.  Neurological:     Mental Status: She is alert.      No results found for any visits on 09/24/23.  Last CBC Lab Results  Component Value Date   WBC 6.5 03/23/2023   HGB 14.5 03/23/2023   HCT 44.5 03/23/2023   MCV 89.5 03/23/2023  MCH 29.2 10/01/2020   RDW 14.2 03/23/2023   PLT 218.0 03/23/2023   Last metabolic panel Lab Results  Component Value Date   GLUCOSE 93 03/04/2023   NA 141 03/04/2023   K 4.3 03/04/2023   CL 106 03/04/2023   CO2 27 03/04/2023   BUN 12 03/04/2023   CREATININE 0.91 03/04/2023   GFR 66.09 03/04/2023   CALCIUM 9.3 03/04/2023   PROT 6.6 03/04/2023   ALBUMIN 4.0 03/04/2023   BILITOT 0.4 03/04/2023   ALKPHOS 73 03/04/2023   AST 14 03/04/2023   ALT 10 03/04/2023   Last lipids Lab Results  Component Value Date   CHOL 187 03/04/2023   HDL 30.90 (L) 03/04/2023   LDLCALC 116 (H) 03/04/2023   LDLDIRECT 147.9 06/20/2010   TRIG 199.0 (H) 03/04/2023   CHOLHDL 6 03/04/2023   Last hemoglobin A1c Lab Results  Component Value Date   HGBA1C 6.0 03/04/2023      The 10-year ASCVD risk score  (Arnett DK, et al., 2019) is: 19.1%    Assessment & Plan:   #1 hypertension currently untreated.  We discussed pharmacologic and nonpharmacologic management.  Stressed importance of avoiding regular nonsteroidals.  She does not use any alcohol.  Try to keep daily sodium intake less than 2000 mg.  Reduce processed foods Start amlodipine 5 mg once daily Set up office follow-up in about 3 weeks to reassess  #2 abdominal aortic aneurysm 3.1 cm on imaging 12/23.  Recommended 3-year follow-up.  [name], MD

## 2023-10-15 ENCOUNTER — Ambulatory Visit (INDEPENDENT_AMBULATORY_CARE_PROVIDER_SITE_OTHER): Admitting: Family Medicine

## 2023-10-15 ENCOUNTER — Encounter: Payer: Self-pay | Admitting: Family Medicine

## 2023-10-15 VITALS — BP 120/70 | HR 65 | Temp 98.0°F | Wt 132.7 lb

## 2023-10-15 DIAGNOSIS — I10 Essential (primary) hypertension: Secondary | ICD-10-CM

## 2023-10-15 MED ORDER — AMLODIPINE BESYLATE 5 MG PO TABS: 5.0000 mg | ORAL_TABLET | Freq: Every day | ORAL | 3 refills | Status: DC

## 2023-10-15 NOTE — Progress Notes (Signed)
   Established Patient Office Visit  Subjective   Patient ID: Ann Lee, female    DOB: 1956-03-16  Age: 68 y.o. MRN: [id number]  Chief Complaint  Patient presents with   Medical Management of Chronic Issues    HPI   Ann Lee is here for follow-up hypertension.  Refer to last note for details.  We start amlodipine  5 mg daily.  Tolerating well with no side effects.  No edema.  No headaches.  Blood pressures are much improved.  No dizziness.  Past Medical History:  Diagnosis Date   HERPES ZOSTER 10/18/2008   NECK PAIN 02/15/2009   PARESTHESIA 10/30/2009   PERS HX TOBACCO USE PRESENTING HAZARDS HEALTH 03/10/2010   SINUSITIS, ACUTE 05/03/2009   SORE THROAT 02/12/2009   TMJ SYNDROME 02/11/2009   History reviewed. No pertinent surgical history.  reports that she has been smoking cigarettes. She has a 41 pack-year smoking history. She has never used smokeless tobacco. She reports that she does not drink alcohol and does not use drugs. family history includes Arthritis in her father and mother; Cancer in her father and mother; Hyperlipidemia in her father. Allergies  Allergen Reactions   Iodine Hives and Swelling   Shellfish Allergy Hives and Swelling    Review of Systems  Constitutional:  Negative for malaise/fatigue.  Eyes:  Negative for blurred vision.  Respiratory:  Negative for shortness of breath.   Cardiovascular:  Negative for chest pain.  Neurological:  Negative for dizziness, weakness and headaches.      Objective:     BP 120/70 (BP Location: Left Arm, Patient Position: Sitting, Cuff Size: Normal)   Pulse 65   Temp 98 F (36.7 C) (Oral)   Wt 132 lb 11.2 oz (60.2 kg)   SpO2 97%   BMI 22.78 kg/m  BP Readings from Last 3 Encounters:  10/15/23 120/70  09/24/23 (!) 158/90  09/03/23 (!) 142/84   Wt Readings from Last 3 Encounters:  10/15/23 132 lb 11.2 oz (60.2 kg)  09/24/23 132 lb 12.8 oz (60.2 kg)  09/03/23 132 lb 8 oz (60.1 kg)      Physical Exam Vitals  reviewed.  Constitutional:      Appearance: She is well-developed.  Eyes:     Pupils: Pupils are equal, round, and reactive to light.  Neck:     Thyroid : No thyromegaly.     Vascular: No JVD.  Cardiovascular:     Rate and Rhythm: Normal rate and regular rhythm.     Heart sounds:     No gallop.  Pulmonary:     Effort: Pulmonary effort is normal. No respiratory distress.     Breath sounds: Normal breath sounds. No wheezing or rales.  Musculoskeletal:     Cervical back: Neck supple.     Right lower leg: No edema.     Left lower leg: No edema.  Neurological:     Mental Status: She is alert.      No results found for any visits on 10/15/23.    The 10-year ASCVD risk score (Arnett DK, et al., 2019) is: 15.4%    Assessment & Plan:   Hypertension greatly improved with the addition of amlodipine  5 mg daily.  Sent in refills of amlodipine  5 mg daily #90 with 3 refills.  Set up physical for June.  Handout on hypertension given.  Ann Lamy, MD

## 2023-11-09 ENCOUNTER — Ambulatory Visit (INDEPENDENT_AMBULATORY_CARE_PROVIDER_SITE_OTHER): Admitting: Family Medicine

## 2023-11-09 ENCOUNTER — Encounter: Payer: Self-pay | Admitting: Family Medicine

## 2023-11-09 VITALS — BP 140/80 | HR 74 | Temp 98.3°F | Wt 136.1 lb

## 2023-11-09 DIAGNOSIS — B029 Zoster without complications: Secondary | ICD-10-CM | POA: Diagnosis not present

## 2023-11-09 MED ORDER — VALACYCLOVIR HCL 1 G PO TABS: 1000.0000 mg | ORAL_TABLET | Freq: Three times a day (TID) | ORAL | 0 refills | Status: DC

## 2023-11-09 NOTE — Progress Notes (Signed)
 Established Patient Office Visit  Subjective   Patient ID: Ann Lee, female    DOB: 07-30-1955  Age: 68 y.o. MRN: [id number]  Chief Complaint  Patient presents with   Herpes Zoster    HPI   Ann Lee is seen with painful blistery rash underneath her left breast left anterior chest wall.  First noted Friday.  She states she has had shingles once previously involving anterior knee.  This rash is stinging and burning quality.  8 out of 10 intensity intermittently over the weekend.  Does not have any involvement of the back.  No fever.  She has taken Aleve and Tylenol  without much improvement.  She did get some relief with lidocaine patch over-the-counter which allowed her to get some sleep.  She currently is coping fairly well with the pain and wishes to avoid sedating pain medications if possible.  Past Medical History:  Diagnosis Date   HERPES ZOSTER 10/18/2008   NECK PAIN 02/15/2009   PARESTHESIA 10/30/2009   PERS HX TOBACCO USE PRESENTING HAZARDS HEALTH 03/10/2010   SINUSITIS, ACUTE 05/03/2009   SORE THROAT 02/12/2009   TMJ SYNDROME 02/11/2009   History reviewed. No pertinent surgical history.  reports that she has been smoking cigarettes. She has a 41 pack-year smoking history. She has never used smokeless tobacco. She reports that she does not drink alcohol and does not use drugs. family history includes Arthritis in her father and mother; Cancer in her father and mother; Hyperlipidemia in her father. Allergies  Allergen Reactions   Iodine Hives and Swelling   Shellfish Allergy Hives and Swelling    Review of Systems  Constitutional:  Negative for chills and fever.  Skin:  Positive for rash.      Objective:     BP (!) 140/80 (BP Location: Left Arm, Patient Position: Sitting, Cuff Size: Normal)   Pulse 74   Temp 98.3 F (36.8 C) (Oral)   Wt 136 lb 1.6 oz (61.7 kg)   SpO2 96%   BMI 23.36 kg/m  BP Readings from Last 3 Encounters:  11/09/23 (!) 140/80  10/15/23  120/70  09/24/23 (!) 158/90   Wt Readings from Last 3 Encounters:  11/09/23 136 lb 1.6 oz (61.7 kg)  10/15/23 132 lb 11.2 oz (60.2 kg)  09/24/23 132 lb 12.8 oz (60.2 kg)      Physical Exam Vitals reviewed.  Constitutional:      General: She is not in acute distress.    Appearance: She is not ill-appearing.  Cardiovascular:     Rate and Rhythm: Normal rate and regular rhythm.  Pulmonary:     Effort: Pulmonary effort is normal.     Breath sounds: Normal breath sounds.  Skin:    Findings: Rash present.     Comments: Rash left mid chest region underneath the left breast.  She has some erythematous patches with superficial vesiculation following dermatome distribution.  No back involvement.  Neurological:     Mental Status: She is alert.      No results found for any visits on 11/09/23.    The 10-year ASCVD risk score (Arnett DK, et al., 2019) is: 20.4%    Assessment & Plan:   Acute shingles involving left mid thoracic region.  Patient is having moderate pain.  She did get some relief with over-the-counter lidocaine patch.  We offered treatment with medication such as gabapentin but she declines at this time.  We did recommend she consider starting Valtrex  1 g 3 times daily for 7  days.  Keep clean with soap and water.  Follow-up for any signs of secondary infection or other concerns  [name], MD

## 2023-11-10 ENCOUNTER — Encounter: Payer: Self-pay | Admitting: Family Medicine

## 2023-11-10 ENCOUNTER — Telehealth (INDEPENDENT_AMBULATORY_CARE_PROVIDER_SITE_OTHER): Admitting: Family Medicine

## 2023-11-10 VITALS — BP 127/73

## 2023-11-10 DIAGNOSIS — J0100 Acute maxillary sinusitis, unspecified: Secondary | ICD-10-CM | POA: Diagnosis not present

## 2023-11-10 MED ORDER — AMOXICILLIN 875 MG PO TABS
875.0000 mg | ORAL_TABLET | Freq: Two times a day (BID) | ORAL | 0 refills | Status: AC
Start: 2023-11-10 — End: 2023-11-20

## 2023-11-10 NOTE — Progress Notes (Signed)
 Patient ID: Ann Lee, female   DOB: June 17, 1955, 68 y.o.   MRN: [id number]   Virtual Visit via Video Note  I connected with Symphonie Tarango on 11/10/23 at  3:30 PM EDT by a video enabled telemedicine application and verified that I am speaking with the correct person using two identifiers.  Location patient: home Location provider:work or home office Persons participating in the virtual visit: patient, provider  I discussed the limitations of evaluation and management by telemedicine and the availability of in person appointments. The patient expressed understanding and agreed to proceed.   HPI:  Ann Lee was just seen yesterday with shingles.  Today she woke up with severe right facial pain and some even swelling.  She had increased pressure right ethmoid and maxillary sinus region.  No fevers or chills.  Has had some nasal congestion.  No cough.  No sore throat.  She has been prone to sinusitis in the past.  No bloody nasal discharge.  Denies any headache currently.  ROS: See pertinent positives and negatives per HPI.  Past Medical History:  Diagnosis Date   HERPES ZOSTER 10/18/2008   NECK PAIN 02/15/2009   PARESTHESIA 10/30/2009   PERS HX TOBACCO USE PRESENTING HAZARDS HEALTH 03/10/2010   SINUSITIS, ACUTE 05/03/2009   SORE THROAT 02/12/2009   TMJ SYNDROME 02/11/2009    History reviewed. No pertinent surgical history.  Family History  Problem Relation Age of Onset   Cancer Mother        esophageal   Arthritis Mother    Hyperlipidemia Father    Arthritis Father    Cancer Father        colon    SOCIAL HX: Long history of smoking   Current Outpatient Medications:    acetaminophen  (TYLENOL ) 500 MG tablet, Take 500 mg by mouth as needed., Disp: , Rfl:    amLODipine  (NORVASC ) 5 MG tablet, Take 1 tablet (5 mg total) by mouth daily., Disp: 90 tablet, Rfl: 3   amoxicillin  (AMOXIL ) 500 MG capsule, Take 1 capsule (500 mg total) by mouth 3 (three) times daily., Disp: 21 capsule,  Rfl: 0   Clobetasol  Propionate 0.05 % shampoo, Apply 1 Application topically daily as needed., Disp: 118 mL, Rfl: 0   clotrimazole -betamethasone  (LOTRISONE ) cream, Apply 1 Application topically daily., Disp: 15 g, Rfl: 1   ibuprofen  (ADVIL ) 200 MG tablet, Take 200 mg by mouth as needed., Disp: , Rfl:    meloxicam  (MOBIC ) 15 MG tablet, Take 1 tablet (15 mg total) by mouth daily., Disp: 30 tablet, Rfl: 0   SUMAtriptan  (IMITREX ) 25 MG tablet, TAKE ONE AT ONSET OF MIGRAINE AND MAY REPEAT ONE IN TWO HOURS AS NEEDED (MAX OF 2 IN 24 HOURS), Disp: 10 tablet, Rfl: 3   triamcinolone  cream (KENALOG ) 0.1 %, Apply 1 Application topically 2 (two) times daily., Disp: 15 g, Rfl: 0   triamcinolone  cream (KENALOG ) 0.1 %, Apply 1 Application topically 2 (two) times daily., Disp: 30 g, Rfl: 2   valACYclovir  (VALTREX ) 1000 MG tablet, Take 1 tablet (1,000 mg total) by mouth 3 (three) times daily., Disp: 21 tablet, Rfl: 0   Vitamin D , Ergocalciferol , (DRISDOL ) 1.25 MG (50000 UNIT) CAPS capsule, Take 1 capsule (50,000 Units total) by mouth every 7 (seven) days., Disp: 12 capsule, Rfl: 0  EXAM:  VITALS per patient if applicable:  GENERAL: alert, oriented, appears well and in no acute distress  HEENT: atraumatic, conjunttiva clear, no obvious abnormalities on inspection of external nose and ears  NECK: normal movements of  the head and neck  LUNGS: on inspection no signs of respiratory distress, breathing rate appears normal, no obvious gross SOB, gasping or wheezing  CV: no obvious cyanosis  MS: moves all visible extremities without noticeable abnormality  PSYCH/NEURO: pleasant and cooperative, no obvious depression or anxiety, speech and thought processing grossly intact  ASSESSMENT AND PLAN:  Discussed the following assessment and plan:  Acute right maxillary sinus pain.  Patient woke up with symptoms this morning.  Even though symptoms are very acute she has had similar symptoms with acute sinusitis in the  past and feels like she has even some mild swelling.  No history of trauma.  No fever.  We agreed to send in amoxicillin  875 mg twice daily for 10 days.  Follow-up for any persistent or worsening symptoms     I discussed the assessment and treatment plan with the patient. The patient was provided an opportunity to ask questions and all were answered. The patient agreed with the plan and demonstrated an understanding of the instructions.   The patient was advised to call back or seek an in-person evaluation if the symptoms worsen or if the condition fails to improve as anticipated.     Ann Lamy, MD

## 2023-11-23 ENCOUNTER — Ambulatory Visit: Admitting: Sports Medicine

## 2023-11-23 VITALS — BP 138/86 | HR 78 | Ht 64.0 in | Wt 131.0 lb

## 2023-11-23 DIAGNOSIS — M1712 Unilateral primary osteoarthritis, left knee: Secondary | ICD-10-CM | POA: Diagnosis not present

## 2023-11-23 DIAGNOSIS — M25562 Pain in left knee: Secondary | ICD-10-CM | POA: Diagnosis not present

## 2023-11-23 DIAGNOSIS — G8929 Other chronic pain: Secondary | ICD-10-CM | POA: Diagnosis not present

## 2023-11-23 NOTE — Progress Notes (Signed)
 Ben Haasini Patnaude D.[facility] [address] [place] [address] Phone: [phone]   Assessment and Plan:     1. Chronic pain of left knee (Primary) 2. Primary osteoarthritis of left knee -Chronic with exacerbation, subsequent visit - Recurrent flare of left knee pain consistent with flare of osteoarthritis over the past 2 to 3 weeks - Patient has had significant relief in the past with intra-articular CSI with last CSI on 08/13/2023.  Patient elected for repeat CSI and tolerated well per note below.  CSI may temporarily increase blood pressure in patient with past medical history of hypertension - Continue HEP - Continue Tylenol  for day-to-day pain relief  Procedure: Knee Joint Injection Side: Left Indication: Flare of osteoarthritis  Risks explained and consent was given verbally. The site was cleaned with alcohol prep. A needle was introduced with an anterio-lateral approach. Injection given using 2mL of 1% lidocaine without epinephrine and 1mL of kenalog  40mg /ml. This was well tolerated and resulted in symptomatic relief.  Needle was removed, hemostasis achieved, and post injection instructions were explained.   Pt was advised to call or return to clinic if these symptoms worsen or fail to improve as anticipated.     Pertinent previous records reviewed include none  Follow Up: As needed.  Could consider repeat CSI if patient receives >3 months relief   Subjective:   I, Moenique Parris, am serving as a Neurosurgeon for Doctor Ulysees Gander   Chief Complaint: right knee pain    HPI:    07/28/2022 Patient is a 68 year old female complaining of right knee pain. Patient states some right knee pain and swelling which started about a week ago. Denies any specific injury. She has not noted any redness but perhaps a little bit of warmth. No other acute arthralgias. Denies any history of gout or pseudogout. This occurred in the setting of recent  relatively acute myalgias lower extremities bilaterally. Her legs feel heavy like she's walking through cement , she is very active, ib didn't help and made her stomach ache , no numbness or tingling    08/11/2022 Patient states that she is the same    12/15/2022 Patient states that right knee pain intermittently and usually resolves , last week left knee gave out 3 times pain was in the middle of the kneecap and was ten times worst than the right knee today she is okay   01/05/2023 Patient states she is okay she has intermittent pain    08/13/2023 Patient states that her pain came back a month a go. Last week pain increased   11/23/2023 Patient states was doing really well until Friday nigh left knee gave out on her    Relevant Historical Information:   None pertinent  Additional pertinent review of systems negative.   Current Outpatient Medications:    acetaminophen  (TYLENOL ) 500 MG tablet, Take 500 mg by mouth as needed., Disp: , Rfl:    amLODipine  (NORVASC ) 5 MG tablet, Take 1 tablet (5 mg total) by mouth daily., Disp: 90 tablet, Rfl: 3   amoxicillin  (AMOXIL ) 500 MG capsule, Take 1 capsule (500 mg total) by mouth 3 (three) times daily., Disp: 21 capsule, Rfl: 0   Clobetasol  Propionate 0.05 % shampoo, Apply 1 Application topically daily as needed., Disp: 118 mL, Rfl: 0   clotrimazole -betamethasone  (LOTRISONE ) cream, Apply 1 Application topically daily., Disp: 15 g, Rfl: 1   ibuprofen  (ADVIL ) 200 MG tablet, Take 200 mg by mouth as needed., Disp: ,  Rfl:    meloxicam  (MOBIC ) 15 MG tablet, Take 1 tablet (15 mg total) by mouth daily., Disp: 30 tablet, Rfl: 0   SUMAtriptan  (IMITREX ) 25 MG tablet, TAKE ONE AT ONSET OF MIGRAINE AND MAY REPEAT ONE IN TWO HOURS AS NEEDED (MAX OF 2 IN 24 HOURS), Disp: 10 tablet, Rfl: 3   triamcinolone  cream (KENALOG ) 0.1 %, Apply 1 Application topically 2 (two) times daily., Disp: 15 g, Rfl: 0   triamcinolone  cream (KENALOG ) 0.1 %, Apply 1 Application topically 2  (two) times daily., Disp: 30 g, Rfl: 2   valACYclovir  (VALTREX ) 1000 MG tablet, Take 1 tablet (1,000 mg total) by mouth 3 (three) times daily., Disp: 21 tablet, Rfl: 0   Vitamin D , Ergocalciferol , (DRISDOL ) 1.25 MG (50000 UNIT) CAPS capsule, Take 1 capsule (50,000 Units total) by mouth every 7 (seven) days., Disp: 12 capsule, Rfl: 0   Objective:     Vitals:   11/23/23 1516  BP: 138/86  Pulse: 78  SpO2: 98%  Weight: 131 lb (59.4 kg)  Height: 5\' 4"  (1.626 m)      Body mass index is 22.49 kg/m.    Physical Exam:    General:  awake, alert oriented, no acute distress nontoxic Skin: no suspicious lesions or rashes Neuro:sensation intact and strength 5/5 with no deficits, no atrophy, normal muscle tone Psych: No signs of anxiety, depression or other mood disorder   Left knee: No swelling No deformity Neg fluid wave, joint milking ROM Flex 110, Ext 0 TTP lateral femoral condyle, lateral joint line, medial femoral condyle, medial joint line NTTP over the quad tendon,  , patella, plica, patella tendon, tibial tuberostiy, fibular head, posterior fossa, pes anserine bursa, gerdy's tubercle,   Neg anterior and posterior drawer Neg lachman Neg sag sign Negative varus stress Negative valgus stress Negative McMurray for palpable pop Positive Thessaly   Gait normal       Electronically signed by:  Marshall Skeeter D.[facility] 3:29 PM 11/23/23

## 2023-11-30 ENCOUNTER — Ambulatory Visit (INDEPENDENT_AMBULATORY_CARE_PROVIDER_SITE_OTHER)

## 2023-11-30 ENCOUNTER — Ambulatory Visit: Admitting: Sports Medicine

## 2023-11-30 VITALS — BP 120/70 | HR 77 | Ht 64.0 in | Wt 136.6 lb

## 2023-11-30 DIAGNOSIS — M79641 Pain in right hand: Secondary | ICD-10-CM

## 2023-11-30 DIAGNOSIS — M79644 Pain in right finger(s): Secondary | ICD-10-CM

## 2023-11-30 DIAGNOSIS — M7989 Other specified soft tissue disorders: Secondary | ICD-10-CM | POA: Diagnosis not present

## 2023-11-30 NOTE — Progress Notes (Signed)
 Ben Gearl Baratta D.[facility] [address] [place] [address] Phone: [phone]   Assessment and Plan:     1. Right hand pain -Acute, initial visit - 2-day onset of swelling, significant pain at third DIP without specific MOI.  Differential includes flare of osteoarthritis versus gout/pseudogout - X-ray obtained in clinic.  My interpretation: No acute fracture or dislocation.  - Patient elected for intra-articular CSI.  Tolerated well per note below  Procedure: Third DIP joint Injection Side: Right Diagnosis: Pain at third DIP   After explaining the procedure, viable alternatives, risks, and answering any questions, consent was given verbally. The site was cleaned with alcohol prep.  An ultrasound transducer was placed on the 3rd DIP joint.  A steroid injection was performed under ultrasound guidance using 0.5ml of 1% lidocaine without epinephrine and 20 mg of Kenalog  40. This was well tolerated and resulted in  relief.  Needle was removed and dressing placed and post injection instructions were given including  a discussion of likely return of pain today after the anesthetic wears off (with the possibility of worsened pain) until the steroid starts to work in 1-3 days.   Pt was advised to call or return to clinic if these symptoms worsen or fail to improve as anticipated. Images permanently stored.    Pertinent previous records reviewed include none  Follow Up: 2 to 3 weeks for reevaluation.  Could consider prednisone  course   Subjective:   I, Leone Ralphs am a scribe for Dr. Cleora Daft.   Chief Complaint: hand pain   HPI:   11/30/23 Patient is a 68 year old female presenting to [facility] for Finger pain in third finger. Patient states that it is very swollen for the past 2 days.  Sudden onset without specific MOI.  Never had this pain before.  Duration? Yesterday morning Did you have an Injury to cause this pain? no Taking  Medication for pain? Aleve, ice, warm salt water Numbness or Tingling? no Does the pain Radiate? no Altered gait or use?yes ROM/ impairment of movement?yes    Relevant Historical Information: none pertinent   Additional pertinent review of systems negative.   Current Outpatient Medications:    acetaminophen  (TYLENOL ) 500 MG tablet, Take 500 mg by mouth as needed., Disp: , Rfl:    amLODipine  (NORVASC ) 5 MG tablet, Take 1 tablet (5 mg total) by mouth daily., Disp: 90 tablet, Rfl: 3   amoxicillin  (AMOXIL ) 500 MG capsule, Take 1 capsule (500 mg total) by mouth 3 (three) times daily., Disp: 21 capsule, Rfl: 0   Clobetasol  Propionate 0.05 % shampoo, Apply 1 Application topically daily as needed., Disp: 118 mL, Rfl: 0   clotrimazole -betamethasone  (LOTRISONE ) cream, Apply 1 Application topically daily., Disp: 15 g, Rfl: 1   ibuprofen  (ADVIL ) 200 MG tablet, Take 200 mg by mouth as needed., Disp: , Rfl:    meloxicam  (MOBIC ) 15 MG tablet, Take 1 tablet (15 mg total) by mouth daily., Disp: 30 tablet, Rfl: 0   SUMAtriptan  (IMITREX ) 25 MG tablet, TAKE ONE AT ONSET OF MIGRAINE AND MAY REPEAT ONE IN TWO HOURS AS NEEDED (MAX OF 2 IN 24 HOURS), Disp: 10 tablet, Rfl: 3   triamcinolone  cream (KENALOG ) 0.1 %, Apply 1 Application topically 2 (two) times daily., Disp: 15 g, Rfl: 0   triamcinolone  cream (KENALOG ) 0.1 %, Apply 1 Application topically 2 (two) times daily., Disp: 30 g, Rfl: 2   valACYclovir  (VALTREX ) 1000 MG tablet, Take 1 tablet (  1,000 mg total) by mouth 3 (three) times daily., Disp: 21 tablet, Rfl: 0   Vitamin D , Ergocalciferol , (DRISDOL ) 1.25 MG (50000 UNIT) CAPS capsule, Take 1 capsule (50,000 Units total) by mouth every 7 (seven) days., Disp: 12 capsule, Rfl: 0   Objective:     Vitals:   11/30/23 1558  BP: 120/70  Pulse: 77  SpO2: 97%  Weight: 136 lb 9.6 oz (62 kg)  Height: 5' 4 (1.626 m)      Body mass index is 23.45 kg/m.    Physical Exam:    General: Appears well, nad,  nontoxic and pleasant Neuro:sensation intact, strength is 5/5 in upper extremities, muscle tone wnl Skin:no susupicious lesions or rashes  Right hand/Wrist:   Mild erythema and swelling to third DIP.  No ecchymosis, open lesion, drainage Capillary refill intact Sensation intact TTP third DIP NTTP distal phalanx, middle phalanx, proximal phalanx, PIP, MCP, extensor tendon and flexor tendon Pain with passive and active motion of third digit  Electronically signed by:  Marshall Skeeter D.[facility] 4:34 PM 11/30/23

## 2023-11-30 NOTE — Patient Instructions (Addendum)
 Injected finger today. Follow up as needed in 2 weeks.

## 2023-12-01 ENCOUNTER — Ambulatory Visit: Admitting: Sports Medicine

## 2023-12-03 ENCOUNTER — Ambulatory Visit: Payer: Self-pay | Admitting: Sports Medicine

## 2023-12-22 ENCOUNTER — Encounter: Payer: Self-pay | Admitting: Family Medicine

## 2023-12-22 ENCOUNTER — Ambulatory Visit

## 2023-12-22 ENCOUNTER — Ambulatory Visit (INDEPENDENT_AMBULATORY_CARE_PROVIDER_SITE_OTHER): Admitting: Family Medicine

## 2023-12-22 VITALS — BP 128/78 | HR 72 | Temp 97.6°F | Wt 135.5 lb

## 2023-12-22 DIAGNOSIS — K5900 Constipation, unspecified: Secondary | ICD-10-CM

## 2023-12-22 DIAGNOSIS — R140 Abdominal distension (gaseous): Secondary | ICD-10-CM

## 2023-12-22 NOTE — Patient Instructions (Signed)
 Drink plenty of fluids  Fiber- at least 25 grams per day  Consider trial of Miralax 17 grams once daily.

## 2023-12-22 NOTE — Progress Notes (Signed)
 Established Patient Office Visit  Subjective   Patient ID: Ann Lee, female    DOB: 08/23/1955  Age: 68 y.o. MRN: [id number]  Chief Complaint  Patient presents with   Back Pain    HPI   Ann Lee is seen today with low back pain for couple weeks.  Relatively constant discomfort.  She thinks this may be related to the constipation.  She states she has had several weeks of small caliber pellet-like stools.  She feels like she is not evacuating her colon.  She has had to strain for bowel movements but is still having bowel movements.  No bloody stools.  She did Cologuard 11/23.  No history of colonoscopy.  Denies any recent injury.  No radiculitis symptoms.  Denies any appetite or weight changes.  No fever.  Is taken some Advil  without much improvement.  Back pain not related to movement.  She did recently have shingles but this was thoracic and this pain is not related to that area.  He has not taken any over-the-counter medications for constipation  Past Medical History:  Diagnosis Date   HERPES ZOSTER 10/18/2008   NECK PAIN 02/15/2009   PARESTHESIA 10/30/2009   PERS HX TOBACCO USE PRESENTING HAZARDS HEALTH 03/10/2010   SINUSITIS, ACUTE 05/03/2009   SORE THROAT 02/12/2009   TMJ SYNDROME 02/11/2009   History reviewed. No pertinent surgical history.  reports that she has been smoking cigarettes. She has a 41 pack-year smoking history. She has never used smokeless tobacco. She reports that she does not drink alcohol and does not use drugs. family history includes Arthritis in her father and mother; Cancer in her father and mother; Hyperlipidemia in her father. Allergies  Allergen Reactions   Iodine Hives and Swelling   Shellfish Allergy Hives and Swelling    Review of Systems  Constitutional:  Negative for chills, fever and weight loss.  Cardiovascular:  Negative for chest pain.  Gastrointestinal:  Positive for constipation. Negative for blood in stool, diarrhea, melena, nausea and  vomiting.      Objective:     BP 128/78 (BP Location: Left Arm, Patient Position: Sitting, Cuff Size: Normal)   Pulse 72   Temp 97.6 F (36.4 C) (Oral)   Wt 135 lb 8 oz (61.5 kg)   SpO2 97%   BMI 23.26 kg/m    Physical Exam Vitals reviewed.  Constitutional:      General: She is not in acute distress.    Appearance: She is not ill-appearing.  Cardiovascular:     Rate and Rhythm: Normal rate and regular rhythm.  Pulmonary:     Effort: Pulmonary effort is normal.     Breath sounds: Normal breath sounds.  Abdominal:     Comments: Normal bowel sounds.  Abdomen is soft with no localized tenderness.  No guarding or rebound.  Neurological:     Mental Status: She is alert.      No results found for any visits on 12/22/23.    The 10-year ASCVD risk score (Arnett DK, et al., 2019) is: 17.3%    Assessment & Plan:   Problem List Items Addressed This Visit   None Visit Diagnoses       Abdominal distension    -  Primary     Constipation, unspecified constipation type       Relevant Orders   DG Abd 1 View     Patient is seen with increased constipation symptoms past couple weeks.  She is having some low back pain  which is bilateral and questions whether this is related.  Does seem to correlate with her constipation symptoms.  No new medications.  -Handout on constipation given - Drink plenty of fluids - Increase fiber to least 25 g daily - Consider short-term trial of MiraLAX 17 g daily - X-ray 1 view abdomen but clinically no evidence for bowel obstruction - Be in touch for any persistent or worsening symptoms  No follow-ups on file.    Wolm Scarlet, MD

## 2023-12-24 ENCOUNTER — Ambulatory Visit: Payer: Self-pay | Admitting: Family Medicine

## 2024-01-03 ENCOUNTER — Ambulatory Visit (INDEPENDENT_AMBULATORY_CARE_PROVIDER_SITE_OTHER): Admitting: Family Medicine

## 2024-01-03 ENCOUNTER — Encounter: Payer: Self-pay | Admitting: Family Medicine

## 2024-01-03 VITALS — BP 116/60 | HR 66 | Temp 98.4°F | Wt 137.4 lb

## 2024-01-03 DIAGNOSIS — M545 Low back pain, unspecified: Secondary | ICD-10-CM

## 2024-01-03 DIAGNOSIS — R21 Rash and other nonspecific skin eruption: Secondary | ICD-10-CM | POA: Diagnosis not present

## 2024-01-03 DIAGNOSIS — M2550 Pain in unspecified joint: Secondary | ICD-10-CM

## 2024-01-03 MED ORDER — NYSTATIN 100000 UNIT/GM EX CREA: 1.0000 | TOPICAL_CREAM | Freq: Two times a day (BID) | CUTANEOUS | 0 refills | Status: DC

## 2024-01-03 NOTE — Progress Notes (Signed)
 Established Patient Office Visit  Subjective   Patient ID: Ann Lee, female    DOB: 04/13/1956  Age: 68 y.o. MRN: [id number]  Chief Complaint  Patient presents with   Back Pain    HPI   Ann Lee is seen today with some ongoing back pain.  She has had a lot of hand pain involving specific digits but particularly DIP and PIP joints.  Occasional MCP joint involvement as well.  She has had over 4 weeks of bilateral lumbar pain.  Denies any injury.  She is taken some Advil  with minimal relief.  No fever or weight loss.  She has also had what sounds like some intermittent hand and foot cramps.  She was concerned she may have rheumatoid arthritis.  She did have rheumatoid screen January 2024 which came back negative except for positive ANA.  She also relates skin rash just underneath left breast which is pruritic.  Slightly scaly.  She is trying to keep this unoccluded.  Past Medical History:  Diagnosis Date   HERPES ZOSTER 10/18/2008   NECK PAIN 02/15/2009   PARESTHESIA 10/30/2009   PERS HX TOBACCO USE PRESENTING HAZARDS HEALTH 03/10/2010   SINUSITIS, ACUTE 05/03/2009   SORE THROAT 02/12/2009   TMJ SYNDROME 02/11/2009   History reviewed. No pertinent surgical history.  reports that she has been smoking cigarettes. She has a 41 pack-year smoking history. She has never used smokeless tobacco. She reports that she does not drink alcohol and does not use drugs. family history includes Arthritis in her father and mother; Cancer in her father and mother; Hyperlipidemia in her father. Allergies  Allergen Reactions   Iodine Hives and Swelling   Shellfish Allergy Hives and Swelling    Review of Systems  Constitutional:  Negative for chills, fever and weight loss.  Respiratory:  Negative for cough, hemoptysis and shortness of breath.   Cardiovascular:  Negative for chest pain.  Genitourinary:  Negative for dysuria and hematuria.  Musculoskeletal:  Positive for joint pain.       Objective:     BP 116/60 (BP Location: Left Arm, Patient Position: Sitting, Cuff Size: Normal)   Pulse 66   Temp 98.4 F (36.9 C) (Oral)   Wt 137 lb 6.4 oz (62.3 kg)   SpO2 94%   BMI 23.58 kg/m  BP Readings from Last 3 Encounters:  01/03/24 116/60  12/22/23 128/78  11/30/23 120/70   Wt Readings from Last 3 Encounters:  01/03/24 137 lb 6.4 oz (62.3 kg)  12/22/23 135 lb 8 oz (61.5 kg)  11/30/23 136 lb 9.6 oz (62 kg)      Physical Exam Vitals reviewed.  Constitutional:      General: She is not in acute distress.    Appearance: She is not ill-appearing.  Cardiovascular:     Rate and Rhythm: Normal rate and regular rhythm.  Pulmonary:     Effort: Pulmonary effort is normal.     Breath sounds: Normal breath sounds.  Musculoskeletal:     Comments: No lumbar spinal tenderness.  Skin:    Comments: Slightly scaly rash underneath left breast.  Very mild erythema.  Neurological:     General: No focal deficit present.     Mental Status: She is alert.     Motor: No weakness.      No results found for any visits on 01/03/24.    The 10-year ASCVD risk score (Arnett DK, et al., 2019) is: 14.4%    Assessment & Plan:   Problem  List Items Addressed This Visit   None Visit Diagnoses       Low back pain without sciatica, unspecified back pain laterality, unspecified chronicity    -  Primary   Relevant Orders   CBC with Differential/Platelet   CMP   Sedimentation rate   C-reactive Protein   Rheumatoid Factor   Cyclic citrul peptide antibody, IgG     Polyarthralgia       Relevant Orders   CBC with Differential/Platelet   Sedimentation rate   C-reactive Protein   Rheumatoid Factor   Cyclic citrul peptide antibody, IgG     Ann Lee presents with persistent low back pain and polyarthralgias.  It sounds like she may be having some intermittent cramps involving hands and feet.  Suspect she more likely has osteoarthritis involving hands but will check markers for  inflammatory arthritis as above.  She did have recent hand x-ray which showed no erosive changes.  She also has skin rash underneath left breast which is likely Candida.  Keep area dry is much as possible.  Nystatin  cream use twice daily as needed  No follow-ups on file.    Wolm Scarlet, MD

## 2024-01-04 ENCOUNTER — Ambulatory Visit: Payer: Self-pay | Admitting: Family Medicine

## 2024-01-04 LAB — CBC WITH DIFFERENTIAL/PLATELET
Basophils Absolute: 0.1 K/uL (ref 0.0–0.1)
Basophils Relative: 1.3 % (ref 0.0–3.0)
Eosinophils Absolute: 0.2 K/uL (ref 0.0–0.7)
Eosinophils Relative: 2.8 % (ref 0.0–5.0)
HCT: 41.7 % (ref 36.0–46.0)
Hemoglobin: 13.9 g/dL (ref 12.0–15.0)
Lymphocytes Relative: 21.9 % (ref 12.0–46.0)
Lymphs Abs: 1.8 K/uL (ref 0.7–4.0)
MCHC: 33.3 g/dL (ref 30.0–36.0)
MCV: 89.9 fl (ref 78.0–100.0)
Monocytes Absolute: 0.6 K/uL (ref 0.1–1.0)
Monocytes Relative: 7.3 % (ref 3.0–12.0)
Neutro Abs: 5.4 K/uL (ref 1.4–7.7)
Neutrophils Relative %: 66.7 % (ref 43.0–77.0)
Platelets: 246 K/uL (ref 150.0–400.0)
RBC: 4.64 Mil/uL (ref 3.87–5.11)
RDW: 13.4 % (ref 11.5–15.5)
WBC: 8.2 K/uL (ref 4.0–10.5)

## 2024-01-04 LAB — SEDIMENTATION RATE: Sed Rate: 21 mm/h (ref 0–30)

## 2024-01-04 LAB — RHEUMATOID FACTOR: Rheumatoid fact SerPl-aCnc: <10 [IU]/mL (ref <14)

## 2024-01-04 LAB — COMPREHENSIVE METABOLIC PANEL WITH GFR
ALT: 9 U/L (ref 0–35)
AST: 16 U/L (ref 0–37)
Albumin: 3.9 g/dL (ref 3.5–5.2)
Alkaline Phosphatase: 77 U/L (ref 39–117)
BUN: 13 mg/dL (ref 6–23)
CO2: 29 meq/L (ref 19–32)
Calcium: 8.8 mg/dL (ref 8.4–10.5)
Chloride: 104 meq/L (ref 96–112)
Creatinine, Ser: 0.85 mg/dL (ref 0.40–1.20)
GFR: 71.3 mL/min (ref >60.00)
Glucose, Bld: 85 mg/dL (ref 70–99)
Potassium: 4.2 meq/L (ref 3.5–5.1)
Sodium: 139 meq/L (ref 135–145)
Total Bilirubin: 0.3 mg/dL (ref 0.2–1.2)
Total Protein: 6.4 g/dL (ref 6.0–8.3)

## 2024-01-04 LAB — C-REACTIVE PROTEIN: CRP: 1.4 mg/dL (ref 0.5–20.0)

## 2024-01-04 LAB — CYCLIC CITRUL PEPTIDE ANTIBODY, IGG: Cyclic Citrullin Peptide Ab: <16 U

## 2024-01-12 ENCOUNTER — Encounter: Payer: Self-pay | Admitting: Family Medicine

## 2024-01-12 DIAGNOSIS — R102 Pelvic and perineal pain: Secondary | ICD-10-CM

## 2024-01-12 DIAGNOSIS — M545 Low back pain, unspecified: Secondary | ICD-10-CM

## 2024-01-13 ENCOUNTER — Ambulatory Visit: Admitting: Sports Medicine

## 2024-01-13 VITALS — HR 87 | Ht 64.0 in | Wt 137.0 lb

## 2024-01-13 DIAGNOSIS — M25562 Pain in left knee: Secondary | ICD-10-CM

## 2024-01-13 DIAGNOSIS — M1712 Unilateral primary osteoarthritis, left knee: Secondary | ICD-10-CM

## 2024-01-13 DIAGNOSIS — M7052 Other bursitis of knee, left knee: Secondary | ICD-10-CM

## 2024-01-13 DIAGNOSIS — G8929 Other chronic pain: Secondary | ICD-10-CM | POA: Diagnosis not present

## 2024-01-13 NOTE — Patient Instructions (Signed)
 Tylenol  [phone] mg 2-3 times a day for pain relief  Voltaren gel over areas of pain  3 week follow up

## 2024-01-13 NOTE — Progress Notes (Signed)
 Ben Jules Vidovich D.[facility] [address] [place] [address] Phone: [phone]   Assessment and Plan:     1. Primary osteoarthritis of left knee 2. Chronic pain of left knee 3. Pes anserinus bursitis of left knee  -Chronic with exacerbation, subsequent visit - Continued recurrent flare of left knee pain most consistent with pes anserine bursitis.  Intra-articular CSI on 11/23/2023 likely provided relief of mild arthritis, but pain related to pes anserine bursitis has continued - Patient elected for CSI to pes anserine bursa.  Tolerated well per note below.  CSI might temporarily increase blood pressure in patient with past medical history of hypertension - Continue HEP - Use Tylenol  500 to 1000 mg tablets 2-3 times a day for day-to-day pain relief - Recommend topical Voltaren gel over areas of pain  Procedure:  Pes Bursa Injection/   Side: Left Diagnosis: Pes anserine bursitis   After explaining the procedure, viable alternatives, risks, and answering any questions, consent was given verbally. The site was cleaned with alcohol prep. A needle was introduced to the pes anserine bursa.  A steroid injection was performed  using  1ml of 1% lidocaine without epinephrine and 40 mg of triamcinolone  (KENALOG ) 40mg /ml. This was well tolerated and resulted in  relief.  Needle was removed and dressing placed and post injection instructions were given including  a discussion of likely return of pain today after the anesthetic wears off (with the possibility of worsened pain) until the steroid starts to work in 1-3 days.   Pt was advised to call or return to clinic if these symptoms worsen or fail to improve as anticipated.    Pertinent previous records reviewed include none  Follow Up: 3 weeks for reevaluation.  If no improvement or worsening of symptoms, could consider advanced imaging   Subjective:   I, Moenique Parris, am serving as a Neurosurgeon for Doctor  Morene Mace   Chief Complaint: right knee pain    HPI:    07/28/2022 Patient is a 68 year old female complaining of right knee pain. Patient states some right knee pain and swelling which started about a week ago. Denies any specific injury. She has not noted any redness but perhaps a little bit of warmth. No other acute arthralgias. Denies any history of gout or pseudogout. This occurred in the setting of recent relatively acute myalgias lower extremities bilaterally. Her legs feel heavy like she's walking through cement , she is very active, ib didn't help and made her stomach ache , no numbness or tingling    08/11/2022 Patient states that she is the same    12/15/2022 Patient states that right knee pain intermittently and usually resolves , last week left knee gave out 3 times pain was in the middle of the kneecap and was ten times worst than the right knee today she is okay   01/05/2023 Patient states she is okay she has intermittent pain    08/13/2023 Patient states that her pain came back a month a go. Last week pain increased    11/23/2023 Patient states was doing really well until Friday nigh left knee gave out on her   01/13/2024 Patient states that her left knee is still in pain CSI did not help  Relevant Historical Information:   None pertinent  Additional pertinent review of systems negative.   Current Outpatient Medications:    acetaminophen  (TYLENOL ) 500 MG tablet, Take 500 mg by mouth as needed., Disp: ,  Rfl:    amLODipine  (NORVASC ) 5 MG tablet, Take 1 tablet (5 mg total) by mouth daily., Disp: 90 tablet, Rfl: 3   amoxicillin  (AMOXIL ) 500 MG capsule, Take 1 capsule (500 mg total) by mouth 3 (three) times daily., Disp: 21 capsule, Rfl: 0   Clobetasol  Propionate 0.05 % shampoo, Apply 1 Application topically daily as needed., Disp: 118 mL, Rfl: 0   clotrimazole -betamethasone  (LOTRISONE ) cream, Apply 1 Application topically daily., Disp: 15 g, Rfl: 1   ibuprofen  (ADVIL )  200 MG tablet, Take 200 mg by mouth as needed., Disp: , Rfl:    meloxicam  (MOBIC ) 15 MG tablet, Take 1 tablet (15 mg total) by mouth daily., Disp: 30 tablet, Rfl: 0   nystatin  cream (MYCOSTATIN ), Apply 1 Application topically 2 (two) times daily., Disp: 30 g, Rfl: 0   SUMAtriptan  (IMITREX ) 25 MG tablet, TAKE ONE AT ONSET OF MIGRAINE AND MAY REPEAT ONE IN TWO HOURS AS NEEDED (MAX OF 2 IN 24 HOURS), Disp: 10 tablet, Rfl: 3   triamcinolone  cream (KENALOG ) 0.1 %, Apply 1 Application topically 2 (two) times daily., Disp: 15 g, Rfl: 0   triamcinolone  cream (KENALOG ) 0.1 %, Apply 1 Application topically 2 (two) times daily., Disp: 30 g, Rfl: 2   valACYclovir  (VALTREX ) 1000 MG tablet, Take 1 tablet (1,000 mg total) by mouth 3 (three) times daily., Disp: 21 tablet, Rfl: 0   Vitamin D , Ergocalciferol , (DRISDOL ) 1.25 MG (50000 UNIT) CAPS capsule, Take 1 capsule (50,000 Units total) by mouth every 7 (seven) days., Disp: 12 capsule, Rfl: 0   Objective:     Vitals:   01/13/24 1002  Pulse: 87  SpO2: 99%  Weight: 137 lb (62.1 kg)  Height: 5' 4 (1.626 m)      Body mass index is 23.52 kg/m.    Physical Exam:    General:  awake, alert oriented, no acute distress nontoxic Skin: no suspicious lesions or rashes Neuro:sensation intact and strength 5/5 with no deficits, no atrophy, normal muscle tone Psych: No signs of anxiety, depression or other mood disorder   Left knee: No swelling No deformity Neg fluid wave, joint milking ROM Flex 110, Ext 0 TTP significantly pes anserine bursa and tendons.  Mildly Lateral femoral condyle, lateral joint line, medial femoral condyle, medial joint line NTTP over the quad tendon,  , patella, plica, patella tendon, tibial tuberostiy, fibular head, posterior fossa,  , gerdy's tubercle,   Neg anterior and posterior drawer Neg lachman Neg sag sign Negative varus stress Negative valgus stress Negative McMurray for palpable pop Positive Thessaly   Gait normal      Electronically signed by:  Odis Mace D.[facility] 10:14 AM 01/13/24

## 2024-01-14 ENCOUNTER — Encounter: Payer: Self-pay | Admitting: Family Medicine

## 2024-01-14 ENCOUNTER — Ambulatory Visit (INDEPENDENT_AMBULATORY_CARE_PROVIDER_SITE_OTHER): Admitting: Family Medicine

## 2024-01-14 VITALS — BP 128/74 | HR 78 | Temp 98.2°F | Wt 133.6 lb

## 2024-01-14 DIAGNOSIS — R109 Unspecified abdominal pain: Secondary | ICD-10-CM | POA: Diagnosis not present

## 2024-01-14 NOTE — Progress Notes (Signed)
 Established Patient Office Visit  Subjective   Patient ID: Ann Lee, female    DOB: August 18, 1955  Age: 68 y.o. MRN: [id number]  Chief Complaint  Patient presents with   Back Pain    HPI   Ann Lee had called yesterday with some low back pain progressive since last visit.  We had instructed her to come in for lumbar films.  She denies any injury.  On further history today she is actually having more diffuse anterior abdominal pain.  This seems to radiate toward the back region.  She does have remote diagnosis of endometriosis.  Denies any recent vaginal spotting.  She does still have her uterus and both ovaries.  She feels like she has had some bloating at times lower abdominal region.  Does have some intermittent constipation and is taking Metamucil for that.  No recent nausea or vomiting.  No fever.  She had multiple recent labs including CBC and CMP which were unremarkable.  No major appetite change and weight unchanged from April.  No history of colonoscopy.  She did do Cologuard 2023 which came back negative.  She does note a little bit of urine frequency at night but no burning with urination no hematuria.  Past Medical History:  Diagnosis Date   HERPES ZOSTER 10/18/2008   NECK PAIN 02/15/2009   PARESTHESIA 10/30/2009   PERS HX TOBACCO USE PRESENTING HAZARDS HEALTH 03/10/2010   SINUSITIS, ACUTE 05/03/2009   SORE THROAT 02/12/2009   TMJ SYNDROME 02/11/2009   History reviewed. No pertinent surgical history.  reports that she has been smoking cigarettes. She has a 41 pack-year smoking history. She has never used smokeless tobacco. She reports that she does not drink alcohol and does not use drugs. family history includes Arthritis in her father and mother; Cancer in her father and mother; Hyperlipidemia in her father. Allergies  Allergen Reactions   Iodine Hives and Swelling   Shellfish Allergy Hives and Swelling    Review of Systems  Constitutional:  Negative for chills, fever  and weight loss.  Respiratory:  Negative for cough and shortness of breath.   Cardiovascular:  Negative for chest pain.  Gastrointestinal:  Positive for abdominal pain and constipation. Negative for blood in stool, diarrhea, melena, nausea and vomiting.  Genitourinary:  Negative for dysuria and hematuria.      Objective:     BP 128/74 (BP Location: Left Arm, Patient Position: Sitting, Cuff Size: Normal)   Pulse 78   Temp 98.2 F (36.8 C) (Oral)   Wt 133 lb 9.6 oz (60.6 kg)   SpO2 96%   BMI 22.93 kg/m  BP Readings from Last 3 Encounters:  01/14/24 128/74  01/03/24 116/60  12/22/23 128/78   Wt Readings from Last 3 Encounters:  01/14/24 133 lb 9.6 oz (60.6 kg)  01/13/24 137 lb (62.1 kg)  01/03/24 137 lb 6.4 oz (62.3 kg)      Physical Exam Vitals reviewed.  Constitutional:      General: She is not in acute distress.    Appearance: She is not ill-appearing.  Cardiovascular:     Rate and Rhythm: Normal rate and regular rhythm.  Pulmonary:     Effort: Pulmonary effort is normal.     Breath sounds: Normal breath sounds.  Abdominal:     General: There is no distension.     Palpations: Abdomen is soft. There is no mass.     Tenderness: There is no abdominal tenderness. There is no guarding or rebound.  Hernia: No hernia is present.  Neurological:     Mental Status: She is alert.      No results found for any visits on 01/14/24.  Last CBC Lab Results  Component Value Date   WBC 8.2 01/03/2024   HGB 13.9 01/03/2024   HCT 41.7 01/03/2024   MCV 89.9 01/03/2024   MCH 29.2 10/01/2020   RDW 13.4 01/03/2024   PLT 246.0 01/03/2024   Last metabolic panel Lab Results  Component Value Date   GLUCOSE 85 01/03/2024   NA 139 01/03/2024   K 4.2 01/03/2024   CL 104 01/03/2024   CO2 29 01/03/2024   BUN 13 01/03/2024   CREATININE 0.85 01/03/2024   GFR 71.30 01/03/2024   CALCIUM 8.8 01/03/2024   PROT 6.4 01/03/2024   ALBUMIN 3.9 01/03/2024   BILITOT 0.3 01/03/2024    ALKPHOS 77 01/03/2024   AST 16 01/03/2024   ALT 9 01/03/2024      The 10-year ASCVD risk score (Arnett DK, et al., 2019) is: 17.3%    Assessment & Plan:   Problem List Items Addressed This Visit   None Visit Diagnoses       Abdominal pain, unspecified abdominal location    -  Primary   Relevant Orders   US  Abdomen Complete     Diffuse abdominal pain bilaterally.  She has had some progressive pain for 6 weeks now.  Has had some low back pain but she thinks this is really radiating more anterior to posterior.  Denies any pain with back flexion or other movement of the low back.  We had initially plan on getting some lumbar films.  Patient is more concerned about abdominal and pelvic etiologies.  We agreed to go ahead and get repeat abdominal pelvic ultrasound to further assess.  If ultrasound negative and symptoms persist recommend GI referral  No follow-ups on file.    Wolm Scarlet, MD

## 2024-01-16 ENCOUNTER — Ambulatory Visit (HOSPITAL_BASED_OUTPATIENT_CLINIC_OR_DEPARTMENT_OTHER)
Admission: RE | Admit: 2024-01-16 | Discharge: 2024-01-16 | Disposition: A | Discharge status: Home / Self Care | Source: Ambulatory Visit | Attending: Family Medicine | Admitting: Family Medicine

## 2024-01-16 ENCOUNTER — Ambulatory Visit: Payer: Self-pay | Admitting: Family Medicine

## 2024-01-16 DIAGNOSIS — R140 Abdominal distension (gaseous): Secondary | ICD-10-CM | POA: Diagnosis not present

## 2024-01-16 DIAGNOSIS — R109 Unspecified abdominal pain: Secondary | ICD-10-CM | POA: Insufficient documentation

## 2024-01-16 DIAGNOSIS — R1084 Generalized abdominal pain: Secondary | ICD-10-CM | POA: Diagnosis not present

## 2024-01-17 NOTE — Addendum Note (Signed)
 Addended by: MICHEAL WOLM ORN on: 01/17/2024 01:07 PM   Modules accepted: Orders

## 2024-01-17 NOTE — Telephone Encounter (Signed)
 They do get a fairly good look down lower and especially the kidneys but I have added pelvic ultrasound to make sure you are getting everything  Wolm LELON Scarlet MD [facility]

## 2024-01-19 ENCOUNTER — Ambulatory Visit (HOSPITAL_BASED_OUTPATIENT_CLINIC_OR_DEPARTMENT_OTHER)
Admission: RE | Admit: 2024-01-19 | Discharge: 2024-01-19 | Disposition: A | Discharge status: Home / Self Care | Source: Ambulatory Visit | Attending: Family Medicine | Admitting: Family Medicine

## 2024-01-19 DIAGNOSIS — R102 Pelvic and perineal pain: Secondary | ICD-10-CM | POA: Insufficient documentation

## 2024-01-19 DIAGNOSIS — R140 Abdominal distension (gaseous): Secondary | ICD-10-CM | POA: Diagnosis not present

## 2024-01-21 ENCOUNTER — Encounter: Payer: Self-pay | Admitting: Gastroenterology

## 2024-01-21 ENCOUNTER — Encounter: Payer: Self-pay | Admitting: Family Medicine

## 2024-01-25 ENCOUNTER — Ambulatory Visit (HOSPITAL_BASED_OUTPATIENT_CLINIC_OR_DEPARTMENT_OTHER)
Admission: RE | Admit: 2024-01-25 | Discharge: 2024-01-25 | Disposition: A | Discharge status: Home / Self Care | Source: Ambulatory Visit | Attending: Family Medicine | Admitting: Family Medicine

## 2024-01-25 ENCOUNTER — Ambulatory Visit: Payer: Self-pay | Admitting: Family Medicine

## 2024-01-25 DIAGNOSIS — M545 Low back pain, unspecified: Secondary | ICD-10-CM | POA: Diagnosis not present

## 2024-01-25 DIAGNOSIS — M438X6 Other specified deforming dorsopathies, lumbar region: Secondary | ICD-10-CM | POA: Diagnosis not present

## 2024-01-25 DIAGNOSIS — M47816 Spondylosis without myelopathy or radiculopathy, lumbar region: Secondary | ICD-10-CM | POA: Diagnosis not present

## 2024-01-25 NOTE — Telephone Encounter (Signed)
 I spoke with Diane with the reading room and US  has been placed for STAT over-read.

## 2024-01-25 NOTE — Addendum Note (Signed)
 Addended by: MICHEAL WOLM ORN on: 01/25/2024 03:04 PM   Modules accepted: Orders

## 2024-01-25 NOTE — Telephone Encounter (Signed)
 I do recommend going ahead with lumbar x-rays.  I went ahead and placed order for these to be done to [facility] since we do not have x-ray tech here till Friday  Wolm LELON Scarlet MD [facility]

## 2024-01-27 NOTE — Telephone Encounter (Signed)
 I spoke with Gabe at the reading room and X-ray has been expedited

## 2024-01-28 DIAGNOSIS — R102 Pelvic and perineal pain: Secondary | ICD-10-CM | POA: Diagnosis not present

## 2024-01-28 DIAGNOSIS — N841 Polyp of cervix uteri: Secondary | ICD-10-CM | POA: Diagnosis not present

## 2024-01-29 ENCOUNTER — Ambulatory Visit: Payer: Self-pay | Admitting: Family Medicine

## 2024-01-31 ENCOUNTER — Ambulatory Visit (INDEPENDENT_AMBULATORY_CARE_PROVIDER_SITE_OTHER): Admitting: Family Medicine

## 2024-01-31 ENCOUNTER — Encounter: Payer: Self-pay | Admitting: Family Medicine

## 2024-01-31 VITALS — BP 126/70 | HR 58 | Temp 98.0°F | Wt 128.4 lb

## 2024-01-31 DIAGNOSIS — R109 Unspecified abdominal pain: Secondary | ICD-10-CM

## 2024-01-31 MED ORDER — TRAMADOL HCL 50 MG PO TABS: 50.0000 mg | ORAL_TABLET | Freq: Four times a day (QID) | ORAL | 0 refills | Status: DC | PRN

## 2024-01-31 NOTE — Patient Instructions (Signed)
 Let's try short term use of Tramadol  for the pain  Let me know if you have not heard back about the CT in 2-3 days.

## 2024-01-31 NOTE — Progress Notes (Signed)
 Established Patient Office Visit  Subjective   Patient ID: Ann Lee, female    DOB: Feb 08, 1956  Age: 68 y.o. MRN: [id number]  Chief Complaint  Patient presents with   Back Pain    HPI   Ann Lee has history of hypertension, migraine headaches, nicotine use, history of endometriosis.  She been seen recently with somewhat poorly localized pain lower abdomen and low back.  She states pain has been progressive.  We obtained recent labs including sed rate, C-reactive protein, CBC, CMP.  Her labs were essentially normal.  Lumbar x-ray showed some relatively mild degenerative changes but no acute findings.  She had ultrasound of abdomen and pelvis which showed no acute findings.  She continues to have pain lower back region but also lower abdominal region somewhat bilaterally.  No fever.  No stool change.  She has taken Advil  and Tylenol  without relief.  She saw her gynecologist last week and they did not feel her pain was gynecologic in nature.  Her gynecologist recommended CT abdomen pelvis which she is still waiting to hear about and concurred with GI appointment which she is waiting for in September.  She denies any radiculitis symptoms.  No urinary symptoms.  She has had some modest weight loss and that she thinks this may be potentially anxiety related.  Past Medical History:  Diagnosis Date   HERPES ZOSTER 10/18/2008   NECK PAIN 02/15/2009   PARESTHESIA 10/30/2009   PERS HX TOBACCO USE PRESENTING HAZARDS HEALTH 03/10/2010   SINUSITIS, ACUTE 05/03/2009   SORE THROAT 02/12/2009   TMJ SYNDROME 02/11/2009   History reviewed. No pertinent surgical history.  reports that she has been smoking cigarettes. She has a 41 pack-year smoking history. She has never used smokeless tobacco. She reports that she does not drink alcohol and does not use drugs. family history includes Arthritis in her father and mother; Cancer in her father and mother; Hyperlipidemia in her father. Allergies  Allergen  Reactions   Iodine Hives and Swelling   Shellfish Allergy Hives and Swelling    Review of Systems  Constitutional:  Negative for chills and fever.  Respiratory:  Negative for shortness of breath.   Cardiovascular:  Negative for chest pain.  Gastrointestinal:  Positive for abdominal pain. Negative for blood in stool, constipation, diarrhea, melena, nausea and vomiting.  Genitourinary:  Negative for dysuria, flank pain and hematuria.      Objective:     BP 126/70   Pulse (!) 58   Temp 98 F (36.7 C) (Oral)   Wt 128 lb 6.4 oz (58.2 kg)   SpO2 97%   BMI 22.04 kg/m  BP Readings from Last 3 Encounters:  01/31/24 126/70  01/14/24 128/74  01/03/24 116/60   Wt Readings from Last 3 Encounters:  01/31/24 128 lb 6.4 oz (58.2 kg)  01/14/24 133 lb 9.6 oz (60.6 kg)  01/13/24 137 lb (62.1 kg)      Physical Exam Vitals reviewed.  Constitutional:      General: She is not in acute distress.    Appearance: She is well-developed. She is not ill-appearing.  Eyes:     Pupils: Pupils are equal, round, and reactive to light.  Neck:     Thyroid : No thyromegaly.     Vascular: No JVD.  Cardiovascular:     Rate and Rhythm: Normal rate and regular rhythm.     Heart sounds:     No gallop.  Pulmonary:     Effort: Pulmonary effort is normal. No  respiratory distress.     Breath sounds: Normal breath sounds. No wheezing or rales.  Abdominal:     Comments: Abdomen nondistended.  Normal bowel sounds.  Soft with mild tenderness left lower quadrant to deep palpation.  No guarding or rebound.  Musculoskeletal:     Cervical back: Neck supple.     Comments: Straight leg raises are negative bilaterally No localized spinal tenderness  Neurological:     Mental Status: She is alert.     Comments: She has full strength with plantarflexion, dorsiflexion, knee extension bilaterally.  1+ reflexes ankle and knee bilaterally.  Normal sensory function to touch lower extremities.      No results found  for any visits on 01/31/24.  Last CBC Lab Results  Component Value Date   WBC 8.2 01/03/2024   HGB 13.9 01/03/2024   HCT 41.7 01/03/2024   MCV 89.9 01/03/2024   MCH 29.2 10/01/2020   RDW 13.4 01/03/2024   PLT 246.0 01/03/2024   Last metabolic panel Lab Results  Component Value Date   GLUCOSE 85 01/03/2024   NA 139 01/03/2024   K 4.2 01/03/2024   CL 104 01/03/2024   CO2 29 01/03/2024   BUN 13 01/03/2024   CREATININE 0.85 01/03/2024   GFR 71.30 01/03/2024   CALCIUM 8.8 01/03/2024   PROT 6.4 01/03/2024   ALBUMIN 3.9 01/03/2024   BILITOT 0.3 01/03/2024   ALKPHOS 77 01/03/2024   AST 16 01/03/2024   ALT 9 01/03/2024      The 10-year ASCVD risk score (Arnett DK, et al., 2019) is: 16.8%    Assessment & Plan:   Patient presents with several weeks of progressive somewhat poorly localized lower back and abdominal pain.  Non- acute abdomen. Recent lumbar films showed no acute findings.  Does not have any classic radiculitis features.  Abdominal pain poorly localized.  Recent pelvic and abdominal ultrasound no acute findings.  CT abdomen and pelvis reportedly being ordered by her GYN.  She has pending GI follow-up.  -We agreed to short-term trial of tramadol  50 mg 1 every 6 hours as needed for severe pain - Follow-up immediately or go to ER for any fever or worsening abdominal pain -If CT abdomen unremarkable and pain persists, consider MRI lumbar spine- but her pain seems to be predominantly abdominal.   Wolm Scarlet, MD

## 2024-02-01 ENCOUNTER — Encounter: Payer: Self-pay | Admitting: Family Medicine

## 2024-02-01 ENCOUNTER — Encounter: Payer: Self-pay | Admitting: Obstetrics & Gynecology

## 2024-02-01 ENCOUNTER — Ambulatory Visit
Admission: RE | Admit: 2024-02-01 | Discharge: 2024-02-01 | Disposition: A | Discharge status: Home / Self Care | Source: Ambulatory Visit | Attending: Obstetrics & Gynecology | Admitting: Obstetrics & Gynecology

## 2024-02-01 ENCOUNTER — Other Ambulatory Visit: Payer: Self-pay | Admitting: Obstetrics & Gynecology

## 2024-02-01 DIAGNOSIS — R102 Pelvic and perineal pain: Secondary | ICD-10-CM

## 2024-02-02 ENCOUNTER — Encounter: Payer: Self-pay | Admitting: Family Medicine

## 2024-02-02 ENCOUNTER — Ambulatory Visit: Admitting: Family Medicine

## 2024-02-02 VITALS — BP 146/82 | HR 79 | Temp 97.7°F | Wt 129.0 lb

## 2024-02-02 DIAGNOSIS — R935 Abnormal findings on diagnostic imaging of other abdominal regions, including retroperitoneum: Secondary | ICD-10-CM

## 2024-02-02 DIAGNOSIS — M545 Low back pain, unspecified: Secondary | ICD-10-CM

## 2024-02-02 DIAGNOSIS — N2889 Other specified disorders of kidney and ureter: Secondary | ICD-10-CM | POA: Diagnosis not present

## 2024-02-02 DIAGNOSIS — R590 Localized enlarged lymph nodes: Secondary | ICD-10-CM

## 2024-02-02 DIAGNOSIS — R8271 Bacteriuria: Secondary | ICD-10-CM | POA: Diagnosis not present

## 2024-02-02 DIAGNOSIS — R1900 Intra-abdominal and pelvic swelling, mass and lump, unspecified site: Secondary | ICD-10-CM | POA: Diagnosis not present

## 2024-02-02 NOTE — Progress Notes (Unsigned)
 Established Patient Office Visit  Subjective   Patient ID: Ann Lee, female    DOB: [birth year]  Age: 68 y.o. MRN: [id number]  Chief Complaint  Patient presents with   Medical Management of Chronic Issues    HPI  {History (Optional):23778} Ann Lee is seen for follow-up regarding recent abnormal CT scan.  She has had multiple recent visits for poorly localized abdominal pain starting back July 9.  She had lab work including C-reactive protein, sed rate, CBC, comprehensive metabolic panel which were unremarkable.  Ultrasound abdomen and pelvis revealed no acute findings.  Ultrasound August 3 did reveal right pelvocalyectasis containing mildly complex fluid .  Her pelvic ultrasound did not show any adnexal masses or uterine masses.  She saw her gynecologist and they ordered a CT scan with results as below.  Mild right sided hydroureteronephrosis extending to the mid to distal ureter.  No obstructive renal calculi or uterine calculi seen.  There is also comment of ovoid hyperdensity along the lower pole of the right kidney measuring 1.8 cm possibly representing blood within the renal calyx versus renal or urothelial neoplasm.  Recommendation for CT urogram.  CT also showed multiple enlarged retroperitoneal lymph nodes.  Patient denies any gross hematuria.  No fever.  She has not noted any adenopathy in her neck, axillary region, or otherwise.  Narrative & Impression  CLINICAL DATA:  Severe lower back and pelvic pain. Changes in size and in shape of BM.   EXAM: CT ABDOMEN AND PELVIS WITHOUT CONTRAST   TECHNIQUE: Multidetector CT imaging of the abdomen and pelvis was performed following the standard protocol without IV contrast.   RADIATION DOSE REDUCTION: This exam was performed according to the departmental dose-optimization program which includes automated exposure control, adjustment of the mA and/or kV according to patient size and/or use of iterative reconstruction  technique.   COMPARISON:  January 16, 2024   FINDINGS: Of note, the lack of intravenous contrast limits evaluation of the solid organ parenchyma and vascularity.   Lower chest: No focal airspace consolidation or pleural effusion.   Hepatobiliary: No mass.Focal fatty infiltration along the falciform ligament.No radiopaque stones or wall thickening of the gallbladder. No intrahepatic or extrahepatic biliary ductal dilation.   Pancreas: No mass or main ductal dilation. No peripancreatic inflammation or fluid collection.   Spleen: Normal size. No mass.   Adrenals/Urinary Tract: No adrenal masses. Ovoid hyperdensity along the right lower pole of the kidney measuring 1.8 cm in transverse dimension (axial 46). Mild right-sided hydronephrosis extending to the mid to distal ureter. No obstructive ureteral calculi present, but perinephric and periureteric stranding is present. The urinary bladder is distended without focal abnormality.   Stomach/Bowel: The stomach is decompressed without focal abnormality. No small bowel wall thickening or inflammation. No small bowel obstruction.No extravasation of enteric contrast to suggest bowel perforation. The enteric contrast is present to the level of the distal sigmoid colon.Normal appendix. Sigmoid colonic diverticulosis. No changes of acute diverticulitis.   Vascular/Lymphatic: No aortic aneurysm. Diffuse aortoiliac atherosclerosis. Multiple enlarged retroperitoneal lymph nodes involving the periaortic and aortocaval spaces. For example, there is an enlarged lymph node in the anterior aortocaval region measuring 1.3 cm (axial 34). A second aortocaval periaortic lymph node measures 1.5 cm (axial 40). 1.2 cm periaortic lymph node (axial 36). The remainder of the periaortic lymph nodes measure up to 9 mm.   Reproductive: Age-related atrophy of the uterus and ovaries. No concerning adnexal mass. No free pelvic fluid.   Other: No  pneumoperitoneum or  ascites.   Musculoskeletal: No acute fracture or destructive lesion. Multilevel degenerative disc disease of the spine.   IMPRESSION: 1. Mild right-sided hydroureteronephrosis extending to the mid to distal ureter. No obstructive ureteral calculi present. This could represent changes from a recently passed calculus versus an ascending urinary tract infection. Correlation with urinalysis is recommended. 2. Multiple enlarged retroperitoneal lymph nodes involving the periaortic and aortocaval spaces. For example, the largest lymph node measures 1.5 cm in short axis dimension (axial 40). These are worrisome for either metastatic disease or lymphoma. 3. Ovoid hyperdensity along the lower pole of the right kidney, measuring 1.8 cm, possibly representing blood within a renal calyx versus a renal or urothelial neoplasm. CT urogram recommended.   These results will be called to the ordering clinician or representative by the Radiologist Assistant and communication documented in the PACS or [employer].   Aortic Atherosclerosis (ICD10-I70.0    Past Medical History:  Diagnosis Date   HERPES ZOSTER 10/18/2008   NECK PAIN 02/15/2009   PARESTHESIA 10/30/2009   PERS HX TOBACCO USE PRESENTING HAZARDS HEALTH 03/10/2010   SINUSITIS, ACUTE 05/03/2009   SORE THROAT 02/12/2009   TMJ SYNDROME 02/11/2009   History reviewed. No pertinent surgical history.  reports that she has been smoking cigarettes. She has a 41 pack-year smoking history. She has never used smokeless tobacco. She reports that she does not drink alcohol and does not use drugs. family history includes Arthritis in her father and mother; Cancer in her father and mother; Hyperlipidemia in her father. Allergies  Allergen Reactions   Iodine Hives and Swelling   Shellfish Allergy Hives and Swelling    Review of Systems  Constitutional:  Negative for chills and fever.  Respiratory:  Negative for shortness of breath.    Cardiovascular:  Negative for chest pain.  Gastrointestinal:  Positive for abdominal pain. Negative for blood in stool, nausea and vomiting.  Genitourinary:  Negative for hematuria.      Objective:     BP (!) 146/82   Pulse 79   Temp 97.7 F (36.5 C) (Oral)   Wt 129 lb (58.5 kg)   SpO2 97%   BMI 22.14 kg/m  BP Readings from Last 3 Encounters:  02/02/24 (!) 146/82  01/31/24 126/70  01/14/24 128/74   Wt Readings from Last 3 Encounters:  02/02/24 129 lb (58.5 kg)  01/31/24 128 lb 6.4 oz (58.2 kg)  01/14/24 133 lb 9.6 oz (60.6 kg)      Physical Exam Vitals reviewed.  Constitutional:      General: She is not in acute distress. Neck:     Comments: No supraclavicular adenopathy. Cardiovascular:     Rate and Rhythm: Normal rate and regular rhythm.  Pulmonary:     Effort: Pulmonary effort is normal.     Breath sounds: Normal breath sounds.  Musculoskeletal:     Cervical back: Neck supple.  Lymphadenopathy:     Cervical: No cervical adenopathy.  Neurological:     Mental Status: She is alert.      No results found for any visits on 02/02/24.  {Labs (Optional):23779}  The 10-year ASCVD risk score (Arnett DK, et al., 2019) is: 21.9%    Assessment & Plan:   Patient presents with several week history of somewhat poorly localized abdominal and back pain.  Recent CT imaging with multiple abnormalities including multiple retroperitoneal lymph nodes, mild right-sided hydroureteronephrosis, ovoid hyperdensity lower pole right kidney.  -Discussed results with radiologist. - Set up CT urogram to further assess -  Discussed feasibility of lymph node biopsy per interventional radiology and given location this would be difficult. - If above radiographic procedure unrevealing consider PET CT scan to further evaluate lymphadenopathy - Obtain urinalysis today with reflex microscopy and culture  No follow-ups on file.    Wolm Scarlet, MD

## 2024-02-03 ENCOUNTER — Ambulatory Visit: Payer: Self-pay | Admitting: Family Medicine

## 2024-02-03 MED ORDER — CEPHALEXIN 500 MG PO CAPS: 500.0000 mg | ORAL_CAPSULE | Freq: Three times a day (TID) | ORAL | 0 refills | Status: AC

## 2024-02-04 ENCOUNTER — Encounter: Payer: Self-pay | Admitting: Gastroenterology

## 2024-02-04 ENCOUNTER — Ambulatory Visit (INDEPENDENT_AMBULATORY_CARE_PROVIDER_SITE_OTHER): Admitting: Gastroenterology

## 2024-02-04 ENCOUNTER — Ambulatory Visit: Admitting: Sports Medicine

## 2024-02-04 VITALS — BP 128/80 | HR 88 | Ht 64.0 in | Wt 130.0 lb

## 2024-02-04 DIAGNOSIS — R194 Change in bowel habit: Secondary | ICD-10-CM

## 2024-02-04 DIAGNOSIS — Z800 Family history of malignant neoplasm of digestive organs: Secondary | ICD-10-CM | POA: Diagnosis not present

## 2024-02-04 DIAGNOSIS — M545 Low back pain, unspecified: Secondary | ICD-10-CM

## 2024-02-04 DIAGNOSIS — R634 Abnormal weight loss: Secondary | ICD-10-CM

## 2024-02-04 DIAGNOSIS — R1030 Lower abdominal pain, unspecified: Secondary | ICD-10-CM

## 2024-02-04 LAB — URINALYSIS W MICROSCOPIC + REFLEX CULTURE
Bilirubin Urine: NEGATIVE
Glucose, UA: NEGATIVE
Ketones, ur: NEGATIVE
Nitrites, Initial: NEGATIVE
Specific Gravity, Urine: 1.017 (ref 1.001–1.035)
WBC, UA: >=60 /HPF — AB (ref 0–5)
pH: 6 (ref 5.0–8.0)

## 2024-02-04 LAB — URINE CULTURE
MICRO NUMBER:: 16861062
Result:: NO GROWTH
SPECIMEN QUALITY:: ADEQUATE

## 2024-02-04 LAB — CULTURE INDICATED

## 2024-02-04 NOTE — Progress Notes (Signed)
 Attending Physician's Attestation   I have reviewed the chart.   I agree with the Advanced Practitioner's note, impression, and recommendations with any updates as below. Diagnostic colonoscopy is reasonable approach to changes in her bowel habits.  Recommend this occurs in next 4-8 weeks (pending her urological workup does not preclude her ability to have procedures otherwise.  Even if there is concern for urologic malignancy, I think it is OK to proceed with colonoscopic evaluation.   Aloha Finner, MD [facility] Advanced [facility]oscopy Office # [phone]

## 2024-02-04 NOTE — Progress Notes (Signed)
 Ann Lee [id number] Aug 16, 1955   Chief Complaint: Abdominal pain, change in bowel habits  Referring Provider: Micheal Wolm ORN, MD Primary GI MD: Sampson  HPI: Ann Lee is a 68 y.o. female with past medical history of TMJ disorder, migraines, AAA, HTN, current smoker with 41 pack year history, skin cancer who presents today for a complaint of abdominal pain.    Seen by PCP 01/31/2024 with several weeks of progressive, somewhat poorly localized lower back and abdominal pain.  Recent pelvic and abdominal ultrasound with no acute findings.  CT A/P ordered by gynecology.  PCP put her on short-term trial of tramadol  as needed for severe pain with ED precautions.  Pain seems to be predominantly abdominal but there is consideration for MRI lumbar spine if needed.  CT A/P 02/01/2024 showed mild right-sided hydroureteronephrosis, possibly representing changes from a recently passed calculus versus ascending UTI.  There was also a ovoid hyperdensity along the lower pole of the right kidney measuring 1.8 cm, possibly representing blood within the renal calyx versus a renal or urothelial neoplasm.  CT urogram is recommended.  There were multiple enlarged retroperitoneal lymph nodes involving the periaortic and aortocaval spaces with largest lymph node measuring 1.5 cm, and worrisome for either metastatic disease or lymphoma.  Seen in follow-up by PCP 02/02/2024.  CT urogram has been ordered as stat/urgent.  Would be difficult to biopsy lymph node based on location per interventional radiology.  If radiographic procedure unrevealing we will consider PET CT scan to further evaluate lymphadenopathy.  Urinalysis showed significant RBCs and WBCs and she has been started on Keflex .   Patient states she has had lower abdominal pain as well as low back pain ongoing for about 8 weeks.  Lower abdominal pain feels like soreness.  States when it first started it felt like she was going to start her  menstrual period.  This went on for a week and she had no improvement with application of ice, heat, or with taking Advil , so she went to see her PCP for further evaluation, and has had workup as above.  She started Keflex  yesterday, has 3 more days to complete.  So far has not had any improvement in her pain.  She denies history of kidney stones. States she has a CT urogram scheduled for Sunday.  Since onset of pain, she has also noted a change in her bowel movements.  She denies any constipation or diarrhea, but her bowel movements are just different and states that she is passing what looks like torn pieces of stool rather than whole pieces of stool.  She denies seeing any blood in her stool or melena.  Denies any rectal pain with bowel movements.  She does endorse that she is having to strain with bowel movements and this is also new for her.  She tried Metamucil and this did not make any difference.  She is alternating between Advil  and Tylenol  which dulls her abdominal/back pain but does not take it away.  She thinks that having a bowel movement may relieve some of her lower abdominal heaviness but does not completely resolve pain.  She denies any upper abdominal pain, nausea, vomiting, acid reflux, heartburn, dysphagia.  She does have some bloating and increased gas/belching.  Also states she has had unintentional weight loss of about 7 pounds since all of this began.  She denies any changes in diet.  States she drinks plenty of water.  Did start a new blood pressure medication in April/May, symptoms  began in June.  Patient's father had colon cancer diagnosed age 31/82.  He is still alive and age 55.  She denies any other family history of colon cancer or colon polyps.  Patient has had a negative Cologuard test 04/2022.  She has never had a colonoscopy or upper endoscopy.  Her mother had esophageal cancer diagnosed in her 43s, was diagnosed with non-Hodgkin's lymphoma in her 70s, lived to  be 6. Patient's sister had lung cancer and has since passed.  She denies any history of MI/stroke.  States she has annual lung cancer screening due to her smoking history but denies any ongoing lung problems and does not see a pulmonologist (most recent chest CT 03/2023 did show evidence of COPD).  Denies any chest pain or shortness of breath.  Previous GI Procedures/Imaging   CT A/P 02/01/2024 1. Mild right-sided hydroureteronephrosis extending to the mid to distal ureter. No obstructive ureteral calculi present. This could represent changes from a recently passed calculus versus an ascending urinary tract infection. Correlation with urinalysis is recommended. 2. Multiple enlarged retroperitoneal lymph nodes involving the periaortic and aortocaval spaces. For example, the largest lymph node measures 1.5 cm in short axis dimension (axial 40). These are worrisome for either metastatic disease or lymphoma. 3. Ovoid hyperdensity along the lower pole of the right kidney, measuring 1.8 cm, possibly representing blood within a renal calyx versus a renal or urothelial neoplasm. CT urogram recommended.  Pelvic ultrasound 01/19/2024 1. Normal appearance of the uterus and right ovary. 2. Nonvisualization of the left ovary. No adnexal mass.  Abdominal ultrasound 01/16/2024 1. Right pelvocaliectasis containing mildly complex fluid. Correlate for any clinical signs or symptoms of right-sided pyelonephritis. 2. Atherosclerotic changes in the kidneys.  Abdominal x-ray 12/22/2023 Mild stool burden. No abnormal bowel dilatation.   Past Medical History:  Diagnosis Date   HERPES ZOSTER 10/18/2008   NECK PAIN 02/15/2009   PARESTHESIA 10/30/2009   PERS HX TOBACCO USE PRESENTING HAZARDS HEALTH 03/10/2010   SINUSITIS, ACUTE 05/03/2009   SORE THROAT 02/12/2009   TMJ SYNDROME 02/11/2009    Past Surgical History:  Procedure Laterality Date   DILATION AND CURETTAGE OF UTERUS     HERNIA REPAIR     WISDOM TOOTH  EXTRACTION      Current Outpatient Medications  Medication Sig Dispense Refill   acetaminophen  (TYLENOL ) 500 MG tablet Take 500 mg by mouth as needed.     cephALEXin  (KEFLEX ) 500 MG capsule Take 1 capsule (500 mg total) by mouth 3 (three) times daily for 5 days. 15 capsule 0   ibuprofen  (ADVIL ) 200 MG tablet Take 200 mg by mouth as needed.     nystatin  cream (MYCOSTATIN ) Apply 1 Application topically 2 (two) times daily. 30 g 0   SUMAtriptan  (IMITREX ) 25 MG tablet TAKE ONE AT ONSET OF MIGRAINE AND MAY REPEAT ONE IN TWO HOURS AS NEEDED (MAX OF 2 IN 24 HOURS) 10 tablet 3   amLODipine  (NORVASC ) 5 MG tablet Take 1 tablet (5 mg total) by mouth daily. (Patient not taking: Reported on 02/04/2024) 90 tablet 3   meloxicam  (MOBIC ) 15 MG tablet Take 1 tablet (15 mg total) by mouth daily. (Patient not taking: Reported on 02/04/2024) 30 tablet 0   No current facility-administered medications for this visit.    Allergies as of 02/04/2024 - Review Complete 02/04/2024  Allergen Reaction Noted   Iodine Hives and Swelling 10/18/2008   Shellfish allergy Hives and Swelling 10/24/2011    Family History  Problem Relation Age of  Onset   Arthritis Mother    Non-Hodgkin's lymphoma Mother    Esophageal cancer Mother    Stroke Mother    Hyperlipidemia Father    Arthritis Father    Colon cancer Father    Lung cancer Sister     Social History   Tobacco Use   Smoking status: Every Day    Current packs/day: 1.00    Average packs/day: 1 pack/day for 41.0 years (41.0 ttl pk-yrs)    Types: Cigarettes   Smokeless tobacco: Never   Tobacco comments:    She and sister are going to work on quitting together  Vaping Use   Vaping status: Never Used  Substance Use Topics   Alcohol use: No    Alcohol/week: 0.0 standard drinks of alcohol   Drug use: No     Review of Systems:    Constitutional: Positive unintentional weight loss, no fever/chills Cardiovascular: No chest pain Respiratory: No SOB   Gastrointestinal: See HPI and otherwise negative Hematologic: No bleeding    Physical Exam:  Vital signs: BP 128/80   Pulse 88   Ht 5' 4 (1.626 m)   Wt 130 lb (59 kg)   SpO2 100%   BMI 22.31 kg/m   Constitutional: Pleasant female in NAD, alert and cooperative Head:  Normocephalic and atraumatic.  Eyes: No scleral icterus.  Respiratory: Respirations even and unlabored. Lungs clear to auscultation bilaterally.  No wheezes, crackles, or rhonchi.  Cardiovascular:  Regular rate and rhythm. No murmurs. No peripheral edema. Gastrointestinal:  Soft, nondistended, nontender. No rebound or guarding. Normal bowel sounds. No appreciable masses or hepatomegaly. Rectal:  Not performed.  Neurologic:  Alert and oriented x4;  grossly normal neurologically.  Skin:   Dry and intact without significant lesions or rashes. Psychiatric: Oriented to person, place and time. Demonstrates good judgement and reason without abnormal affect or behaviors.   RELEVANT LABS AND IMAGING: CBC    Component Value Date/Time   WBC 8.2 01/03/2024 1613   RBC 4.64 01/03/2024 1613   HGB 13.9 01/03/2024 1613   HCT 41.7 01/03/2024 1613   PLT 246.0 01/03/2024 1613   MCV 89.9 01/03/2024 1613   MCH 29.2 10/01/2020 0837   MCHC 33.3 01/03/2024 1613   RDW 13.4 01/03/2024 1613   LYMPHSABS 1.8 01/03/2024 1613   MONOABS 0.6 01/03/2024 1613   EOSABS 0.2 01/03/2024 1613   BASOSABS 0.1 01/03/2024 1613    CMP     Component Value Date/Time   NA 139 01/03/2024 1613   K 4.2 01/03/2024 1613   CL 104 01/03/2024 1613   CO2 29 01/03/2024 1613   GLUCOSE 85 01/03/2024 1613   BUN 13 01/03/2024 1613   CREATININE 0.85 01/03/2024 1613   CREATININE 0.87 05/30/2020 0858   CALCIUM 8.8 01/03/2024 1613   PROT 6.4 01/03/2024 1613   ALBUMIN 3.9 01/03/2024 1613   AST 16 01/03/2024 1613   ALT 9 01/03/2024 1613   ALKPHOS 77 01/03/2024 1613   BILITOT 0.3 01/03/2024 1613   GFRNONAA 93.61 10/29/2008 1044   GFRAA  03/15/2008 1000     >60        The eGFR has been calculated using the MDRD equation. This calculation has not been validated in all clinical     Assessment/Plan:   Change in bowel habits Lower abdominal pain/low back pain Family history of colon cancer (father, in his 43s) Unintentional weight loss Abnormal findings on CT of abdomen and pelvis Patient seen today for evaluation of lower abdominal pain and  change in bowel habits.  Has been having lower abdominal pain/soreness as well as low back pain for about 8 weeks.  After 1 week of pain without improvement taking Advil  or applying heat/ice, she contacted her PCP and has since had extensive workup (see HPI for more detail). She endorses change in her bowel habits and has noticed that she is having to strain with bowel movements and having stool pass in smaller pieces rather than larger pieces as they used to, but she does not feel constipated.  She has had increased abdominal bloating.  May have some relief of heaviness in her lower abdomen with a bowel movement but pain persists.  Denies any rectal bleeding or melena.  She has had multiple imaging studies including CT A/P 02/01/2024 which showed mild right-sided hydroureteronephrosis, possibly representing changes from a recently passed calculus versus ascending UTI.  There was also a ovoid hyperdensity along the lower pole of the right kidney measuring 1.8 cm, possibly representing blood within the renal calyx versus a renal or urothelial neoplasm.  CT urogram was recommended.  There were multiple enlarged retroperitoneal lymph nodes involving the periaortic and aortocaval spaces with largest lymph node measuring 1.5 cm, and worrisome for either metastatic disease or lymphoma. She is scheduled for CT urogram on Sunday. Was started on Keflex  for possible UTI and is on day 2 of treatment.  Denies any improvement in her symptoms since starting this.  She has family history significant for colon cancer in her father  who was diagnosed in his 70s.  Her mother had esophageal cancer diagnosed in her 31s.  Patient has never had an upper endoscopy or colonoscopy, but she did have a negative Cologuard test in 2023.  She is concerned about the change in her bowel habits as well as pain in her lower abdomen and would like to have a colonoscopy for further evaluation.  While I do feel this is warranted, I will will discuss with MD regarding timing and location of procedure given other concerning findings on recent CT scan.  Her renal function looks good on recent labs.  - Discuss with MD regarding colonoscopy and will call patient to schedule procedure as indicated.   Camie Furbish, PA-C [facility] 02/04/2024, 2:34 PM  Patient Care Team: Micheal Wolm ORN, MD as PCP - General

## 2024-02-04 NOTE — Patient Instructions (Addendum)
 We will contact you regarding scheduling a colonoscopy.   _______________________________________________________  If your blood pressure at your visit was 140/90 or greater, please contact your primary care physician to follow up on this.  _______________________________________________________  If you are age 67 or older, your body mass index should be between 23-30. Your Body mass index is 22.31 kg/m. If this is out of the aforementioned range listed, please consider follow up with your Primary Care Provider.  If you are age 22 or younger, your body mass index should be between 19-25. Your Body mass index is 22.31 kg/m. If this is out of the aformentioned range listed, please consider follow up with your Primary Care Provider.   ________________________________________________________  The [facility] providers would like to encourage you to use MYCHART to communicate with providers for non-urgent requests or questions.  Due to long hold times on the telephone, sending your provider a message by [facility] may be a faster and more efficient way to get a response.  Please allow 48 business hours for a response.  Please remember that this is for non-urgent requests.  _______________________________________________________  [facility] is using a team-based approach to care.  Your team is made up of your doctor and two to three APPS. Our APPS (Nurse Practitioners and Physician Assistants) work with your physician to ensure care continuity for you. They are fully qualified to address your health concerns and develop a treatment plan. They communicate directly with your gastroenterologist to care for you. Seeing the Advanced Practice Practitioners on your physician's team can help you by facilitating care more promptly, often allowing for earlier appointments, access to diagnostic testing, procedures, and other specialty referrals.    Thank you for trusting me with your gastrointestinal  care!    Camie Furbish, PA-C [facility]

## 2024-02-06 ENCOUNTER — Ambulatory Visit (HOSPITAL_BASED_OUTPATIENT_CLINIC_OR_DEPARTMENT_OTHER)
Admission: RE | Admit: 2024-02-06 | Discharge: 2024-02-06 | Disposition: A | Discharge status: Home / Self Care | Source: Ambulatory Visit | Attending: Family Medicine | Admitting: Family Medicine

## 2024-02-06 ENCOUNTER — Encounter: Payer: Self-pay | Admitting: Family Medicine

## 2024-02-06 DIAGNOSIS — N2889 Other specified disorders of kidney and ureter: Secondary | ICD-10-CM

## 2024-02-07 ENCOUNTER — Telehealth: Payer: Self-pay

## 2024-02-07 ENCOUNTER — Telehealth: Payer: Self-pay | Admitting: Gastroenterology

## 2024-02-07 DIAGNOSIS — R140 Abdominal distension (gaseous): Secondary | ICD-10-CM

## 2024-02-07 DIAGNOSIS — M545 Low back pain, unspecified: Secondary | ICD-10-CM

## 2024-02-07 DIAGNOSIS — R1030 Lower abdominal pain, unspecified: Secondary | ICD-10-CM

## 2024-02-07 DIAGNOSIS — R634 Abnormal weight loss: Secondary | ICD-10-CM

## 2024-02-07 DIAGNOSIS — Z800 Family history of malignant neoplasm of digestive organs: Secondary | ICD-10-CM

## 2024-02-07 DIAGNOSIS — R194 Change in bowel habit: Secondary | ICD-10-CM

## 2024-02-07 MED ORDER — PREDNISONE 50 MG PO TABS: ORAL_TABLET | ORAL | 0 refills | Status: DC

## 2024-02-07 NOTE — Telephone Encounter (Signed)
 Please let patient know I have discussed with Dr. Wilhelmenia and he is in agreement with scheduling colonoscopy and has recommended we do this in the next 4-8 weeks.  If patient is agreeable, can go ahead and order/schedule colonoscopy for the following indications: Change in bowel habits Lower abdominal pain Abdominal bloating Low back pain Family history of colon cancer - father Unintentional weight loss

## 2024-02-07 NOTE — Telephone Encounter (Signed)
 Copied from CRM #[id number]. Topic: Clinical - Medication Question >> Feb 07, 2024  8:41 AM Emylou G wrote: Reason for CRM: Patient went on her scan Sunday.. the order wasn't put in for her medication (prednisone  and benadryl) so now she has to wait for her scan. needs Dr Tari nurse to call her. ** needs the med by 12 so she can get the scan done today **

## 2024-02-07 NOTE — Telephone Encounter (Signed)
 I spoke with Ann Lee with radiology and she reported that they have no protocols for imaging since patient is not admitted from ED and the procedure is not impatient. I spoke with the patient and she reported radiology wanted her to take Prednisone  12 hours prior, 7 hours prior and 1 hour prior ok to send updated prescription refecting this?

## 2024-02-07 NOTE — Telephone Encounter (Signed)
 Pt walk in the office and has ct renal sch for tomorrow and need advise

## 2024-02-07 NOTE — Addendum Note (Signed)
 Addended by: METTA KRISTEN CROME on: 02/07/2024 01:28 PM   Modules accepted: Orders

## 2024-02-07 NOTE — Telephone Encounter (Signed)
 Noted

## 2024-02-07 NOTE — Addendum Note (Signed)
 Addended by: METTA KRISTEN CROME on: 02/07/2024 02:02 PM   Modules accepted: Orders

## 2024-02-07 NOTE — Addendum Note (Signed)
 Addended by: METTA KRISTEN CROME on: 02/07/2024 01:40 PM   Modules accepted: Orders

## 2024-02-07 NOTE — Telephone Encounter (Signed)
 Rx sent and patient aware of message below

## 2024-02-08 ENCOUNTER — Ambulatory Visit (HOSPITAL_BASED_OUTPATIENT_CLINIC_OR_DEPARTMENT_OTHER)
Admission: RE | Admit: 2024-02-08 | Discharge: 2024-02-08 | Disposition: A | Discharge status: Home / Self Care | Source: Ambulatory Visit | Attending: Family Medicine | Admitting: Family Medicine

## 2024-02-08 DIAGNOSIS — N2889 Other specified disorders of kidney and ureter: Secondary | ICD-10-CM | POA: Insufficient documentation

## 2024-02-08 DIAGNOSIS — N134 Hydroureter: Secondary | ICD-10-CM | POA: Diagnosis not present

## 2024-02-08 DIAGNOSIS — N1330 Unspecified hydronephrosis: Secondary | ICD-10-CM | POA: Diagnosis not present

## 2024-02-08 DIAGNOSIS — N281 Cyst of kidney, acquired: Secondary | ICD-10-CM | POA: Diagnosis not present

## 2024-02-08 DIAGNOSIS — R1903 Right lower quadrant abdominal swelling, mass and lump: Secondary | ICD-10-CM | POA: Diagnosis not present

## 2024-02-08 MED ORDER — NA SULFATE-K SULFATE-MG SULF 17.5-3.13-1.6 GM/177ML PO SOLN
1.0000 | Freq: Once | ORAL | 0 refills | Status: AC
Start: 2024-02-08 — End: 2024-02-08

## 2024-02-08 MED ORDER — IOHEXOL 300 MG/ML  SOLN
100.0000 mL | Freq: Once | INTRAMUSCULAR | Status: AC | PRN
  Administered 2024-02-08: 100 mL via INTRAVENOUS

## 2024-02-08 NOTE — Telephone Encounter (Signed)
 Referral placed, prep sent to local pharmacy, instructions sent via mychart portal. Pt informed and went over instructions with pt. Advised to call if she has any questions. Pt states understanding.

## 2024-02-09 ENCOUNTER — Ambulatory Visit (INDEPENDENT_AMBULATORY_CARE_PROVIDER_SITE_OTHER): Admitting: Family Medicine

## 2024-02-09 ENCOUNTER — Encounter: Payer: Self-pay | Admitting: Family Medicine

## 2024-02-09 VITALS — BP 108/70 | HR 97 | Temp 97.8°F | Wt 129.2 lb

## 2024-02-09 DIAGNOSIS — N2889 Other specified disorders of kidney and ureter: Secondary | ICD-10-CM | POA: Diagnosis not present

## 2024-02-09 NOTE — Progress Notes (Signed)
 Established Patient Office Visit  Subjective   Patient ID: Ann Lee, female    DOB: [birth year]  Age: 68 y.o. MRN: [id number]  Chief Complaint  Patient presents with   Results    HPI   Ann Lee is seen to go over her recent imaging to further evaluate renal mass.  This was performed yesterday.  This confirmed 1.6 x 1.7 x 2.0 cm hyperattenuating moderately enhancing mass right kidney lower pole calyx felt to be compatible with urothelial malignancy.  There was also comment of some moderate right hydronephrosis and hydroureter.  Also comment of extensive heterogenous retroperitoneal lymphadenopathy, compatible with metastases .  There was a linear bandlike enhancing area right upper ureter concerning for urothelial malignancy.  Patient had recent urinalysis with significant leukocytosis and many bacteria.  Culture came back negative.  Was treated empirically with Keflex .  Denies any urinary symptoms at this time.  No gross hematuria.  She is continue to have some vague back pains.  Poor appetite.  She has had some recent weight loss.  Narrative & Impression  CLINICAL DATA:  Renal mass/cyst, indeterminate. * Tracking Code: BO *   EXAM: CT ABDOMEN WITHOUT AND WITH CONTRAST   TECHNIQUE: Multidetector CT imaging of the abdomen was performed following the standard protocol before and following the bolus administration of intravenous contrast.   RADIATION DOSE REDUCTION: This exam was performed according to the departmental dose-optimization program which includes automated exposure control, adjustment of the mA and/or kV according to patient size and/or use of iterative reconstruction technique.   CONTRAST:  OMNIPAQUE  IOHEXOL  300 MG/ML  SOLN   COMPARISON:  CT scan abdomen and pelvis from 02/01/2024.   FINDINGS: Lower chest: The lung bases are clear. No pleural effusion. The heart is normal in size. No pericardial effusion.   Hepatobiliary: The liver is normal in  size. Non-cirrhotic configuration. No suspicious mass. No intrahepatic or extrahepatic bile duct dilation. No calcified gallstones. Normal gallbladder wall thickness. No pericholecystic inflammatory changes.   Pancreas: Unremarkable. No pancreatic ductal dilatation or surrounding inflammatory changes.   Spleen: Within normal limits. No focal lesion.   Adrenals/Urinary Tract: Adrenal glands are unremarkable.   Non-contrast images: No radiopaque urinary tract calculi. There is a 1.6 x 1.7 x 2.0 cm hyperattenuating mass in the right kidney lower pole calyx which exhibits moderate enhancement on the post-contrast images and is compatible with urothelial malignancy. There is subtle extension of this tumor into the anteroinferior wall of the right renal pelvis (series 6, image 74). No other suspicious renal lesion seen on either side. No nephroureterolithiasis on either side.   Kidneys: Symmetric enhancement.   Urinary Tract Opacification: Suboptimal.   Collecting Systems and Ureters: There is moderate right hydronephrosis and hydroureter up to the level of right mid ureter (series 12, image 44), which exhibit abrupt narrowing of the diameter and irregular enhancing walls and mild to moderate periureteric fat stranding. There is also a linear bandlike enhancing area in the right upper ureter (series 10, image 102 and series 14, image 69). Please note right mid lower ureter were not evaluated on this CT scan abdomen exam.   No left hydroureteronephrosis.   Stomach/Bowel: No disproportionate dilation of the small or large bowel loops. No evidence of abnormal bowel wall thickening or inflammatory changes. Partially seen appendix appears within normal limits.   Vascular/Lymphatic: No ascites or pneumoperitoneum. There is extensive heterogeneous retroperitoneal lymphadenopathy, compatible with metastases. The largest such preaortic nodal mass measured up to 1.5 x 3.5 cm.  There also  multiple irregular heterogeneous retrocrural lymph nodes, also favored metastatic. No aneurysmal dilation of the major abdominal arteries. There are moderate peripheral atherosclerotic vascular calcifications of the aorta and its major branches.   Other:  The soft tissues and abdominal wall are unremarkable.   Musculoskeletal: No suspicious osseous lesions. There are mild multilevel degenerative changes in the visualized spine.   IMPRESSION: 1. There is a 1.6 x 1.7 x 2.0 cm hyperattenuating, moderately enhancing mass in the right kidney lower pole calyx, which is compatible with urothelial malignancy. There is subtle extension of this tumor into the anteroinferior wall of the right renal pelvis. 2. There is moderate right hydronephrosis and hydroureter up to the level of right mid ureter, which exhibit abrupt narrowing of the diameter and irregular enhancing walls and mild to moderate periureteric fat stranding. In view of suspected urothelial carcinoma, differential diagnosis includes focal ureteritis versus drop metastases. 3. There is an additional linear bandlike enhancing area in the right upper ureter which is also concerning for urothelial malignancy. 4. There is extensive heterogeneous retroperitoneal lymphadenopathy, compatible with metastases. 5. Multiple other nonacute observations, as described above.   Aortic Atherosclerosis (ICD10-I70.0)    Past Medical History:  Diagnosis Date   HERPES ZOSTER 10/18/2008   NECK PAIN 02/15/2009   PARESTHESIA 10/30/2009   PERS HX TOBACCO USE PRESENTING HAZARDS HEALTH 03/10/2010   SINUSITIS, ACUTE 05/03/2009   SORE THROAT 02/12/2009   TMJ SYNDROME 02/11/2009   Past Surgical History:  Procedure Laterality Date   DILATION AND CURETTAGE OF UTERUS     HERNIA REPAIR     WISDOM TOOTH EXTRACTION      reports that she has been smoking cigarettes. She has a 41 pack-year smoking history. She has never used smokeless tobacco. She reports that  she does not drink alcohol and does not use drugs. family history includes Arthritis in her father and mother; Colon cancer in her father; Esophageal cancer in her mother; Hyperlipidemia in her father; Lung cancer in her sister; Non-Hodgkin's lymphoma in her mother; Stroke in her mother. Allergies  Allergen Reactions   Iodine Hives and Swelling   Shellfish Allergy Hives and Swelling    Review of Systems  Constitutional:  Positive for malaise/fatigue and weight loss. Negative for fever.  Respiratory:  Negative for shortness of breath.   Cardiovascular:  Negative for chest pain.  Gastrointestinal:  Negative for nausea and vomiting.  Genitourinary:  Negative for hematuria.      Objective:     BP 108/70   Pulse 97   Temp 97.8 F (36.6 C) (Oral)   Wt 129 lb 3.2 oz (58.6 kg)   SpO2 96%   BMI 22.18 kg/m  BP Readings from Last 3 Encounters:  02/09/24 108/70  02/04/24 128/80  02/02/24 (!) 146/82   Wt Readings from Last 3 Encounters:  02/09/24 129 lb 3.2 oz (58.6 kg)  02/04/24 130 lb (59 kg)  02/02/24 129 lb (58.5 kg)      Physical Exam Vitals reviewed.  Cardiovascular:     Rate and Rhythm: Normal rate and regular rhythm.      No results found for any visits on 02/09/24.  Last CBC Lab Results  Component Value Date   WBC 8.2 01/03/2024   HGB 13.9 01/03/2024   HCT 41.7 01/03/2024   MCV 89.9 01/03/2024   MCH 29.2 10/01/2020   RDW 13.4 01/03/2024   PLT 246.0 01/03/2024   Last metabolic panel Lab Results  Component Value Date   GLUCOSE  85 01/03/2024   NA 139 01/03/2024   K 4.2 01/03/2024   CL 104 01/03/2024   CO2 29 01/03/2024   BUN 13 01/03/2024   CREATININE 0.85 01/03/2024   GFR 71.30 01/03/2024   CALCIUM 8.8 01/03/2024   PROT 6.4 01/03/2024   ALBUMIN 3.9 01/03/2024   BILITOT 0.3 01/03/2024   ALKPHOS 77 01/03/2024   AST 16 01/03/2024   ALT 9 01/03/2024   Last hemoglobin A1c Lab Results  Component Value Date   HGBA1C 6.0 03/04/2023   Last thyroid   functions Lab Results  Component Value Date   TSH 2.47 06/26/2022      The 10-year ASCVD risk score (Arnett DK, et al., 2019) is: 12.6%    Assessment & Plan:   Right renal mass compatible with urothelial malignancy with moderate right hydronephrosis and hydroureter and retroperitoneal lymphadenopathy.  Discussed results with patient and her sister.  Recommmend:  - set up urgent Urology referral -set up urgent Oncology referral -consider PET scan skull to thigh.  Wolm Scarlet, MD

## 2024-02-09 NOTE — Patient Instructions (Signed)
 I will be setting up referral to Urology, Oncology, and PET scan.

## 2024-02-10 ENCOUNTER — Telehealth: Payer: Self-pay | Admitting: Family Medicine

## 2024-02-10 ENCOUNTER — Encounter: Payer: Self-pay | Admitting: *Deleted

## 2024-02-10 ENCOUNTER — Encounter: Payer: Self-pay | Admitting: Family Medicine

## 2024-02-10 NOTE — Telephone Encounter (Signed)
 Copied from CRM #[id number]. Topic: Referral - Question >> Feb 10, 2024  9:49 AM Robinson H wrote: Reason for CRM: Sari calling to see if referrals can be authorized for patients PET and CT scan, states she has 2 spots on hold for the patient and can see her tomorrow if authorized. Sari states the referral coordinator can secure chat her and let her know.   Secure chat Sari Priestly [phone]

## 2024-02-10 NOTE — Progress Notes (Signed)
 PATIENT NAVIGATOR PROGRESS NOTE  Name: Ann Lee Date: 02/10/2024 MRN: [id number]  DOB: 21-Feb-1956   Reason for visit:  Introductory phone call  Comments:  Called and spoke with pt. Coordinated PET scan for tomorrow at 7am. She has appt with Dr Carolynn on 9/11 at 0715 and then Dr Cloretta on 9/11 at 1:40pm  reviewed directions to building and parking as well as one support person allowed in the appt  Verbalized understanding    Time spent counseling/coordinating care: > 60 minutes

## 2024-02-11 ENCOUNTER — Ambulatory Visit (HOSPITAL_COMMUNITY)
Admission: RE | Admit: 2024-02-11 | Discharge: 2024-02-11 | Disposition: A | Discharge status: Home / Self Care | Source: Ambulatory Visit | Attending: Family Medicine | Admitting: Family Medicine

## 2024-02-11 DIAGNOSIS — J439 Emphysema, unspecified: Secondary | ICD-10-CM | POA: Insufficient documentation

## 2024-02-11 DIAGNOSIS — R634 Abnormal weight loss: Secondary | ICD-10-CM | POA: Diagnosis not present

## 2024-02-11 DIAGNOSIS — R93421 Abnormal radiologic findings on diagnostic imaging of right kidney: Secondary | ICD-10-CM | POA: Insufficient documentation

## 2024-02-11 DIAGNOSIS — N2889 Other specified disorders of kidney and ureter: Secondary | ICD-10-CM | POA: Diagnosis not present

## 2024-02-11 DIAGNOSIS — I999 Unspecified disorder of circulatory system: Secondary | ICD-10-CM | POA: Diagnosis not present

## 2024-02-11 DIAGNOSIS — K5730 Diverticulosis of large intestine without perforation or abscess without bleeding: Secondary | ICD-10-CM | POA: Insufficient documentation

## 2024-02-11 DIAGNOSIS — N1330 Unspecified hydronephrosis: Secondary | ICD-10-CM | POA: Diagnosis not present

## 2024-02-11 DIAGNOSIS — N134 Hydroureter: Secondary | ICD-10-CM | POA: Diagnosis not present

## 2024-02-11 DIAGNOSIS — M899 Disorder of bone, unspecified: Secondary | ICD-10-CM | POA: Insufficient documentation

## 2024-02-11 DIAGNOSIS — I700 Atherosclerosis of aorta: Secondary | ICD-10-CM | POA: Diagnosis not present

## 2024-02-11 DIAGNOSIS — R591 Generalized enlarged lymph nodes: Secondary | ICD-10-CM | POA: Insufficient documentation

## 2024-02-11 DIAGNOSIS — R590 Localized enlarged lymph nodes: Secondary | ICD-10-CM | POA: Diagnosis not present

## 2024-02-11 LAB — GLUCOSE, CAPILLARY: Glucose-Capillary: 107 mg/dL — ABNORMAL HIGH (ref 70–99)

## 2024-02-11 MED ORDER — PREDNISONE 20 MG PO TABS: 20.0000 mg | ORAL_TABLET | Freq: Two times a day (BID) | ORAL | 0 refills | Status: AC

## 2024-02-11 MED ORDER — FLUDEOXYGLUCOSE F - 18 (FDG) INJECTION
6.3000 | Freq: Once | INTRAVENOUS | Status: AC
  Administered 2024-02-11: 6.3 via INTRAVENOUS

## 2024-02-15 ENCOUNTER — Encounter: Payer: Self-pay | Admitting: *Deleted

## 2024-02-15 ENCOUNTER — Ambulatory Visit: Payer: Self-pay | Admitting: Family Medicine

## 2024-02-15 ENCOUNTER — Ambulatory Visit: Admitting: Family Medicine

## 2024-02-15 ENCOUNTER — Encounter: Payer: Self-pay | Admitting: Family Medicine

## 2024-02-15 MED ORDER — LORAZEPAM 0.5 MG PO TABS: 0.5000 mg | ORAL_TABLET | Freq: Four times a day (QID) | ORAL | 0 refills | Status: DC | PRN

## 2024-02-15 NOTE — Progress Notes (Signed)
 Spoke with pt and have moved her appt to this Thursday at 10:45 am with Olam Ned NP

## 2024-02-15 NOTE — Telephone Encounter (Signed)
Prescription sent  Eulas Post MD [facility]

## 2024-02-16 NOTE — Progress Notes (Unsigned)
 New Hematology/Oncology Consult   Requesting MD: Dr. Wolm Scarlet  [phone]      Reason for Consult: Right renal mass  HPI: Ann Lee is a 68 year old woman with a 3-month history of progressively worsening back pain.  Around that same time she noted a change in bowel habits with incomplete evacuation and smaller appearance of stool.  She also began experiencing postprandial abdominal pain.  She feels bloated.    CTs abdomen/pelvis 02/01/2024 showed mild right-sided hydroureteronephrosis extending to the mid to distal ureter; multiple enlarged retroperitoneal lymph nodes involving the periaortic and aortocaval spaces; ovoid hyperdensity along the lower pole of the right kidney measuring 1.8 cm.  CT renal 02/08/2024 showed a 1.6 x 1.7 x 2.0 cm hyperattenuating moderately enhancing mass in the right kidney lower pole calyx with subtle extension of tumor into the anteroinferior wall of the right renal pelvis; moderate right hydronephrosis and hydroureter up to the level of the right mid ureter; additional linear bandlike enhancing area in the right upper ureter; extensive heterogeneous retroperitoneal lymphadenopathy.  PET scan 02/11/2024 showed hypermetabolic left supraclavicular, AP window, retrocrural, right gastric and retroperitoneal adenopathy; solitary hypermetabolic skeletal lesion in the vicinity of the right anterior S1 sacral foramen.     Past Medical History:  Diagnosis Date   HERPES ZOSTER 10/18/2008   NECK PAIN 02/15/2009   PARESTHESIA 10/30/2009   PERS HX TOBACCO USE PRESENTING HAZARDS HEALTH 03/10/2010   SINUSITIS, ACUTE 05/03/2009   SORE THROAT 02/12/2009   TMJ SYNDROME 02/11/2009     Past Surgical History:  Procedure Laterality Date   DILATION AND CURETTAGE OF UTERUS     HERNIA REPAIR     WISDOM TOOTH EXTRACTION       Current Outpatient Medications:    acetaminophen  (TYLENOL ) 500 MG tablet, Take 500 mg by mouth as needed., Disp: , Rfl:    ibuprofen  (ADVIL ) 200 MG  tablet, Take 200 mg by mouth as needed., Disp: , Rfl:    LORazepam  (ATIVAN ) 0.5 MG tablet, Take 1 tablet (0.5 mg total) by mouth every 6 (six) hours as needed for anxiety., Disp: 30 tablet, Rfl: 0   predniSONE  (DELTASONE ) 20 MG tablet, Take 1 tablet (20 mg total) by mouth 2 (two) times daily with a meal for 7 days., Disp: 14 tablet, Rfl: 0   SUMAtriptan  (IMITREX ) 25 MG tablet, TAKE ONE AT ONSET OF MIGRAINE AND MAY REPEAT ONE IN TWO HOURS AS NEEDED (MAX OF 2 IN 24 HOURS), Disp: 10 tablet, Rfl: 3   amLODipine  (NORVASC ) 5 MG tablet, Take 1 tablet (5 mg total) by mouth daily. (Patient not taking: Reported on 02/17/2024), Disp: 90 tablet, Rfl: 3   meloxicam  (MOBIC ) 15 MG tablet, Take 1 tablet (15 mg total) by mouth daily. (Patient not taking: Reported on 02/17/2024), Disp: 30 tablet, Rfl: 0   nystatin  cream (MYCOSTATIN ), Apply 1 Application topically 2 (two) times daily. (Patient not taking: Reported on 02/17/2024), Disp: 30 g, Rfl: 0:     Allergies  Allergen Reactions   Iodine Hives and Swelling   Shellfish Allergy Hives and Swelling    FH: Father with history of colorectal cancer, mother with history of non-Hodgkin's lymphoma, sister with history of lung cancer.  SOCIAL HISTORY: She lives in [place].  She has 4 children reported to be in good health.  She works as Catering manager of a private preschool.  She reports tobacco use of 1/2 pack/day for 45 years.  No EtOH intake.  Review of Systems: No fevers or sweats.  She  has lost weight recently coinciding with a decrease in appetite.  She attributes this to anxiety.  No pain except the low back.  She denies bleeding.  No dysphagia.  No cough or shortness of breath.  No bloody or black stools.  Change in bowel habits as noted above.  Also experiencing postprandial abdominal pain.  She feels bloated.  No consistent nausea/vomiting.  No hematuria.  No numbness or tingling in the hands or feet.  Last mammogram reported 6 to 7 years ago.  She reports a  negative Cologuard test 18 months ago.  She has never had a colonoscopy.  Physical Exam:  Blood pressure (!) 150/90, pulse 84, temperature 98.1 F (36.7 C), temperature source Temporal, resp. rate 18, height 5' 4 (1.626 m), weight 129 lb (58.5 kg), SpO2 98%.  HEENT: No thrush or ulcers.  No mass in the oral cavity. Lungs: Lungs clear bilaterally. Cardiac: Regular rate and rhythm. Abdomen: Mildly distended.  No hepatosplenomegaly.  No mass.  Nontender. Vascular: No leg edema.   Lymph nodes: No palpable cervical, supraclavicular, axillary or inguinal lymph nodes. Neurologic: Alert and oriented. Skin: No rash.  LABS:  No results for input(s): WBC, HGB, HCT, PLT in the last 72 hours.  No results for input(s): NA, K, CL, CO2, GLUCOSE, BUN, CREATININE, CALCIUM in the last 72 hours.    RADIOLOGY:  NM PET Image Initial (PI) Skull Base To Thigh (F-18 FDG) Result Date: 02/11/2024 CLINICAL DATA:  Initial treatment strategy for right renal mass. Unintentional weight loss. EXAM: NUCLEAR MEDICINE PET SKULL BASE TO THIGH TECHNIQUE: 6.3 mCi F-18 FDG was injected intravenously. Full-ring PET imaging was performed from the skull base to thigh after the radiotracer. CT data was obtained and used for attenuation correction and anatomic localization. Fasting blood glucose: 107 mg/dl COMPARISON:  CT scans from 02/08/2024 and 02/01/2024 FINDINGS: Mediastinal blood pool activity: SUV max 2.5 Liver activity: SUV max NA NECK: No significant abnormal hypermetabolic activity in this region. Incidental CT findings: None. CHEST: Clustered left supraclavicular nodes measuring up to 0.9 cm in short axis with maximum SUV 13.2, compatible with malignancy. AP window lymph node 1.3 cm in short axis on image 61 series 4, maximum SUV 14.6 compatible with malignancy. Incidental CT findings: Aortic and branch vessel atheromatous vascular disease. Biapical pleuroparenchymal scarring. Centrilobular  emphysema. ABDOMEN/PELVIS: Hypermetabolic retrocrural, right gastric, retroperitoneal adenopathy compatible with malignancy. Retroperitoneal adenopathy anterior to the abdominal aorta measures 1.9 cm in short axis on image 131 series 4 with maximum SUV 16.5. The patient has a known right kidney lower pole mass along the medullary pyramid/calyx anteriorly based on recent CT examination, this mass is not readily differential from surrounding activity in the distended right collecting system. Right hydroureter extends to the iliac vessel cross over, tumor in the ureter not excluded. Incidental CT findings: Moderate right hydronephrosis and proximal hydroureter. Sigmoid colon diverticulosis. SKELETON: Solitary skeletal hypermetabolic lesion in the vicinity of the right anterior S1 sacral foramen, probably in the bone just anterior to the foramen, maximum SUV 13.5 compatible with malignancy. Incidental CT findings: None. IMPRESSION: 1. Hypermetabolic left supraclavicular, AP window, retrocrural, right gastric, and retroperitoneal adenopathy compatible with malignancy. 2. Solitary hypermetabolic skeletal lesion in the vicinity of the right anterior S1 sacral foramen, probably in the bone just anterior to the foramen, maximum SUV 13.5 compatible with malignancy. 3. The patient has a known right kidney lower pole mass along the medullary pyramid/calyx anteriorly based on recent CT examination, this mass is not readily differential from  surrounding activity in the distended right collecting system. Right hydroureter extends to the iliac vessel cross over, tumor in the ureter not excluded. 4. Moderate right hydronephrosis and proximal hydroureter. 5. Sigmoid colon diverticulosis. 6. Aortic and branch vessel atheromatous vascular disease. 7. Aortic Atherosclerosis (ICD10-I70.0) and Emphysema (ICD10-J43.9). Electronically Signed   By: Ryan Salvage M.D.   On: 02/11/2024 08:54   CT RENAL ABD W/WO Result Date:  02/08/2024 CLINICAL DATA:  Renal mass/cyst, indeterminate. * Tracking Code: BO * EXAM: CT ABDOMEN WITHOUT AND WITH CONTRAST TECHNIQUE: Multidetector CT imaging of the abdomen was performed following the standard protocol before and following the bolus administration of intravenous contrast. RADIATION DOSE REDUCTION: This exam was performed according to the departmental dose-optimization program which includes automated exposure control, adjustment of the mA and/or kV according to patient size and/or use of iterative reconstruction technique. CONTRAST:  OMNIPAQUE  IOHEXOL  300 MG/ML  SOLN COMPARISON:  CT scan abdomen and pelvis from 02/01/2024. FINDINGS: Lower chest: The lung bases are clear. No pleural effusion. The heart is normal in size. No pericardial effusion. Hepatobiliary: The liver is normal in size. Non-cirrhotic configuration. No suspicious mass. No intrahepatic or extrahepatic bile duct dilation. No calcified gallstones. Normal gallbladder wall thickness. No pericholecystic inflammatory changes. Pancreas: Unremarkable. No pancreatic ductal dilatation or surrounding inflammatory changes. Spleen: Within normal limits. No focal lesion. Adrenals/Urinary Tract: Adrenal glands are unremarkable. Non-contrast images: No radiopaque urinary tract calculi. There is a 1.6 x 1.7 x 2.0 cm hyperattenuating mass in the right kidney lower pole calyx which exhibits moderate enhancement on the post-contrast images and is compatible with urothelial malignancy. There is subtle extension of this tumor into the anteroinferior wall of the right renal pelvis (series 6, image 74). No other suspicious renal lesion seen on either side. No nephroureterolithiasis on either side. Kidneys: Symmetric enhancement. Urinary Tract Opacification: Suboptimal. Collecting Systems and Ureters: There is moderate right hydronephrosis and hydroureter up to the level of right mid ureter (series 12, image 44), which exhibit abrupt narrowing of the  diameter and irregular enhancing walls and mild to moderate periureteric fat stranding. There is also a linear bandlike enhancing area in the right upper ureter (series 10, image 102 and series 14, image 69). Please note right mid lower ureter were not evaluated on this CT scan abdomen exam. No left hydroureteronephrosis. Stomach/Bowel: No disproportionate dilation of the small or large bowel loops. No evidence of abnormal bowel wall thickening or inflammatory changes. Partially seen appendix appears within normal limits. Vascular/Lymphatic: No ascites or pneumoperitoneum. There is extensive heterogeneous retroperitoneal lymphadenopathy, compatible with metastases. The largest such preaortic nodal mass measured up to 1.5 x 3.5 cm. There also multiple irregular heterogeneous retrocrural lymph nodes, also favored metastatic. No aneurysmal dilation of the major abdominal arteries. There are moderate peripheral atherosclerotic vascular calcifications of the aorta and its major branches. Other:  The soft tissues and abdominal wall are unremarkable. Musculoskeletal: No suspicious osseous lesions. There are mild multilevel degenerative changes in the visualized spine. IMPRESSION: 1. There is a 1.6 x 1.7 x 2.0 cm hyperattenuating, moderately enhancing mass in the right kidney lower pole calyx, which is compatible with urothelial malignancy. There is subtle extension of this tumor into the anteroinferior wall of the right renal pelvis. 2. There is moderate right hydronephrosis and hydroureter up to the level of right mid ureter, which exhibit abrupt narrowing of the diameter and irregular enhancing walls and mild to moderate periureteric fat stranding. In view of suspected urothelial carcinoma, differential diagnosis includes  focal ureteritis versus drop metastases. 3. There is an additional linear bandlike enhancing area in the right upper ureter which is also concerning for urothelial malignancy. 4. There is extensive  heterogeneous retroperitoneal lymphadenopathy, compatible with metastases. 5. Multiple other nonacute observations, as described above. Aortic Atherosclerosis (ICD10-I70.0). Electronically Signed   By: Ree Molt M.D.   On: 02/08/2024 13:35   CT ABDOMEN PELVIS WO CONTRAST Result Date: 02/01/2024 CLINICAL DATA:  Severe lower back and pelvic pain. Changes in size and in shape of BM. EXAM: CT ABDOMEN AND PELVIS WITHOUT CONTRAST TECHNIQUE: Multidetector CT imaging of the abdomen and pelvis was performed following the standard protocol without IV contrast. RADIATION DOSE REDUCTION: This exam was performed according to the departmental dose-optimization program which includes automated exposure control, adjustment of the mA and/or kV according to patient size and/or use of iterative reconstruction technique. COMPARISON:  January 16, 2024 FINDINGS: Of note, the lack of intravenous contrast limits evaluation of the solid organ parenchyma and vascularity. Lower chest: No focal airspace consolidation or pleural effusion. Hepatobiliary: No mass.Focal fatty infiltration along the falciform ligament.No radiopaque stones or wall thickening of the gallbladder. No intrahepatic or extrahepatic biliary ductal dilation. Pancreas: No mass or main ductal dilation. No peripancreatic inflammation or fluid collection. Spleen: Normal size. No mass. Adrenals/Urinary Tract: No adrenal masses. Ovoid hyperdensity along the right lower pole of the kidney measuring 1.8 cm in transverse dimension (axial 46). Mild right-sided hydronephrosis extending to the mid to distal ureter. No obstructive ureteral calculi present, but perinephric and periureteric stranding is present. The urinary bladder is distended without focal abnormality. Stomach/Bowel: The stomach is decompressed without focal abnormality. No small bowel wall thickening or inflammation. No small bowel obstruction.No extravasation of enteric contrast to suggest bowel perforation. The  enteric contrast is present to the level of the distal sigmoid colon.Normal appendix. Sigmoid colonic diverticulosis. No changes of acute diverticulitis. Vascular/Lymphatic: No aortic aneurysm. Diffuse aortoiliac atherosclerosis. Multiple enlarged retroperitoneal lymph nodes involving the periaortic and aortocaval spaces. For example, there is an enlarged lymph node in the anterior aortocaval region measuring 1.3 cm (axial 34). A second aortocaval periaortic lymph node measures 1.5 cm (axial 40). 1.2 cm periaortic lymph node (axial 36). The remainder of the periaortic lymph nodes measure up to 9 mm. Reproductive: Age-related atrophy of the uterus and ovaries. No concerning adnexal mass. No free pelvic fluid. Other: No pneumoperitoneum or ascites. Musculoskeletal: No acute fracture or destructive lesion. Multilevel degenerative disc disease of the spine. IMPRESSION: 1. Mild right-sided hydroureteronephrosis extending to the mid to distal ureter. No obstructive ureteral calculi present. This could represent changes from a recently passed calculus versus an ascending urinary tract infection. Correlation with urinalysis is recommended. 2. Multiple enlarged retroperitoneal lymph nodes involving the periaortic and aortocaval spaces. For example, the largest lymph node measures 1.5 cm in short axis dimension (axial 40). These are worrisome for either metastatic disease or lymphoma. 3. Ovoid hyperdensity along the lower pole of the right kidney, measuring 1.8 cm, possibly representing blood within a renal calyx versus a renal or urothelial neoplasm. CT urogram recommended. These results will be called to the ordering clinician or representative by the Radiologist Assistant and communication documented in the PACS or [employer]. Aortic Atherosclerosis (ICD10-I70.0). Electronically Signed   By: Rogelia Myers M.D.   On: 02/01/2024 13:58   DG Lumbar Spine Complete Result Date: 01/27/2024 CLINICAL DATA:  persistent  low back pain. EXAM: LUMBAR SPINE - COMPLETE 4+ VIEW COMPARISON:  None Available. FINDINGS: There are  5 nonrib-bearing lumbar vertebrae. Anatomic lumbar curvature. No spondylolysis or spondylolisthesis. Vertebral body heights are maintained. No aggressive osseous lesion. Intervertebral disc heights are maintained. Mild multilevel facet arthropathy and marginal osteophyte formation. Sacroiliac joints are symmetric. Visualized soft tissues are within normal limits. IMPRESSION: No acute osseous abnormality of the lumbar spine. Mild multilevel degenerative changes. Electronically Signed   By: Ree Molt M.D.   On: 01/27/2024 10:43   US  Pelvis Complete Result Date: 01/25/2024 CLINICAL DATA:  Lower pelvic pain and bloating for 6 weeks. EXAM: TRANSABDOMINAL ULTRASOUND OF PELVIS TECHNIQUE: Transabdominal ultrasound examination of the pelvis was performed including evaluation of the uterus, ovaries, adnexal regions, and pelvic cul-de-sac. COMPARISON:  None Available. FINDINGS: Uterus Measurements: 5.0 x 1.7 x 2.6 cm = volume: 11.6 mL. No fibroids or other mass visualized. Endometrium Thickness: 3.7 mm.  No focal abnormality visualized. Right ovary Measurements: 1.8 x 1.1 x 1.9 cm = volume: 1.9 mL. Normal appearance/no adnexal mass. Left ovary Measurements: Not visualized.  No adnexal mass. Other findings:  No abnormal free fluid. IMPRESSION: 1. Normal appearance of the uterus and right ovary. 2. Nonvisualization of the left ovary. No adnexal mass. Electronically Signed   By: MYRTIS Stammer M.D.   On: 01/25/2024 09:06    Assessment and Plan:   Right renal mass CTs abdomen/pelvis 02/01/2024-mild right-sided hydroureteronephrosis extending to the mid to distal ureter; multiple enlarged retroperitoneal lymph nodes involving the periaortic and aortocaval spaces; ovoid hyperdensity along the lower pole of the right kidney measuring 1.8 cm.   CT renal 02/08/2024-1.6 x 1.7 x 2.0 cm hyperattenuating moderately enhancing  mass in the right kidney lower pole calyx with subtle extension of tumor into the anteroinferior wall of the right renal pelvis; moderate right hydronephrosis and hydroureter up to the level of the right mid ureter; additional linear bandlike enhancing area in the right upper ureter; extensive heterogeneous retroperitoneal lymphadenopathy.   PET scan 02/11/2024-hypermetabolic left supraclavicular, AP window, retrocrural, right gastric and retroperitoneal adenopathy; solitary hypermetabolic skeletal lesion in the vicinity of the right anterior S1 sacral foramen. Back pain secondary to #1 Change in bowel habits, postprandial abdominal pain Emphysema Tobacco use  Ann Lee has a right renal mass, supraclavicular/mediastinal/retroperitoneal adenopathy and a solitary bone lesion.  We will request review by interventional radiology to consider biopsy of supraclavicular or retroperitoneal adenopathy.  She is symptomatic with back pain.  The pain is likely related to retroperitoneal adenopathy.  Prescription sent to her pharmacy for hydrocodone  1/2 to 1 tablet every 6 hours as needed.  She understands she should not drive while taking pain medication and should not take at the same time as lorazepam .  She will return for follow-up in 2 weeks to review biopsy results.  We are available to see her sooner if needed.  CT and PET report/images reviewed with Ann Lee and her family at today's visit.  Patient seen with Dr. Cloretta.    Olam Ned, NP 02/17/2024, 12:36 PM  This was a shared visit with Olam Ned.  Ann Lee was interviewed and examined.  We reviewed CT and pet images with Ann Lee and her family.  She presents with back pain, abdominal pain, and a change in bowel habits.  The imaging studies are consistent with metastatic renal cell carcinoma.  The differential diagnosis includes a gastrointestinal malignancy and metastatic disease from another primary tumor site.  She will be  referred for a diagnostic biopsy in interventional radiology.  She will begin hydrocodone  as needed for pain.  I  was present for greater than 50% of today's visit.  I performed medical decision making.  Arvella Hof, MD

## 2024-02-17 ENCOUNTER — Inpatient Hospital Stay: Attending: Nurse Practitioner | Admitting: Nurse Practitioner

## 2024-02-17 ENCOUNTER — Encounter: Payer: Self-pay | Admitting: Nurse Practitioner

## 2024-02-17 VITALS — BP 150/90 | HR 84 | Temp 98.1°F | Resp 18 | Ht 64.0 in | Wt 129.0 lb

## 2024-02-17 DIAGNOSIS — Z800 Family history of malignant neoplasm of digestive organs: Secondary | ICD-10-CM

## 2024-02-17 DIAGNOSIS — R319 Hematuria, unspecified: Secondary | ICD-10-CM | POA: Insufficient documentation

## 2024-02-17 DIAGNOSIS — M549 Dorsalgia, unspecified: Secondary | ICD-10-CM | POA: Diagnosis not present

## 2024-02-17 DIAGNOSIS — R634 Abnormal weight loss: Secondary | ICD-10-CM | POA: Insufficient documentation

## 2024-02-17 DIAGNOSIS — R112 Nausea with vomiting, unspecified: Secondary | ICD-10-CM | POA: Insufficient documentation

## 2024-02-17 DIAGNOSIS — R590 Localized enlarged lymph nodes: Secondary | ICD-10-CM | POA: Diagnosis not present

## 2024-02-17 DIAGNOSIS — Z801 Family history of malignant neoplasm of trachea, bronchus and lung: Secondary | ICD-10-CM

## 2024-02-17 DIAGNOSIS — N2889 Other specified disorders of kidney and ureter: Secondary | ICD-10-CM | POA: Diagnosis not present

## 2024-02-17 DIAGNOSIS — M899 Disorder of bone, unspecified: Secondary | ICD-10-CM | POA: Diagnosis not present

## 2024-02-17 DIAGNOSIS — Z79620 Long term (current) use of immunosuppressive biologic: Secondary | ICD-10-CM | POA: Insufficient documentation

## 2024-02-17 DIAGNOSIS — C651 Malignant neoplasm of right renal pelvis: Secondary | ICD-10-CM | POA: Insufficient documentation

## 2024-02-17 DIAGNOSIS — Z807 Family history of other malignant neoplasms of lymphoid, hematopoietic and related tissues: Secondary | ICD-10-CM

## 2024-02-17 DIAGNOSIS — Z5112 Encounter for antineoplastic immunotherapy: Secondary | ICD-10-CM | POA: Insufficient documentation

## 2024-02-17 DIAGNOSIS — C770 Secondary and unspecified malignant neoplasm of lymph nodes of head, face and neck: Secondary | ICD-10-CM | POA: Insufficient documentation

## 2024-02-17 DIAGNOSIS — Z720 Tobacco use: Secondary | ICD-10-CM | POA: Insufficient documentation

## 2024-02-17 MED ORDER — HYDROCODONE-ACETAMINOPHEN 5-325 MG PO TABS: 0.5000 | ORAL_TABLET | Freq: Four times a day (QID) | ORAL | 0 refills | Status: DC | PRN

## 2024-02-18 ENCOUNTER — Encounter (HOSPITAL_COMMUNITY): Payer: Self-pay

## 2024-02-18 ENCOUNTER — Ambulatory Visit (HOSPITAL_COMMUNITY)

## 2024-02-18 NOTE — Progress Notes (Signed)
 Jenna Cordella LABOR, MD  Michaelene Setter PROCEDURE / BIOPSY REVIEW Date: 02/18/24  Requested Biopsy site: left supraclavicular lymph node Reason for request: FDG avid lymph nodes, renal mass Imaging review: Best seen on PET  Decision: Approved Imaging modality to perform: Ultrasound Schedule with: No sedation / Local anesthetic Schedule for: Any VIR  Additional comments: @Schedulers .   Please contact me with questions, concerns, or if issue pertaining to this request arise.  Cordella LABOR Jenna, MD [facility]       Previous Messages    ----- Message ----- From: Adolfo Granieri Sent: 02/17/2024   2:43 PM EDT To: Doretha Goding; Ir Procedure Requests Subject: US  Core Biopsy ( Lymph nodes)                  Procedure : US  Core Biopsy ( Lymph nodes)  Reason :; biopsy left supraclavicular adenopathy or retroperitoneal adenopathy Dx: Renal mass, right [W71.10 (ICD-10-CM)]  Ordering Comments  Patient has a right renal mass.  Left supraclavicular and retroperitoneal adenopathy on recent PET.  Please evaluate for biopsy.    History :NM PET image initial skull base to thigh , CT renal and w wo , CT abd pelv w/o , US  pelvis , US  abd complete  Provider : Debby Olam POUR, NP  Contact :  [phone]

## 2024-02-22 ENCOUNTER — Other Ambulatory Visit (HOSPITAL_COMMUNITY): Payer: Self-pay

## 2024-02-22 ENCOUNTER — Telehealth: Payer: Self-pay

## 2024-02-22 NOTE — Telephone Encounter (Signed)
 Pharmacy Patient Advocate Encounter   Received notification from Onbase that prior authorization for LORazepam  0.5MG  tablets  is required/requested.   Insurance verification completed.   The patient is insured through [facility] .   Per test claim: PA required; PA started via CoverMyMeds. KEY [id number] . Please see clinical question(s) below that I am not finding the answer to in their chart and advise.  *Need diagnosis code and supporting chart documentation

## 2024-02-23 ENCOUNTER — Other Ambulatory Visit (HOSPITAL_COMMUNITY): Payer: Self-pay

## 2024-02-23 ENCOUNTER — Ambulatory Visit: Admitting: Oncology

## 2024-02-23 ENCOUNTER — Ambulatory Visit: Admitting: Family Medicine

## 2024-02-23 ENCOUNTER — Encounter: Payer: Self-pay | Admitting: Family Medicine

## 2024-02-23 VITALS — BP 152/90 | HR 91 | Temp 98.3°F | Wt 128.9 lb

## 2024-02-23 DIAGNOSIS — R059 Cough, unspecified: Secondary | ICD-10-CM | POA: Diagnosis not present

## 2024-02-23 LAB — POCT INFLUENZA A/B
Influenza A, POC: NEGATIVE
Influenza B, POC: NEGATIVE

## 2024-02-23 LAB — POC COVID19 BINAXNOW: SARS Coronavirus 2 Ag: NEGATIVE

## 2024-02-23 MED ORDER — AMOXICILLIN-POT CLAVULANATE 875-125 MG PO TABS: 1.0000 | ORAL_TABLET | Freq: Two times a day (BID) | ORAL | 0 refills | Status: DC

## 2024-02-23 MED ORDER — HYDROCODONE BIT-HOMATROP MBR 5-1.5 MG/5ML PO SOLN: 5.0000 mL | Freq: Four times a day (QID) | ORAL | 0 refills | Status: DC | PRN

## 2024-02-23 NOTE — Progress Notes (Signed)
 Established Patient Office Visit  Subjective   Patient ID: Ann Lee, female    DOB: [birth year]  Age: 68 y.o. MRN: [id number]  Chief Complaint  Patient presents with   Cough   Nasal Congestion    HPI   Ann Lee is seen today as a work in with some nasal congestion and cough which has been productive past few days.  She does work in a daycare.  Denies any fever.  Cough has been severe at times.  She is having some ongoing back difficulties and has follow-up scheduled tomorrow for lymph node biopsy.  She has seen oncology and has pending follow-up with urologist.  She is been very anxious regarding her recent cancer diagnosis.  They are waiting for biopsy results to formulate a plan.  She tried some NyQuil past couple nights without much improvement and had difficulty sleeping.  Blood pressure is up quite a bit today and she attributes this to her back pain.  She was recently prescribed oral hydrocodone  but is not taking regularly at this point.  She states in general she is very cautious with medications.  Does have longstanding history of smoking  Past Medical History:  Diagnosis Date   HERPES ZOSTER 10/18/2008   NECK PAIN 02/15/2009   PARESTHESIA 10/30/2009   PERS HX TOBACCO USE PRESENTING HAZARDS HEALTH 03/10/2010   SINUSITIS, ACUTE 05/03/2009   SORE THROAT 02/12/2009   TMJ SYNDROME 02/11/2009   Past Surgical History:  Procedure Laterality Date   DILATION AND CURETTAGE OF UTERUS     HERNIA REPAIR     WISDOM TOOTH EXTRACTION      reports that she has been smoking cigarettes. She has a 41 pack-year smoking history. She has never used smokeless tobacco. She reports that she does not drink alcohol and does not use drugs. family history includes Arthritis in her father and mother; Colon cancer in her father; Esophageal cancer in her mother; Hyperlipidemia in her father; Lung cancer in her sister; Non-Hodgkin's lymphoma in her mother; Stroke in her mother. Allergies  Allergen  Reactions   Iodine Hives and Swelling   Shellfish Allergy Hives and Swelling    Review of Systems  Constitutional:  Negative for chills and fever.  Respiratory:  Positive for cough and sputum production. Negative for hemoptysis and wheezing.   Cardiovascular:  Negative for chest pain.      Objective:     BP (!) 152/90 (BP Location: Left Arm, Cuff Size: Normal)   Pulse 91   Temp 98.3 F (36.8 C) (Oral)   Wt 128 lb 14.4 oz (58.5 kg)   SpO2 95%   BMI 22.13 kg/m  BP Readings from Last 3 Encounters:  02/23/24 (!) 152/90  02/17/24 (!) 150/90  02/09/24 108/70   Wt Readings from Last 3 Encounters:  02/23/24 128 lb 14.4 oz (58.5 kg)  02/17/24 129 lb (58.5 kg)  02/09/24 129 lb 3.2 oz (58.6 kg)      Physical Exam Vitals reviewed.  Constitutional:      General: She is not in acute distress.    Appearance: She is not ill-appearing.  HENT:     Right Ear: Tympanic membrane normal.     Left Ear: Tympanic membrane normal.  Cardiovascular:     Rate and Rhythm: Normal rate and regular rhythm.  Pulmonary:     Effort: Pulmonary effort is normal.     Breath sounds: Normal breath sounds. No wheezing or rales.  Musculoskeletal:     Right lower leg: No  edema.     Left lower leg: No edema.  Neurological:     Mental Status: She is alert.      Results for orders placed or performed in visit on 02/23/24  POC Influenza A/B  Result Value Ref Range   Influenza A, POC Negative Negative   Influenza B, POC Negative Negative  POC COVID-19  Result Value Ref Range   SARS Coronavirus 2 Ag Negative Negative      The 10-year ASCVD risk score (Arnett DK, et al., 2019) is: 23.6%    Assessment & Plan:   Problem List Items Addressed This Visit   None Visit Diagnoses       Cough, unspecified type    -  Primary   Relevant Orders   POC Influenza A/B (Completed)   POC COVID-19 (Completed)     [name] is seen today with couple day history of productive cough.  Influenza and COVID  testing negative.  She does work in a daycare and exposed frequently to all sorts of infectious things there.  This may all be viral but with her longstanding history of smoking and other medical issues recently with renal cancer decided to go and cover with Augmentin  875 mg twice daily for 7 days.  -Patient requesting hydrocodone  cough syrup.  She knows not to mix this with oral Vicodin or any other pain medication.  We sent in limited Hycodan 1 teaspoon every 6 hours as needed for severe cough. - Follow-up promptly for any fever, increased shortness of breath, or other concerns.  No follow-ups on file.    Wolm Scarlet, MD

## 2024-02-23 NOTE — Telephone Encounter (Signed)
 Pharmacy Patient Advocate Encounter   Received notification from Pt Calls Messages that prior authorization for  LORazepam  0.5MG  tablets  is required/requested.   Insurance verification completed.   The patient is insured through [facility] .   Per test claim: PA required; PA submitted to above mentioned insurance via Latent Key/confirmation #/EOC [id number] Status is pending

## 2024-02-23 NOTE — Progress Notes (Signed)
 Patient for US  guided core LT supraclavicular LN biopsy on Thurs 02/24/24, I called and spoke with the patient on the phone and gave pre-procedure instructions. Pt was made aware to be here at 1p and check in at the [facility] registration desk. Pt stated understanding. Called 02/23/24

## 2024-02-23 NOTE — Telephone Encounter (Signed)
 Pharmacy Patient Advocate Encounter  Received notification from [facility] that Prior Authorization for LORazepam  0.5MG  tablets  has been APPROVED from 02/23/24 to 02/22/25. Ran test claim, Copay is $1.60. This test claim was processed through [facility]- copay amounts may vary at other pharmacies due to pharmacy/plan contracts, or as the patient moves through the different stages of their insurance plan.   PA #/Case ID/Reference #: [id number]

## 2024-02-23 NOTE — Telephone Encounter (Signed)
 Copied from CRM [id number]. Topic: Clinical - Medication Prior Auth >> Feb 23, 2024  1:55 PM Aleatha C wrote: Reason for CRM: Annemarie from [employer] call  to confirm that the  Lorazepam  0.5 tablet was approved today for a full year 02/22/2025 and will be sending a detail fax over after today's phone call

## 2024-02-24 ENCOUNTER — Ambulatory Visit
Admission: RE | Admit: 2024-02-24 | Discharge: 2024-02-24 | Disposition: A | Discharge status: Home / Self Care | Source: Ambulatory Visit | Attending: Nurse Practitioner | Admitting: Nurse Practitioner

## 2024-02-24 ENCOUNTER — Ambulatory Visit: Admitting: Oncology

## 2024-02-24 DIAGNOSIS — R590 Localized enlarged lymph nodes: Secondary | ICD-10-CM | POA: Diagnosis not present

## 2024-02-24 DIAGNOSIS — N289 Disorder of kidney and ureter, unspecified: Secondary | ICD-10-CM | POA: Diagnosis not present

## 2024-02-24 DIAGNOSIS — C778 Secondary and unspecified malignant neoplasm of lymph nodes of multiple regions: Secondary | ICD-10-CM | POA: Diagnosis not present

## 2024-02-24 DIAGNOSIS — N2889 Other specified disorders of kidney and ureter: Secondary | ICD-10-CM

## 2024-02-24 DIAGNOSIS — C801 Malignant (primary) neoplasm, unspecified: Secondary | ICD-10-CM | POA: Diagnosis not present

## 2024-02-24 DIAGNOSIS — N130 Hydronephrosis with ureteropelvic junction obstruction: Secondary | ICD-10-CM | POA: Diagnosis not present

## 2024-02-24 DIAGNOSIS — C770 Secondary and unspecified malignant neoplasm of lymph nodes of head, face and neck: Secondary | ICD-10-CM | POA: Diagnosis not present

## 2024-02-24 DIAGNOSIS — D49511 Neoplasm of unspecified behavior of right kidney: Secondary | ICD-10-CM | POA: Diagnosis not present

## 2024-02-24 DIAGNOSIS — R591 Generalized enlarged lymph nodes: Secondary | ICD-10-CM | POA: Diagnosis not present

## 2024-02-24 DIAGNOSIS — R1084 Generalized abdominal pain: Secondary | ICD-10-CM | POA: Diagnosis not present

## 2024-02-24 MED ORDER — LIDOCAINE HCL (PF) 1 % IJ SOLN
10.0000 mL | Freq: Once | INTRAMUSCULAR | Status: AC
Start: 2024-02-24 — End: 2024-02-24
  Administered 2024-02-24: 10 mL via INTRADERMAL
  Filled 2024-02-24: qty 10

## 2024-02-24 MED ORDER — LIDOCAINE HCL (PF) 1 % IJ SOLN: 10.0000 mL | Freq: Once | INTRAMUSCULAR | Status: DC

## 2024-02-24 NOTE — Procedures (Signed)
 Interventional Radiology Procedure:   Indications:  Right renal lesion with lymphadenopathy  Procedure: US  guided core biopsy of left supraclavicular lymph node  Findings: Core biopsies from an abnormal left supraclavicular node.  Complications: None     EBL: Minimal  Plan: Discharge to home.  Ezequias Lard R. Philip, MD  Pager: [phone]

## 2024-02-25 LAB — SURGICAL PATHOLOGY

## 2024-02-28 ENCOUNTER — Encounter: Payer: Self-pay | Admitting: *Deleted

## 2024-02-28 ENCOUNTER — Other Ambulatory Visit: Payer: Self-pay | Admitting: Urology

## 2024-02-28 ENCOUNTER — Telehealth: Payer: Self-pay | Admitting: *Deleted

## 2024-02-28 NOTE — Progress Notes (Signed)
 MMR and Foundation one testing requested on [phone]

## 2024-02-28 NOTE — Telephone Encounter (Signed)
 Returned patient's call to discuss her lower right back pain that has progressed since last office visit. It is a constant bursting out sensation in her back. No loss of bladder/bowel control and is ambulatory without difficulty. Asking if OK to half there hydrocodone /apap as she is sensitive to narcotics. Confirmed w/her OK to take 1/2 tablet if she prefers and could also supplement with ibuprofen  between doses and try heating pad few times/day, but do not fall asleep with pad. She agrees to try these approaches.  Asking if Dr. Cloretta has heard from Dr. Carolynn yet? She was told they may be planning surgery on 9/17, but she has not heard from anyone yet.

## 2024-02-28 NOTE — Progress Notes (Signed)
 For Anesthesia: PCP - Wolm LELON Scarlet, MD  Cardiologist - N/A  Bowel Prep reminder:N/A  Chest x-ray - N/A EKG - greater than 1 year Stress Test - greater than 2 years ECHO - N/A Cardiac Cath - N/A Pacemaker/ICD device last checked: N/A Pacemaker orders received: N/A Device Rep notified: N/A  Spinal Cord Stimulator:N/A  Sleep Study - N/A CPAP - N/A  Fasting Blood Sugar - N/A Checks Blood Sugar __N/A___ times a day Date and result of last Hgb A1c-N/A  Last dose of GLP1 agonist-  N/A GLP1 instructions: Hold 7 days prior to schedule (Hold 24 hours-daily)   Last dose of SGLT-2 inhibitors- N/A SGLT-2 instructions: Hold 72 hours prior to surgery  Blood Thinner Instructions: N/A Last Dose: N/A Time last taken:N/A  Aspirin Instructions: N/A Last Dose: N/A Time last taken: N/A  Activity level: Able to exercise without chest pain and/or shortness of breath   Anesthesia review: N/A  Patient denies shortness of breath, fever, cough and chest pain at PAT appointment   Patient verbalized understanding of instructions that were reviewed over the telephone.

## 2024-02-28 NOTE — Progress Notes (Signed)
 Sent message, via epic in basket, requesting orders in epic from Careers [facility].

## 2024-02-28 NOTE — Progress Notes (Signed)
 Faxed notification from [facility] that they have received order and are working on getting tissue.

## 2024-02-29 ENCOUNTER — Telehealth: Payer: Self-pay | Admitting: *Deleted

## 2024-02-29 ENCOUNTER — Other Ambulatory Visit: Payer: Self-pay

## 2024-02-29 ENCOUNTER — Encounter (HOSPITAL_COMMUNITY): Payer: Self-pay | Admitting: Urology

## 2024-02-29 NOTE — Telephone Encounter (Signed)
 Called Ann Lee to let her know that Dr. Cloretta has not spoken with Dr. Carolynn, but her lymph node path was consistent with urothelial cancer. She will not require surgery. Most likely chemotherapy/immunotherapy. Ordered Foundation One and MMR testing on tissue.

## 2024-03-01 ENCOUNTER — Encounter (HOSPITAL_COMMUNITY): Payer: Self-pay | Admitting: Urology

## 2024-03-01 ENCOUNTER — Ambulatory Visit (HOSPITAL_COMMUNITY): Admitting: Anesthesiology

## 2024-03-01 ENCOUNTER — Encounter (HOSPITAL_COMMUNITY)
Admission: RE | Disposition: A | Discharge status: Home / Self Care | Payer: Self-pay | Source: Home / Self Care | Attending: Urology

## 2024-03-01 ENCOUNTER — Encounter: Payer: Self-pay | Admitting: Gastroenterology

## 2024-03-01 ENCOUNTER — Other Ambulatory Visit: Payer: Self-pay

## 2024-03-01 ENCOUNTER — Ambulatory Visit (HOSPITAL_COMMUNITY)

## 2024-03-01 ENCOUNTER — Ambulatory Visit (HOSPITAL_COMMUNITY)
Admission: RE | Admit: 2024-03-01 | Discharge: 2024-03-01 | Disposition: A | Discharge status: Home / Self Care | Attending: Urology | Admitting: Urology

## 2024-03-01 DIAGNOSIS — N2889 Other specified disorders of kidney and ureter: Secondary | ICD-10-CM | POA: Diagnosis not present

## 2024-03-01 DIAGNOSIS — R519 Headache, unspecified: Secondary | ICD-10-CM | POA: Insufficient documentation

## 2024-03-01 DIAGNOSIS — C651 Malignant neoplasm of right renal pelvis: Secondary | ICD-10-CM | POA: Diagnosis not present

## 2024-03-01 DIAGNOSIS — J439 Emphysema, unspecified: Secondary | ICD-10-CM | POA: Insufficient documentation

## 2024-03-01 DIAGNOSIS — K5730 Diverticulosis of large intestine without perforation or abscess without bleeding: Secondary | ICD-10-CM | POA: Insufficient documentation

## 2024-03-01 DIAGNOSIS — D49511 Neoplasm of unspecified behavior of right kidney: Secondary | ICD-10-CM | POA: Diagnosis not present

## 2024-03-01 DIAGNOSIS — C772 Secondary and unspecified malignant neoplasm of intra-abdominal lymph nodes: Secondary | ICD-10-CM | POA: Diagnosis not present

## 2024-03-01 DIAGNOSIS — I700 Atherosclerosis of aorta: Secondary | ICD-10-CM | POA: Diagnosis not present

## 2024-03-01 DIAGNOSIS — F17210 Nicotine dependence, cigarettes, uncomplicated: Secondary | ICD-10-CM | POA: Insufficient documentation

## 2024-03-01 DIAGNOSIS — D649 Anemia, unspecified: Secondary | ICD-10-CM

## 2024-03-01 DIAGNOSIS — I10 Essential (primary) hypertension: Secondary | ICD-10-CM | POA: Diagnosis not present

## 2024-03-01 DIAGNOSIS — N1330 Unspecified hydronephrosis: Secondary | ICD-10-CM | POA: Diagnosis not present

## 2024-03-01 DIAGNOSIS — C771 Secondary and unspecified malignant neoplasm of intrathoracic lymph nodes: Secondary | ICD-10-CM | POA: Diagnosis not present

## 2024-03-01 HISTORY — DX: Personal history of diseases of the blood and blood-forming organs and certain disorders involving the immune mechanism: Z86.2

## 2024-03-01 HISTORY — DX: Squamous cell carcinoma of skin, unspecified: C44.92

## 2024-03-01 HISTORY — DX: Endometriosis, unspecified: N80.9

## 2024-03-01 HISTORY — DX: Migraine, unspecified, not intractable, without status migrainosus: G43.909

## 2024-03-01 HISTORY — DX: Essential (primary) hypertension: I10

## 2024-03-01 HISTORY — PX: CYSTOSCOPY WITH BIOPSY: SHX5122

## 2024-03-01 HISTORY — PX: CYSTOSCOPY WITH URETEROSCOPY AND STENT PLACEMENT: SHX6377

## 2024-03-01 HISTORY — DX: Presence of dental prosthetic device (complete) (partial): Z97.2

## 2024-03-01 LAB — CBC
HCT: 41.7 % (ref 36.0–46.0)
Hemoglobin: 13.5 g/dL (ref 12.0–15.0)
MCH: 29.7 pg (ref 26.0–34.0)
MCHC: 32.4 g/dL (ref 30.0–36.0)
MCV: 91.9 fL (ref 80.0–100.0)
Platelets: 291 K/uL (ref 150–400)
RBC: 4.54 MIL/uL (ref 3.87–5.11)
RDW: 14.2 % (ref 11.5–15.5)
WBC: 10.4 K/uL (ref 4.0–10.5)
nRBC: 0 % (ref 0.0–0.2)

## 2024-03-01 LAB — BASIC METABOLIC PANEL WITH GFR
Anion gap: 15 (ref 5–15)
BUN: 13 mg/dL (ref 8–23)
CO2: 23 mmol/L (ref 22–32)
Calcium: 9.7 mg/dL (ref 8.9–10.3)
Chloride: 100 mmol/L (ref 98–111)
Creatinine, Ser: 0.74 mg/dL (ref 0.44–1.00)
GFR, Estimated: >60 mL/min (ref >60)
Glucose, Bld: 99 mg/dL (ref 70–99)
Potassium: 3.7 mmol/L (ref 3.5–5.1)
Sodium: 138 mmol/L (ref 135–145)

## 2024-03-01 SURGERY — CYSTOSCOPY, WITH BIOPSY
Anesthesia: General | Laterality: Right

## 2024-03-01 MED ORDER — FENTANYL CITRATE (PF) 100 MCG/2ML IJ SOLN
INTRAMUSCULAR | Status: AC
  Filled 2024-03-01: qty 2

## 2024-03-01 MED ORDER — HYDROMORPHONE HCL 1 MG/ML IJ SOLN: 0.2500 mg | INTRAMUSCULAR | Status: DC | PRN

## 2024-03-01 MED ORDER — MIDAZOLAM HCL 2 MG/2ML IJ SOLN
INTRAMUSCULAR | Status: AC
  Filled 2024-03-01: qty 2

## 2024-03-01 MED ORDER — PROPOFOL 10 MG/ML IV BOLUS
INTRAVENOUS | Status: DC | PRN
  Administered 2024-03-01: 120 mg via INTRAVENOUS

## 2024-03-01 MED ORDER — OXYCODONE HCL 5 MG/5ML PO SOLN: 5.0000 mg | Freq: Once | ORAL | Status: DC | PRN

## 2024-03-01 MED ORDER — LACTATED RINGERS IV SOLN: INTRAVENOUS | Status: DC

## 2024-03-01 MED ORDER — FENTANYL CITRATE (PF) 250 MCG/5ML IJ SOLN
INTRAMUSCULAR | Status: DC | PRN
  Administered 2024-03-01: 100 ug via INTRAVENOUS

## 2024-03-01 MED ORDER — DEXAMETHASONE SODIUM PHOSPHATE 10 MG/ML IJ SOLN
INTRAMUSCULAR | Status: DC | PRN
  Administered 2024-03-01: 10 mg via INTRAVENOUS

## 2024-03-01 MED ORDER — CHLORHEXIDINE GLUCONATE 0.12 % MT SOLN
15.0000 mL | Freq: Once | OROMUCOSAL | Status: AC
  Administered 2024-03-01: 15 mL via OROMUCOSAL

## 2024-03-01 MED ORDER — EPHEDRINE 5 MG/ML INJ
INTRAVENOUS | Status: AC
  Filled 2024-03-01: qty 5

## 2024-03-01 MED ORDER — ONDANSETRON HCL 4 MG/2ML IJ SOLN
INTRAMUSCULAR | Status: DC | PRN
  Administered 2024-03-01: 4 mg via INTRAVENOUS

## 2024-03-01 MED ORDER — SODIUM CHLORIDE 0.9 % IR SOLN
Status: DC | PRN
  Administered 2024-03-01: 3000 mL via INTRAVESICAL

## 2024-03-01 MED ORDER — DROPERIDOL 2.5 MG/ML IJ SOLN: 0.6250 mg | Freq: Once | INTRAMUSCULAR | Status: DC | PRN

## 2024-03-01 MED ORDER — PHENYLEPHRINE HCL (PRESSORS) 10 MG/ML IV SOLN
INTRAVENOUS | Status: DC | PRN
  Administered 2024-03-01: 200 ug via INTRAVENOUS

## 2024-03-01 MED ORDER — OXYCODONE HCL 5 MG PO TABS: 5.0000 mg | ORAL_TABLET | Freq: Once | ORAL | Status: DC | PRN

## 2024-03-01 MED ORDER — PHENAZOPYRIDINE HCL 200 MG PO TABS: 200.0000 mg | ORAL_TABLET | Freq: Three times a day (TID) | ORAL | 0 refills | Status: DC | PRN

## 2024-03-01 MED ORDER — LIDOCAINE HCL (PF) 2 % IJ SOLN
INTRAMUSCULAR | Status: AC
  Filled 2024-03-01: qty 5

## 2024-03-01 MED ORDER — MIDAZOLAM HCL 2 MG/2ML IJ SOLN
INTRAMUSCULAR | Status: DC | PRN
  Administered 2024-03-01: 2 mg via INTRAVENOUS

## 2024-03-01 MED ORDER — IOHEXOL 300 MG/ML  SOLN
INTRAMUSCULAR | Status: DC | PRN
  Administered 2024-03-01: 10 mL via URETHRAL

## 2024-03-01 MED ORDER — EPHEDRINE SULFATE (PRESSORS) 50 MG/ML IJ SOLN
INTRAMUSCULAR | Status: DC | PRN
  Administered 2024-03-01: 10 mg via INTRAVENOUS

## 2024-03-01 MED ORDER — PROPOFOL 10 MG/ML IV BOLUS
INTRAVENOUS | Status: AC
  Filled 2024-03-01: qty 20

## 2024-03-01 MED ORDER — ORAL CARE MOUTH RINSE: 15.0000 mL | Freq: Once | OROMUCOSAL | Status: AC

## 2024-03-01 MED ORDER — CEFAZOLIN SODIUM-DEXTROSE 2-4 GM/100ML-% IV SOLN
2.0000 g | INTRAVENOUS | Status: AC
Start: 2024-03-01 — End: 2024-03-01
  Administered 2024-03-01: 2 g via INTRAVENOUS
  Filled 2024-03-01: qty 100

## 2024-03-01 MED ORDER — ONDANSETRON HCL 4 MG/2ML IJ SOLN: 4.0000 mg | Freq: Once | INTRAMUSCULAR | Status: DC | PRN

## 2024-03-01 MED ORDER — LIDOCAINE 2% (20 MG/ML) 5 ML SYRINGE
INTRAMUSCULAR | Status: DC | PRN
  Administered 2024-03-01: 100 mg via INTRAVENOUS

## 2024-03-01 MED ORDER — DEXMEDETOMIDINE HCL IN NACL 80 MCG/20ML IV SOLN
INTRAVENOUS | Status: DC | PRN
  Administered 2024-03-01: 8 ug via INTRAVENOUS
  Administered 2024-03-01: 10 ug via INTRAVENOUS

## 2024-03-01 MED ORDER — OXYBUTYNIN CHLORIDE 5 MG PO TABS: 5.0000 mg | ORAL_TABLET | Freq: Three times a day (TID) | ORAL | 1 refills | Status: DC | PRN

## 2024-03-01 SURGICAL SUPPLY — 24 items
BAG URINE DRAIN 2000ML AR STRL (UROLOGICAL SUPPLIES) IMPLANT
BAG URO CATCHER STRL LF (MISCELLANEOUS) ×1 IMPLANT
BASKET ZERO TIP NITINOL 2.4FR (BASKET) IMPLANT
CATH URETL OPEN 5X70 (CATHETERS) ×1 IMPLANT
CLOTH BEACON ORANGE TIMEOUT ST (SAFETY) ×1 IMPLANT
DRAPE FOOT SWITCH (DRAPES) ×1 IMPLANT
ELECT REM PT RETURN 15FT ADLT (MISCELLANEOUS) ×1 IMPLANT
EXTRACTOR STONE NITINOL NGAGE (UROLOGICAL SUPPLIES) IMPLANT
FIBER LASER MOSES 200 DFL (Laser) IMPLANT
GLOVE SURG LX STRL 8.0 MICRO (GLOVE) ×1 IMPLANT
GOWN STRL SURGICAL XL XLNG (GOWN DISPOSABLE) ×1 IMPLANT
GUIDEWIRE STR DUAL SENSOR (WIRE) IMPLANT
GUIDEWIRE ZIPWRE .038 STRAIGHT (WIRE) ×1 IMPLANT
KIT TURNOVER KIT A (KITS) ×1 IMPLANT
LOOP CUT BIPOLAR 24F LRG (ELECTROSURGICAL) IMPLANT
MANIFOLD NEPTUNE II (INSTRUMENTS) ×1 IMPLANT
PACK CYSTO (CUSTOM PROCEDURE TRAY) ×1 IMPLANT
SHEATH NAVIGATOR HD 11/13X28 (SHEATH) IMPLANT
SHEATH NAVIGATOR HD 11/13X36 (SHEATH) IMPLANT
STENT URET 6FRX24 CONTOUR (STENTS) IMPLANT
STENT URET 6FRX26 CONTOUR (STENTS) IMPLANT
SYRINGE TOOMEY IRRIG 70ML (MISCELLANEOUS) IMPLANT
TUBING CONNECTING 10 (TUBING) ×1 IMPLANT
TUBING UROLOGY SET (TUBING) ×1 IMPLANT

## 2024-03-01 NOTE — Transfer of Care (Signed)
 Immediate Anesthesia Transfer of Care Note  Patient: Ann Lee  Procedure(s) Performed: CYSTOSCOPY, (Right) CYSTOURETEROSCOPY, WITH STENT INSERTION (Right)  Patient Location: PACU  Anesthesia Type:General  Level of Consciousness: awake and alert   Airway & Oxygen Therapy: Patient Spontanous Breathing and Patient connected to face mask oxygen  Post-op Assessment: Report given to RN and Post -op Vital signs reviewed and stable  Post vital signs: Reviewed and stable  Last Vitals:  Vitals Value Taken Time  BP 139/80 03/01/24 15:51  Temp    Pulse 95 03/01/24 15:53  Resp 19 03/01/24 15:53  SpO2 94 % 03/01/24 15:53  Vitals shown include unfiled device data.  Last Pain:  Vitals:   03/01/24 1217  TempSrc: Oral  PainSc:       Patients Stated Pain Goal: 3 (02/29/24 9062)  Complications: No notable events documented.

## 2024-03-01 NOTE — Telephone Encounter (Signed)
 Notified patient that she will not require surgery. Spoke with Dr. Carolynn today re: cysto results. OK to take 1/2 tablet for pain.

## 2024-03-01 NOTE — H&P (Signed)
 Office Visit Report     02/24/2024   --------------------------------------------------------------------------------   Ann Lee  MRN: [id number]  DOB: 17-Oct-1955, 68 year old Female  SSN:    PRIMARY CARE:  Wolm Scarlet, MD  PRIMARY CARE FAX:  [phone]  REFERRING:  Wolm Scarlet, MD  PROVIDER:  Lonni Han, M.D.  LOCATION:  [facility] [phone]     --------------------------------------------------------------------------------   CC/HPI: Right renal pelvis mass   Ann Lee is a 68 year old female with a solid and enhancing right renal pelvis mass measuring 1.8 cm along with widespread thoracic and retroperitoneal lymphadenopathy on PET/CT from 02/11/24. She has a biopsy scheduled today of her clavicular lymph node and is being followed by Arley Hof, MD. For the past several weeks, the patient reports constant, sharp right flank/lower back pain that radiates into her right hip along with abdominal bloating. She was noted to have moderate hydronephrosis extending down to the proximal ureter on CT and on her PET scan. She denies any prior episodes of gross hematuria or dysuria. She is a longtime 1/2 pack/day smoker for the past 40 years.     ALLERGIES: Iodine    MEDICATIONS: Advil   Amoxicillin   Imitrex  PRN  Tylenol      GU PSH: None   NON-GU PSH: Hernia Repair     GU PMH: No GU PMH      PMH Notes: Cardiovascular and Mediastinum  Migraine headache  AAA (abdominal aortic aneurysm) (HCC)  Essential hypertension   Respiratory  SINUSITIS, ACUTE  SORE THROAT   Nervous and Auditory  Herpes zoster   Musculoskeletal and Integument  Temporomandibular joint disorder  Rash  SCCA (squamous cell carcinoma) of skin   Other  NECK PAIN  PARESTHESIA  PERS HX TOBACCO USE PRESENTING HAZARDS HEALTH  Pain of left great toe  History of endometriosis      NON-GU PMH: No Non-GU PMH    FAMILY HISTORY: 1 Daughter - Daughter 3 Son's  - Son Colon Cancer - Father lymphoma - Mother throat cancer - Mother   SOCIAL HISTORY: Marital Status: Divorced Preferred Language: English; Race: White Current Smoking Status: Patient smokes.   Tobacco Use Assessment Completed: Used Tobacco in last 30 days? Has never drank.  Drinks 3 caffeinated drinks per day.    REVIEW OF SYSTEMS:    GU Review Female:   Patient denies frequent urination, hard to postpone urination, burning /pain with urination, get up at night to urinate, leakage of urine, stream starts and stops, trouble starting your stream, have to strain to urinate, and being pregnant.  Gastrointestinal (Upper):   Patient denies nausea, vomiting, and indigestion/ heartburn.  Gastrointestinal (Lower):   Patient denies diarrhea and constipation.  Constitutional:   Patient denies fever, night sweats, weight loss, and fatigue.  Skin:   Patient denies skin rash/ lesion and itching.  Eyes:   Patient denies blurred vision and double vision.  Ears/ Nose/ Throat:   Patient denies sore throat and sinus problems.  Hematologic/Lymphatic:   Patient denies swollen glands and easy bruising.  Cardiovascular:   Patient denies leg swelling and chest pains.  Respiratory:   Patient denies cough and shortness of breath.  Endocrine:   Patient denies excessive thirst.  Musculoskeletal:   Patient denies joint pain and back pain.  Neurological:   Patient denies headaches and dizziness.  Psychologic:   Patient denies depression and anxiety.   VITAL SIGNS:      02/24/2024 08:02 AM  Weight 128 lb / 58.06  kg  Height 63 in / 160.02 cm  BP 130/75 mmHg  Pulse 77 /min  BMI 22.7 kg/m   MULTI-SYSTEM PHYSICAL EXAMINATION:    Constitutional: Well-nourished. No physical deformities. Normally developed. Good grooming.  Respiratory: No labored breathing, no use of accessory muscles.   Neurologic / Psychiatric: Oriented to time, oriented to place, oriented to person. No depression, no anxiety, no agitation.      Complexity of Data:  Lab Test Review:   BMP  Records Review:   Previous Hospital Records, Previous Patient Records  X-Ray Review: PET Scan: Reviewed Films. Reviewed Report. Discussed With Patient. CLINICAL DATA: Initial treatment strategy for right renal mass.  Unintentional weight loss.   EXAM:  NUCLEAR MEDICINE PET SKULL BASE TO THIGH   TECHNIQUE:  6.3 mCi F-18 FDG was injected intravenously. Full-ring PET imaging  was performed from the skull base to thigh after the radiotracer. CT  data was obtained and used for attenuation correction and anatomic  localization.   Fasting blood glucose: 107 mg/dl   COMPARISON: CT scans from 02/08/2024 and 02/01/2024   FINDINGS:  Mediastinal blood pool activity: SUV max 2.5   Liver activity: SUV max NA   NECK: No significant abnormal hypermetabolic activity in this  region.   Incidental CT findings: None.   CHEST: Clustered left supraclavicular nodes measuring up to 0.9 cm  in short axis with maximum SUV 13.2, compatible with malignancy.   AP window lymph node 1.3 cm in short axis on image 61 series 4,  maximum SUV 14.6 compatible with malignancy.   Incidental CT findings: Aortic and branch vessel atheromatous  vascular disease. Biapical pleuroparenchymal scarring. Centrilobular  emphysema.   ABDOMEN/PELVIS: Hypermetabolic retrocrural, right gastric,  retroperitoneal adenopathy compatible with malignancy.  Retroperitoneal adenopathy anterior to the abdominal aorta measures  1.9 cm in short axis on image 131 series 4 with maximum SUV 16.5.   The patient has a known right kidney lower pole mass along the  medullary pyramid/calyx anteriorly based on recent CT examination,  this mass is not readily differential from surrounding activity in  the distended right collecting system. Right hydroureter extends to  the iliac vessel cross over, tumor in the ureter not excluded.   Incidental CT findings: Moderate right hydronephrosis and  proximal  hydroureter. Sigmoid colon diverticulosis.   SKELETON: Solitary skeletal hypermetabolic lesion in the vicinity of  the right anterior S1 sacral foramen, probably in the bone just  anterior to the foramen, maximum SUV 13.5 compatible with  malignancy.   Incidental CT findings: None.   IMPRESSION:  1. Hypermetabolic left supraclavicular, AP window, retrocrural,  right gastric, and retroperitoneal adenopathy compatible with  malignancy.  2. Solitary hypermetabolic skeletal lesion in the vicinity of the  right anterior S1 sacral foramen, probably in the bone just anterior  to the foramen, maximum SUV 13.5 compatible with malignancy.  3. The patient has a known right kidney lower pole mass along the  medullary pyramid/calyx anteriorly based on recent CT examination,  this mass is not readily differential from surrounding activity in  the distended right collecting system. Right hydroureter extends to  the iliac vessel cross over, tumor in the ureter not excluded.  4. Moderate right hydronephrosis and proximal hydroureter.  5. Sigmoid colon diverticulosis.  6. Aortic and branch vessel atheromatous vascular disease.  7. Aortic Atherosclerosis (ICD10-I70.0) and Emphysema (ICD10-J43.9).    Electronically Signed  By: Ryan Salvage M.D.  On: 02/11/2024 08:54   C.T. Abdomen/Pelvis: Reviewed Films. Reviewed Report. Discussed With  Patient.    Notes:                     CLINICAL DATA: Severe lower back and pelvic pain. Changes in size  and in shape of BM.   EXAM:  CT ABDOMEN AND PELVIS WITHOUT CONTRAST   TECHNIQUE:  Multidetector CT imaging of the abdomen and pelvis was performed  following the standard protocol without IV contrast.   RADIATION DOSE REDUCTION: This exam was performed according to the  departmental dose-optimization program which includes automated  exposure control, adjustment of the mA and/or kV according to  patient size and/or use of iterative  reconstruction technique.   COMPARISON: January 16, 2024   FINDINGS:  Of note, the lack of intravenous contrast limits evaluation of the  solid organ parenchyma and vascularity.   Lower chest: No focal airspace consolidation or pleural effusion.   Hepatobiliary: No mass.Focal fatty infiltration along the falciform  ligament.No radiopaque stones or wall thickening of the gallbladder.  No intrahepatic or extrahepatic biliary ductal dilation.   Pancreas: No mass or main ductal dilation. No peripancreatic  inflammation or fluid collection.   Spleen: Normal size. No mass.   Adrenals/Urinary Tract: No adrenal masses. Ovoid hyperdensity along  the right lower pole of the kidney measuring 1.8 cm in transverse  dimension (axial 46). Mild right-sided hydronephrosis extending to  the mid to distal ureter. No obstructive ureteral calculi present,  but perinephric and periureteric stranding is present. The urinary  bladder is distended without focal abnormality.   Stomach/Bowel: The stomach is decompressed without focal  abnormality. No small bowel wall thickening or inflammation. No  small bowel obstruction.No extravasation of enteric contrast to  suggest bowel perforation. The enteric contrast is present to the  level of the distal sigmoid colon.Normal appendix. Sigmoid colonic  diverticulosis. No changes of acute diverticulitis.   Vascular/Lymphatic: No aortic aneurysm. Diffuse aortoiliac  atherosclerosis. Multiple enlarged retroperitoneal lymph nodes  involving the periaortic and aortocaval spaces. For example, there  is an enlarged lymph node in the anterior aortocaval region  measuring 1.3 cm (axial 34). A second aortocaval periaortic lymph  node measures 1.5 cm (axial 40). 1.2 cm periaortic lymph node (axial  36). The remainder of the periaortic lymph nodes measure up to 9 mm.   Reproductive: Age-related atrophy of the uterus and ovaries. No  concerning adnexal mass. No free pelvic  fluid.   Other: No pneumoperitoneum or ascites.   Musculoskeletal: No acute fracture or destructive lesion. Multilevel  degenerative disc disease of the spine.   IMPRESSION:  1. Mild right-sided hydroureteronephrosis extending to the mid to  distal ureter. No obstructive ureteral calculi present. This could  represent changes from a recently passed calculus versus an  ascending urinary tract infection. Correlation with urinalysis is  recommended.  2. Multiple enlarged retroperitoneal lymph nodes involving the  periaortic and aortocaval spaces. For example, the largest lymph  node measures 1.5 cm in short axis dimension (axial 40). These are  worrisome for either metastatic disease or lymphoma.  3. Ovoid hyperdensity along the lower pole of the right kidney,  measuring 1.8 cm, possibly representing blood within a renal calyx  versus a renal or urothelial neoplasm. CT urogram recommended.   These results will be called to the ordering clinician or  representative by the Radiologist Assistant and communication  documented in the PACS or [employer].   Aortic Atherosclerosis (ICD10-I70.0).    Electronically Signed  By: Rogelia Myers M.D.  On: 02/01/2024  13:58      PROCEDURES:          Urinalysis w/Scope Dipstick Dipstick Cont'd Micro  Color: Yellow Bilirubin: Neg mg/dL WBC/hpf: 40 - 39/yeq  Appearance: Slightly Cloudy Ketones: Neg mg/dL RBC/hpf: 3 - 89/yeq  Specific Gravity: 1.020 Blood: 2+ ery/uL Bacteria: Many (>50/hpf)  pH: 6.0 Protein: 1+ mg/dL Cystals: NS (Not Seen)  Glucose: Neg mg/dL Urobilinogen: 0.2 mg/dL Casts: NS (Not Seen)    Nitrites: Neg Trichomonas: Not Present    Leukocyte Esterase: 3+ leu/uL Mucous: Not Present      Epithelial Cells: 0 - 5/hpf      Yeast: NS (Not Seen)      Sperm: Not Present    ASSESSMENT:      ICD-10 Details  1 GU:   Right renal neoplasm - D49.511 Undiagnosed New Problem  2   Flank Pain - R10.84 Undiagnosed New Problem  4    Hydronephrosis - N13.0 Right, Undiagnosed New Problem  3 NON-GU:   Metastatic lymphadenopathy of multiple regions - C77.8 Undiagnosed New Problem   PLAN:           Orders Labs Urine Cytology, Urine Culture          Schedule Return Visit/Planned Activity: Next Available Appointment - Schedule Surgery          Document Letter(s):  Created for Wolm Scarlet, MD   Created for Patient: Clinical Summary         Notes:    - Imaging results and prognosis discussed extensively with the patient and her sister. Will plan for cystoscopy with diagnostic right ureteroscopy and ureteral stent placement ASAP. My suspicion is that this is a urothelial carcinoma with metastasis and she will need to maintain as much renal function as possible to undergo chemotherapy. The preemptive right nephroureterectomy is not recommended at this juncture. The risk, benefits and alternatives of the above procedure was discussed in detail.

## 2024-03-01 NOTE — Anesthesia Postprocedure Evaluation (Signed)
 Anesthesia Post Note  Patient: Ann Lee  Procedure(s) Performed: CYSTOSCOPY, (Right) CYSTOURETEROSCOPY, WITH STENT INSERTION (Right)     Patient location during evaluation: PACU Anesthesia Type: General Level of consciousness: awake and alert and oriented Pain management: pain level controlled Vital Signs Assessment: post-procedure vital signs reviewed and stable Respiratory status: spontaneous breathing, nonlabored ventilation and respiratory function stable Cardiovascular status: blood pressure returned to baseline and stable Postop Assessment: no apparent nausea or vomiting Anesthetic complications: no   No notable events documented.  Last Vitals:  Vitals:   03/01/24 1600 03/01/24 1615  BP: 132/79 128/85  Pulse: 89 81  Resp: 17 16  Temp:    SpO2: 94% 93%    Last Pain:  Vitals:   03/01/24 1615  TempSrc:   PainSc: 4                  [name] A.

## 2024-03-01 NOTE — Anesthesia Procedure Notes (Signed)
 Procedure Name: LMA Insertion Date/Time: 03/01/2024 3:08 PM  Performed by: Nanci Riis, CRNAPre-anesthesia Checklist: Patient identified, Emergency Drugs available, Suction available, Patient being monitored and Timeout performed Patient Re-evaluated:Patient Re-evaluated prior to induction Oxygen Delivery Method: Circle system utilized Preoxygenation: Pre-oxygenation with 100% oxygen Induction Type: IV induction LMA: LMA inserted LMA Size: 3.0 Number of attempts: 1 Placement Confirmation: breath sounds checked- equal and bilateral and positive ETCO2 Tube secured with: Tape Dental Injury: Teeth and Oropharynx as per pre-operative assessment

## 2024-03-01 NOTE — Op Note (Addendum)
 Operative Note  Preoperative diagnosis:  1.  1.8 cm right renal pelvis mass 2.  Right hydronephrosis 3.  Widespread thoracic and retroperitoneal lymphadenopathy with biopsy-proven metastatic urothelial carcinoma  Postoperative diagnosis: 1.  1.8 cm right papillary renal pelvis mass with features consistent with urothelial carcinoma (photos in EPIC) 2.  No evidence of high-grade obstruction of the right kidney 3.  Widespread thoracic and retroperitoneal lymphadenopathy with biopsy-proven metastatic urothelial carcinoma  Procedure(s): 1.  Cystoscopy with diagnostic right ureteroscopy and right ureteral stent placement 2.  Right retrograde pyelogram with intraoperative interpretation of fluoroscopic imaging  Surgeon: Lonni Han, MD  Assistants:  None  Anesthesia:  General  Complications:  None  EBL: Less than 5 mL  Specimens: 1.  Right renal pelvis washings  Drains/Catheters: 1.  Right 6 French, 24 cm JJ stent without tether  Intraoperative findings:   No intravesical or urethral abnormalities were seen Solitary right collecting system with no filling defects or dilation involving the right ureter or right renal pelvis seen on retrograde pyelogram Papillary tumor involving the right mid pole calyx with features concerning for urothelial carcinoma  Indication:  Ann Lee is a 68 y.o. female with metastatic urothelial carcinoma right-sided flank pain and right-sided hydronephrosis on her initial diagnostic imaging.  She has been consented for the above procedures, voices understanding and wishes to proceed.  Description of procedure:  After informed consent was obtained, the patient was brought to the operating room and general LMA anesthesia was administered. The patient was then placed in the dorsolithotomy position and prepped and draped in the usual sterile fashion. A timeout was performed. A 21 French rigid cystoscope was then inserted into the urethral meatus  and advanced into the bladder under direct vision. A complete bladder survey revealed no intravesical pathology.  A 5 French ureteral catheter was then inserted into the right ureteral orifice and a retrograde pyelogram was obtained, with the findings listed above.  A Glidewire was then used to intubate the lumen of the ureteral catheter and was advanced up to the right renal pelvis, under fluoroscopic guidance.  The catheter was then removed, leaving the wire in place.  A flexible ureteroscope was then advanced over the wire and up to the right renal pelvis where a papillary tumor was seen involving a midpole calyx.  Washings were obtained from the lumen of the right renal pelvis and sent for cytology.  A Glidewire was then readvanced through the flexible ureteroscope and into an upper pole calyx.  The flexible ureteroscope was then removed, leaving the wire in place.  A 6 French, 24 cm JJ stent was then placed over the wire and into good position within the right collecting system, confirming placement via fluoroscopy.  The patient's bladder was drained.  She tolerated the procedure well and was transferred to the postanesthesia unit in stable condition.  Plan: Tentatively plan to have her follow-up in 3 months for possible stent exchange as she has a treatment plan formulated with her oncology team.  CC: Arley Hof, MD

## 2024-03-01 NOTE — Anesthesia Preprocedure Evaluation (Signed)
 Anesthesia Evaluation  Patient identified by MRN, date of birth, ID band Patient awake    Reviewed: Allergy & Precautions, NPO status , Patient's Chart, lab work & pertinent test results  Airway Mallampati: II  TM Distance: >3 FB     Dental no notable dental hx. (+) Caps, Dental Advisory Given   Pulmonary Current Smoker and Patient abstained from smoking.   breath sounds clear to auscultation       Cardiovascular hypertension, Pt. on medications Normal cardiovascular exam Rhythm:Regular Rate:Normal     Neuro/Psych  Headaches  negative psych ROS   GI/Hepatic negative GI ROS, Neg liver ROS,,,  Endo/Other  negative endocrine ROS    Renal/GU Right renal pelvis mass  negative genitourinary   Musculoskeletal negative musculoskeletal ROS (+)    Abdominal   Peds  Hematology negative hematology ROS (+)   Anesthesia Other Findings   Reproductive/Obstetrics                              Anesthesia Physical Anesthesia Plan  ASA: 2  Anesthesia Plan: General   Post-op Pain Management: Minimal or no pain anticipated   Induction: Intravenous  PONV Risk Score and Plan: 4 or greater and Treatment may vary due to age or medical condition, Midazolam , Ondansetron  and Dexamethasone   Airway Management Planned: LMA  Additional Equipment: None  Intra-op Plan:   Post-operative Plan: Extubation in OR  Informed Consent: I have reviewed the patients History and Physical, chart, labs and discussed the procedure including the risks, benefits and alternatives for the proposed anesthesia with the patient or authorized representative who has indicated his/her understanding and acceptance.     Dental advisory given  Plan Discussed with: CRNA and Anesthesiologist  Anesthesia Plan Comments:          Anesthesia Quick Evaluation

## 2024-03-02 ENCOUNTER — Inpatient Hospital Stay (HOSPITAL_BASED_OUTPATIENT_CLINIC_OR_DEPARTMENT_OTHER): Admitting: Nurse Practitioner

## 2024-03-02 ENCOUNTER — Other Ambulatory Visit: Payer: Self-pay

## 2024-03-02 ENCOUNTER — Encounter: Payer: Self-pay | Admitting: *Deleted

## 2024-03-02 ENCOUNTER — Encounter (HOSPITAL_COMMUNITY): Payer: Self-pay | Admitting: Urology

## 2024-03-02 VITALS — BP 128/85 | HR 70 | Temp 98.5°F | Resp 18 | Ht 63.0 in | Wt 124.9 lb

## 2024-03-02 DIAGNOSIS — C651 Malignant neoplasm of right renal pelvis: Secondary | ICD-10-CM | POA: Diagnosis not present

## 2024-03-02 DIAGNOSIS — C770 Secondary and unspecified malignant neoplasm of lymph nodes of head, face and neck: Secondary | ICD-10-CM | POA: Diagnosis not present

## 2024-03-02 DIAGNOSIS — C641 Malignant neoplasm of right kidney, except renal pelvis: Secondary | ICD-10-CM | POA: Diagnosis not present

## 2024-03-02 DIAGNOSIS — C689 Malignant neoplasm of urinary organ, unspecified: Secondary | ICD-10-CM

## 2024-03-02 DIAGNOSIS — R112 Nausea with vomiting, unspecified: Secondary | ICD-10-CM | POA: Diagnosis not present

## 2024-03-02 DIAGNOSIS — Z5112 Encounter for antineoplastic immunotherapy: Secondary | ICD-10-CM | POA: Diagnosis not present

## 2024-03-02 DIAGNOSIS — M899 Disorder of bone, unspecified: Secondary | ICD-10-CM | POA: Diagnosis not present

## 2024-03-02 DIAGNOSIS — Z79620 Long term (current) use of immunosuppressive biologic: Secondary | ICD-10-CM | POA: Diagnosis not present

## 2024-03-02 DIAGNOSIS — R634 Abnormal weight loss: Secondary | ICD-10-CM | POA: Diagnosis not present

## 2024-03-02 DIAGNOSIS — Z720 Tobacco use: Secondary | ICD-10-CM | POA: Diagnosis not present

## 2024-03-02 DIAGNOSIS — R319 Hematuria, unspecified: Secondary | ICD-10-CM | POA: Diagnosis not present

## 2024-03-02 MED ORDER — PROCHLORPERAZINE MALEATE 5 MG PO TABS: 5.0000 mg | ORAL_TABLET | Freq: Four times a day (QID) | ORAL | 2 refills | Status: DC | PRN

## 2024-03-02 MED ORDER — HYDROMORPHONE HCL 2 MG PO TABS: 1.0000 mg | ORAL_TABLET | Freq: Four times a day (QID) | ORAL | 0 refills | Status: DC | PRN

## 2024-03-02 NOTE — Progress Notes (Signed)
 [facility] OFFICE PROGRESS NOTE   Diagnosis: Right renal mass  INTERVAL HISTORY:   Ann Lee returns as scheduled.  She underwent biopsy of a left supraclavicular lymph node 02/24/2024.  Pathology showed metastatic carcinoma compatible with clinical suspicion of metastatic urothelial carcinoma.  On 03/01/2024 she underwent cystoscopy with right ureteral stent placement.  No intravesical or urethral abnormality seen; solitary right collecting system with no filling defects or dilation involving the right ureter or right renal pelvis; papillary tumor involving the right mid pole calyx with features concerning for urothelial carcinoma.  She continues to have back pain.  She is taking Tylenol  or Advil  which provides partial relief.  She was unable to tolerate Vicodin due to nausea, jitteriness.  No hematuria.  Urine is discolored which she attributes to Pyridium .  Objective:  Vital signs in last 24 hours:  Blood pressure 128/85, pulse 70, temperature 98.5 F (36.9 C), temperature source Temporal, resp. rate 18, height 5' 3 (1.6 m), weight 124 lb 14.4 oz (56.7 kg), SpO2 93%.     Resp: Lungs clear bilaterally. Cardio: Regular rate and rhythm. GI: No hepatosplenomegaly. Vascular: No leg edema.   Lab Results:  Lab Results  Component Value Date   WBC 10.4 03/01/2024   HGB 13.5 03/01/2024   HCT 41.7 03/01/2024   MCV 91.9 03/01/2024   PLT 291 03/01/2024   NEUTROABS 5.4 01/03/2024    Imaging:  DG C-Arm 1-60 Min-No Report Result Date: 03/01/2024 Fluoroscopy was utilized by the requesting physician.  No radiographic interpretation.    Medications: I have reviewed the patient's current medications.  Assessment/Plan: Right renal mass CTs abdomen/pelvis 02/01/2024-mild right-sided hydroureteronephrosis extending to the mid to distal ureter; multiple enlarged retroperitoneal lymph nodes involving the periaortic and aortocaval spaces; ovoid hyperdensity along the lower  pole of the right kidney measuring 1.8 cm.   CT renal 02/08/2024-1.6 x 1.7 x 2.0 cm hyperattenuating moderately enhancing mass in the right kidney lower pole calyx with subtle extension of tumor into the anteroinferior wall of the right renal pelvis; moderate right hydronephrosis and hydroureter up to the level of the right mid ureter; additional linear bandlike enhancing area in the right upper ureter; extensive heterogeneous retroperitoneal lymphadenopathy.   PET scan 02/11/2024-hypermetabolic left supraclavicular, AP window, retrocrural, right gastric and retroperitoneal adenopathy; solitary hypermetabolic skeletal lesion in the vicinity of the right anterior S1 sacral foramen. Biopsy left supraclavicular lymph node 02/24/2024-metastatic carcinoma compatible with clinical suspicion of metastatic urothelial carcinoma Cystoscopy/right ureter stent placement 03/01/2024-No intravesical or urethral abnormality seen; solitary right collecting system with no filling defects or dilation involving the right ureter or right renal pelvis; papillary tumor involving the right mid pole calyx with features concerning for urothelial carcinoma. Plan for Pembrolizumab and enfortumab Back pain secondary to #1 Change in bowel habits, postprandial abdominal pain Emphysema Tobacco use  Disposition: Ann Lee appears stable.  We reviewed the left supraclavicular lymph node biopsy result with her and her family.  They understand pathology shows metastatic carcinoma compatible with metastatic urothelial carcinoma.  They further understand no therapy will be curative.  Dr. Cloretta recommends beginning treatment with Pembrolizumab and enfortumab.  We reviewed potential toxicities associated with Pembrolizumab including rash, diarrhea, endocrinopathies, myocarditis, pneumonitis, hepatitis, nephritis, arthritis, neurologic toxicity.  We reviewed potential side effects associated with enfortumab including bone marrow toxicity,  infection, bleeding, rash (potentially severe), hyperglycemia, neuropathy, pneumonitis, eye toxicity.  She understands Enfortumab is an irritant and a Port-A-Cath is recommended.  She agrees to proceed.  She was  provided with printed information on both pembrolizumab and enfortumab.  She will attend an education class.  We referred her for Port-A-Cath placement.  The back pain is likely due to retroperitoneal adenopathy.  She did not tolerate Vicodin due to nausea and jitteriness.  She will try hydromorphone  1 to 2 mg every 6 hours as needed.  Anticipate she will begin cycle 1 in approximately 1 week.  We will see her prior to the day 8 Enfortumab.   Patient seen with Dr. Cloretta.   Ann Lee ANP/GNP-BC   03/02/2024  2:35 PM  This was a shared visit with Ann Lee.  Ann Lee has been diagnosed with urothelial carcinoma of the right renal pelvis.  We reviewed the 03/02/2024 ureteroscopy finding and the pathology result from the left supraclavicular lymph node biopsy.  She has been diagnosed with metastatic urothelial carcinoma.  We discussed the prognosis and treatment options.  She understands the goals of treatment are to palliate symptoms and extend survival.  It is unlikely that treatment will be curative.  I recommend enfortumab/pembrolizumab.  We reviewed potential toxicities associated with this regimen.  She agrees to proceed.  She will attend a chemotherapy teaching class.  She will begin a trial of hydromorphone  for pain.  A treatment plan was entered today.  I was present for greater than 50% of today's visit.  I performed medical decision making.  Ann Cloretta, MD

## 2024-03-02 NOTE — Progress Notes (Signed)
 Requested stains on the 02/24/2024 left supraclavicular lymph node biopsy to confirm urothelial malignancy accession number [id number] through [facility]

## 2024-03-02 NOTE — Progress Notes (Signed)
 START OFF PATHWAY REGIMEN - Other   [id number]:Enfortumab vedotin 1.25 mg/kg IV D1,8 + Pembrolizumab 200 mg IV D1 q21 Days:   A cycle is every 21 days:     Enfortumab vedotin-ejfv      Pembrolizumab   **Always confirm dose/schedule in your pharmacy ordering system**  Patient Characteristics: Intent of Therapy: Non-Curative / Palliative Intent, Discussed with Patient

## 2024-03-03 ENCOUNTER — Other Ambulatory Visit: Payer: Self-pay

## 2024-03-03 ENCOUNTER — Telehealth: Payer: Self-pay

## 2024-03-03 NOTE — Discharge Instructions (Signed)
 Implanted Port Insertion After Care       After the procedure, it is common to have discomfort at the port insertion site, as well as bruising on the skin over the port. This should improve over 3-4 days.    After your port is placed, you will get a manufacturer's information card. The card has information about your port. Keep this card with you at all times.      Follow instructions from your health care provider about how to take care of your port insertion site. Make sure you wash your hands with soap and water for at least 20 seconds before and after you change your bandage (dressing). If soap and water are not available, use hand sanitizer.  Leave your initial bandage on for a full 24 hours. After 24 hours you may remove the dressing and shower.  Do not scrub directly on the incision site but rather above it and let the soapy water run over the incision. Pat dry after. You may then opt to redress your incision for your comfort but you may just leave it open to air.  The Dermabond (surgical super glue) will protect your incision and keep it clean and dry. Leave the layer of skin glue in place. If the glue edges start to loosen and curl up, you may trim the loose edges but do not pull or pick at it. Do NOT apply neosporin or other antibacterial ointment to the surgical glue.  It will dissolve the glue and expose your new incision to possible infection.  Do NOT apply EMLA  numbing cream to the surgical glue.  It will dissolve the glue and expose your new incision to possible infection.  You may have to wait to use the EMLA  cream until your incision has healed.    Return to your normal activities as told by your health care provider. Ask your health care provider what activities are safe for you. You may resume over-the-counter and prescription medicines as prescribed by your health care provider. Do not take baths, swim, or use a hot tub until your incision has healed completely (usually 2 weeks).      You were given a sedative during the procedure, and it can affect you for several hours. Do not drive, operate machinery or sign important documents for 24 hours after your procedure.     Please contact our office at [phone] and ask to speak with the nurse if you have any signs of an infection, such as fever or chills, redness, swelling, or pain around your port insertion site, fluid or blood coming from your port insertion site, if your port insertion site feels warm to the touch and/or if you have pus or a bad smell coming from the port insertion site.       If you need to speak to someone after hours, please call the Interventional Radiology on-call service at [phone] and tell them you are a patient of Dr. Terrill and you had a portacath placed today, as well as any issues you are having.    Thank you for visiting [facility] today!

## 2024-03-03 NOTE — Progress Notes (Signed)
 See telephone note

## 2024-03-05 ENCOUNTER — Other Ambulatory Visit: Payer: Self-pay | Admitting: Oncology

## 2024-03-06 ENCOUNTER — Ambulatory Visit
Admission: RE | Admit: 2024-03-06 | Discharge: 2024-03-06 | Disposition: A | Discharge status: Home / Self Care | Source: Ambulatory Visit | Attending: Nurse Practitioner | Admitting: Nurse Practitioner

## 2024-03-06 ENCOUNTER — Telehealth: Payer: Self-pay | Admitting: Gastroenterology

## 2024-03-06 ENCOUNTER — Other Ambulatory Visit: Payer: Self-pay | Admitting: Nurse Practitioner

## 2024-03-06 DIAGNOSIS — C689 Malignant neoplasm of urinary organ, unspecified: Secondary | ICD-10-CM

## 2024-03-06 DIAGNOSIS — C679 Malignant neoplasm of bladder, unspecified: Secondary | ICD-10-CM | POA: Diagnosis not present

## 2024-03-06 DIAGNOSIS — Z452 Encounter for adjustment and management of vascular access device: Secondary | ICD-10-CM | POA: Diagnosis not present

## 2024-03-06 HISTORY — PX: IR IMAGING GUIDED PORT INSERTION: IMG5740

## 2024-03-06 MED ORDER — MIDAZOLAM HCL 2 MG/2ML IJ SOLN
INTRAMUSCULAR | Status: AC | PRN
  Administered 2024-03-06 (×2): 1 mg via INTRAVENOUS

## 2024-03-06 MED ORDER — FENTANYL CITRATE (PF) 100 MCG/2ML IJ SOLN
INTRAMUSCULAR | Status: AC | PRN
  Administered 2024-03-06 (×2): 25 ug via INTRAVENOUS

## 2024-03-06 MED ORDER — LIDOCAINE HCL (PF) 1 % IJ SOLN
20.0000 mL | Freq: Once | INTRAMUSCULAR | Status: AC
  Administered 2024-03-06: 20 mL via INTRADERMAL

## 2024-03-06 MED ORDER — MIDAZOLAM HCL 2 MG/2ML IJ SOLN: 1.0000 mg | INTRAMUSCULAR | Status: DC | PRN

## 2024-03-06 MED ORDER — FENTANYL CITRATE PF 50 MCG/ML IJ SOSY: 25.0000 ug | PREFILLED_SYRINGE | INTRAMUSCULAR | Status: DC | PRN

## 2024-03-06 MED ORDER — SODIUM CHLORIDE 0.9 % IV SOLN: INTRAVENOUS | Status: DC

## 2024-03-06 NOTE — Progress Notes (Signed)
 Pharmacist Chemotherapy Monitoring - Initial Assessment    Anticipated start date: 03/09/24   The following has been reviewed per standard work regarding the patient's treatment regimen: The patient's diagnosis, treatment plan and drug doses, and organ/hematologic function Lab orders and baseline tests specific to treatment regimen  The treatment plan start date, drug sequencing, and pre-medications Prior authorization status  Patient's documented medication list, including drug-drug interaction screen and prescriptions for anti-emetics and supportive care specific to the treatment regimen The drug concentrations, fluid compatibility, administration routes, and timing of the medications to be used The patient's access for treatment and lifetime cumulative dose history, if applicable  The patient's medication allergies and previous infusion related reactions, if applicable   Changes made to treatment plan:  N/A  Follow up needed:  N/A   Merriam Brandner Kreul, RPH, 03/06/2024  4:22 PM

## 2024-03-06 NOTE — Telephone Encounter (Signed)
 Goodmorning Dr.Mansouraty,  Patient calling stating her PCP advised her to cancel procedure on 03/08/24 due to starting chemo soon. Patient stated if advised to reschedule she will call back  Please advise  Thank you

## 2024-03-06 NOTE — H&P (Signed)
 Chief Complaint: Patient was seen in consultation today for Chemotherapy- Port a cath placement at the request of Dr KATHEE Hof  Referring Physician(s): Debby Olam POUR  Supervising Physician: Jennefer Rover  Patient Status: DRI LB  History of Present Illness: Ann Lee is a 68 y.o. female   FULL Code status per pt  Recent diagnosis metastatic urothelial cancer Follows with Dr Hof Plans to start chemotherapy at [facility] facility soon  Scheduled here today for [facility] a cath placement   Past Medical History:  Diagnosis Date   Endometriosis    HERPES ZOSTER 10/18/2008   History of iron deficiency anemia    Hypertension    Bp'a have been running low, holding medication at this time   Migraines    NECK PAIN 02/15/2009   PARESTHESIA 10/30/2009   PERS HX TOBACCO USE PRESENTING HAZARDS HEALTH 03/10/2010   SINUSITIS, ACUTE 05/03/2009   SORE THROAT 02/12/2009   Squamous cell skin cancer    right forearm   TMJ SYNDROME 02/11/2009   Wears dentures     Past Surgical History:  Procedure Laterality Date   CYSTOSCOPY WITH BIOPSY Right 03/01/2024   Procedure: CYSTOSCOPY,;  Surgeon: Devere Lonni Righter, MD;  Location: [facility];  Service: Urology;  Laterality: Right;  CYSTOSCOPY WITH RIGHT URETEROSCOPY, POSSIBLE BIOPSY, RIGHT RETROGRADE PYELOGRAM, STENT PLACEMENT   CYSTOSCOPY WITH URETEROSCOPY AND STENT PLACEMENT Right 03/01/2024   Procedure: CYSTOURETEROSCOPY, WITH STENT INSERTION;  Surgeon: Devere Lonni Righter, MD;  Location: [facility];  Service: Urology;  Laterality: Right;   DILATION AND CURETTAGE OF UTERUS     HERNIA REPAIR     umbilical   WISDOM TOOTH EXTRACTION      Allergies: Iodine and Shellfish allergy  Medications: Prior to Admission medications   Medication Sig Start Date End Date Taking? Authorizing Provider  acetaminophen  (TYLENOL ) 500 MG tablet Take 500 mg by mouth in the morning, at noon, in the evening, and at bedtime.    [provider]  amLODipine  (NORVASC ) 5 MG tablet Take 1 tablet (5 mg total) by mouth daily. Patient not taking: Reported on 03/02/2024 10/15/23   Micheal Wolm ORN, MD  HYDROcodone  bit-homatropine (HYCODAN) 5-1.5 MG/5ML syrup Take 5 mLs by mouth every 6 (six) hours as needed. Patient not taking: Reported on 03/02/2024 02/23/24   Micheal Wolm ORN, MD  HYDROmorphone  (DILAUDID ) 2 MG tablet Take 0.5-1 tablets (1-2 mg total) by mouth every 6 (six) hours as needed for severe pain (pain score 7-10). 03/02/24   Debby Olam POUR, NP  ibuprofen  (ADVIL ) 200 MG tablet Take 200 mg by mouth 4 (four) times daily.    [provider]  LORazepam  (ATIVAN ) 0.5 MG tablet Take 1 tablet (0.5 mg total) by mouth every 6 (six) hours as needed for anxiety. 02/15/24   Burchette, Wolm ORN, MD  meloxicam  (MOBIC ) 15 MG tablet Take 1 tablet (15 mg total) by mouth daily. Patient not taking: Reported on 03/02/2024 12/15/22   Leonce Katz, DO  nystatin  cream (MYCOSTATIN ) Apply 1 Application topically 2 (two) times daily. Patient not taking: Reported on 03/02/2024 01/03/24   Micheal Wolm ORN, MD  oxybutynin  (DITROPAN ) 5 MG tablet Take 1 tablet (5 mg total) by mouth every 8 (eight) hours as needed for bladder spasms. 03/01/24   Devere Lonni Righter, MD  phenazopyridine  (PYRIDIUM ) 200 MG tablet Take 1 tablet (200 mg total) by mouth 3 (three) times daily as needed (for pain with urination). 03/01/24 03/01/25  Devere Lonni Righter, MD  prochlorperazine  (COMPAZINE ) 5 MG tablet  Take 1 tablet (5 mg total) by mouth every 6 (six) hours as needed for nausea or vomiting. 03/02/24   Debby Olam POUR, NP  SUMAtriptan  (IMITREX ) 25 MG tablet TAKE ONE AT ONSET OF MIGRAINE AND MAY REPEAT ONE IN TWO HOURS AS NEEDED (MAX OF 2 IN 24 HOURS) 11/25/22   Burchette, Wolm ORN, MD     Family History  Problem Relation Age of Onset   Arthritis Mother    Non-Hodgkin's lymphoma Mother    Esophageal cancer Mother    Stroke Mother    Hyperlipidemia Father     Arthritis Father    Colon cancer Father    Lung cancer Sister     Social History   Socioeconomic History   Marital status: Divorced    Spouse name: Not on file   Number of children: 4   Years of education: Not on file   Highest education level: Not on file  Occupational History   Not on file  Tobacco Use   Smoking status: Every Day    Current packs/day: 1.00    Average packs/day: 1 pack/day for 41.0 years (41.0 ttl pk-yrs)    Types: Cigarettes   Smokeless tobacco: Never   Tobacco comments:    She and sister are going to work on quitting together  Vaping Use   Vaping status: Never Used  Substance and Sexual Activity   Alcohol use: No    Alcohol/week: 0.0 standard drinks of alcohol   Drug use: No   Sexual activity: Not on file  Other Topics Concern   Not on file  Social History Narrative   Not on file   Social Drivers of Health   Financial Resource Strain: Low Risk  (02/17/2024)   Overall Financial Resource Strain (CARDIA)    Difficulty of Paying Living Expenses: Not very hard  Food Insecurity: No Food Insecurity (02/17/2024)   Hunger Vital Sign    Worried About Running Out of Food in the Last Year: Never true    Ran Out of Food in the Last Year: Never true  Transportation Needs: No Transportation Needs (02/17/2024)   PRAPARE - Administrator, Civil Service (Medical): No    Lack of Transportation (Non-Medical): No  Physical Activity: Sufficiently Active (02/17/2024)   Exercise Vital Sign    Days of Exercise per Week: 5 days    Minutes of Exercise per Session: 100 min  Stress: Stress Concern Present (02/17/2024)   [employer] of Occupational Health - Occupational Stress Questionnaire    Feeling of Stress: To some extent  Social Connections: Moderately Integrated (02/17/2024)   Social Connection and Isolation Panel    Frequency of Communication with Friends and Family: More than three times a week    Frequency of Social Gatherings with Friends and  Family: More than three times a week    Attends Religious Services: More than 4 times per year    Active Member of [employer] or Organizations: Yes    Attends Engineer, structural: More than 4 times per year    Marital Status: Divorced    Review of Systems: A 12 point ROS discussed and pertinent positives are indicated in the HPI above.  All other systems are negative.  Review of Systems  Constitutional:  Negative for activity change, fatigue and fever.  Respiratory:  Negative for cough and shortness of breath.   Gastrointestinal:  Negative for abdominal pain and nausea.  Neurological:  Positive for weakness.  Psychiatric/Behavioral:  Negative for behavioral  problems and confusion.     Vital Signs: BP 122/78 (BP Location: Left Arm, Patient Position: Sitting, Cuff Size: Normal)   Pulse (!) 108   Temp 98.5 F (36.9 C) (Oral)   Resp 20   SpO2 97%   Advance Care Plan: The advanced care plan/surrogate decision maker was discussed at the time of visit and documented in the medical record.    Physical Exam Vitals reviewed.  HENT:     Mouth/Throat:     Mouth: Mucous membranes are moist.  Cardiovascular:     Rate and Rhythm: Normal rate and regular rhythm.     Heart sounds: Normal heart sounds. No murmur heard. Pulmonary:     Effort: Pulmonary effort is normal.     Breath sounds: Normal breath sounds. No wheezing.  Abdominal:     Palpations: Abdomen is soft.  Musculoskeletal:        General: Normal range of motion.  Skin:    General: Skin is warm.  Neurological:     Mental Status: She is alert and oriented to person, place, and time.  Psychiatric:        Behavior: Behavior normal.     Imaging: DG C-Arm 1-60 Min-No Report Result Date: 03/01/2024 Fluoroscopy was utilized by the requesting physician.  No radiographic interpretation.   US  CORE BIOPSY (LYMPH NODES) Result Date: 02/24/2024 INDICATION: 68 year old with right renal lesion, retroperitoneal lymphadenopathy  and left supraclavicular lymphadenopathy. Tissue diagnosis is needed. EXAM: ULTRASOUND-GUIDED CORE BIOPSY OF LEFT SUPRACLAVICULAR LYMPH NODE MEDICATIONS: 1% lidocaine  for local anesthetic ANESTHESIA/SEDATION: None FLUOROSCOPY TIME:  None COMPLICATIONS: None immediate. PROCEDURE: Informed written consent was obtained from the patient after a thorough discussion of the procedural risks, benefits and alternatives. All questions were addressed. A timeout was performed prior to the initiation of the procedure. Ultrasound demonstrated enlarged left supraclavicular lymph nodes. A prominent lymph node was targeted for biopsy. Left side of the neck was prepped with chlorhexidine  and sterile field was created. Skin was anesthetized using 1% lidocaine . Small incision was made. Using ultrasound guidance, 18 gauge core biopsy needle was directed into the lymph node. Total of 5 core biopsies were obtained. Specimens placed on Telfa pad with saline. Bandage placed over the puncture site. FINDINGS: Enlarged left supraclavicular lymph nodes. Lymph node measuring 1.1 x 1.1 x 1.1 cm was biopsied. Biopsy needle confirmed within the lymph node. No immediate bleeding or hematoma formation. IMPRESSION: Ultrasound-guided core biopsy of a left supraclavicular lymph node. Electronically Signed   By: Juliene Balder M.D.   On: 02/24/2024 15:40   NM PET Image Initial (PI) Skull Base To Thigh (F-18 FDG) Result Date: 02/11/2024 CLINICAL DATA:  Initial treatment strategy for right renal mass. Unintentional weight loss. EXAM: NUCLEAR MEDICINE PET SKULL BASE TO THIGH TECHNIQUE: 6.3 mCi F-18 FDG was injected intravenously. Full-ring PET imaging was performed from the skull base to thigh after the radiotracer. CT data was obtained and used for attenuation correction and anatomic localization. Fasting blood glucose: 107 mg/dl COMPARISON:  CT scans from 02/08/2024 and 02/01/2024 FINDINGS: Mediastinal blood pool activity: SUV max 2.5 Liver activity: SUV max  NA NECK: No significant abnormal hypermetabolic activity in this region. Incidental CT findings: None. CHEST: Clustered left supraclavicular nodes measuring up to 0.9 cm in short axis with maximum SUV 13.2, compatible with malignancy. AP window lymph node 1.3 cm in short axis on image 61 series 4, maximum SUV 14.6 compatible with malignancy. Incidental CT findings: Aortic and branch vessel atheromatous vascular disease. Biapical pleuroparenchymal scarring.  Centrilobular emphysema. ABDOMEN/PELVIS: Hypermetabolic retrocrural, right gastric, retroperitoneal adenopathy compatible with malignancy. Retroperitoneal adenopathy anterior to the abdominal aorta measures 1.9 cm in short axis on image 131 series 4 with maximum SUV 16.5. The patient has a known right kidney lower pole mass along the medullary pyramid/calyx anteriorly based on recent CT examination, this mass is not readily differential from surrounding activity in the distended right collecting system. Right hydroureter extends to the iliac vessel cross over, tumor in the ureter not excluded. Incidental CT findings: Moderate right hydronephrosis and proximal hydroureter. Sigmoid colon diverticulosis. SKELETON: Solitary skeletal hypermetabolic lesion in the vicinity of the right anterior S1 sacral foramen, probably in the bone just anterior to the foramen, maximum SUV 13.5 compatible with malignancy. Incidental CT findings: None. IMPRESSION: 1. Hypermetabolic left supraclavicular, AP window, retrocrural, right gastric, and retroperitoneal adenopathy compatible with malignancy. 2. Solitary hypermetabolic skeletal lesion in the vicinity of the right anterior S1 sacral foramen, probably in the bone just anterior to the foramen, maximum SUV 13.5 compatible with malignancy. 3. The patient has a known right kidney lower pole mass along the medullary pyramid/calyx anteriorly based on recent CT examination, this mass is not readily differential from surrounding activity  in the distended right collecting system. Right hydroureter extends to the iliac vessel cross over, tumor in the ureter not excluded. 4. Moderate right hydronephrosis and proximal hydroureter. 5. Sigmoid colon diverticulosis. 6. Aortic and branch vessel atheromatous vascular disease. 7. Aortic Atherosclerosis (ICD10-I70.0) and Emphysema (ICD10-J43.9). Electronically Signed   By: Ryan Salvage M.D.   On: 02/11/2024 08:54   CT RENAL ABD W/WO Result Date: 02/08/2024 CLINICAL DATA:  Renal mass/cyst, indeterminate. * Tracking Code: BO * EXAM: CT ABDOMEN WITHOUT AND WITH CONTRAST TECHNIQUE: Multidetector CT imaging of the abdomen was performed following the standard protocol before and following the bolus administration of intravenous contrast. RADIATION DOSE REDUCTION: This exam was performed according to the departmental dose-optimization program which includes automated exposure control, adjustment of the mA and/or kV according to patient size and/or use of iterative reconstruction technique. CONTRAST:  OMNIPAQUE  IOHEXOL  300 MG/ML  SOLN COMPARISON:  CT scan abdomen and pelvis from 02/01/2024. FINDINGS: Lower chest: The lung bases are clear. No pleural effusion. The heart is normal in size. No pericardial effusion. Hepatobiliary: The liver is normal in size. Non-cirrhotic configuration. No suspicious mass. No intrahepatic or extrahepatic bile duct dilation. No calcified gallstones. Normal gallbladder wall thickness. No pericholecystic inflammatory changes. Pancreas: Unremarkable. No pancreatic ductal dilatation or surrounding inflammatory changes. Spleen: Within normal limits. No focal lesion. Adrenals/Urinary Tract: Adrenal glands are unremarkable. Non-contrast images: No radiopaque urinary tract calculi. There is a 1.6 x 1.7 x 2.0 cm hyperattenuating mass in the right kidney lower pole calyx which exhibits moderate enhancement on the post-contrast images and is compatible with urothelial malignancy.  There is subtle extension of this tumor into the anteroinferior wall of the right renal pelvis (series 6, image 74). No other suspicious renal lesion seen on either side. No nephroureterolithiasis on either side. Kidneys: Symmetric enhancement. Urinary Tract Opacification: Suboptimal. Collecting Systems and Ureters: There is moderate right hydronephrosis and hydroureter up to the level of right mid ureter (series 12, image 44), which exhibit abrupt narrowing of the diameter and irregular enhancing walls and mild to moderate periureteric fat stranding. There is also a linear bandlike enhancing area in the right upper ureter (series 10, image 102 and series 14, image 69). Please note right mid lower ureter were not evaluated on this CT scan abdomen  exam. No left hydroureteronephrosis. Stomach/Bowel: No disproportionate dilation of the small or large bowel loops. No evidence of abnormal bowel wall thickening or inflammatory changes. Partially seen appendix appears within normal limits. Vascular/Lymphatic: No ascites or pneumoperitoneum. There is extensive heterogeneous retroperitoneal lymphadenopathy, compatible with metastases. The largest such preaortic nodal mass measured up to 1.5 x 3.5 cm. There also multiple irregular heterogeneous retrocrural lymph nodes, also favored metastatic. No aneurysmal dilation of the major abdominal arteries. There are moderate peripheral atherosclerotic vascular calcifications of the aorta and its major branches. Other:  The soft tissues and abdominal wall are unremarkable. Musculoskeletal: No suspicious osseous lesions. There are mild multilevel degenerative changes in the visualized spine. IMPRESSION: 1. There is a 1.6 x 1.7 x 2.0 cm hyperattenuating, moderately enhancing mass in the right kidney lower pole calyx, which is compatible with urothelial malignancy. There is subtle extension of this tumor into the anteroinferior wall of the right renal pelvis. 2. There is moderate right  hydronephrosis and hydroureter up to the level of right mid ureter, which exhibit abrupt narrowing of the diameter and irregular enhancing walls and mild to moderate periureteric fat stranding. In view of suspected urothelial carcinoma, differential diagnosis includes focal ureteritis versus drop metastases. 3. There is an additional linear bandlike enhancing area in the right upper ureter which is also concerning for urothelial malignancy. 4. There is extensive heterogeneous retroperitoneal lymphadenopathy, compatible with metastases. 5. Multiple other nonacute observations, as described above. Aortic Atherosclerosis (ICD10-I70.0). Electronically Signed   By: Ree Molt M.D.   On: 02/08/2024 13:35    Labs:  CBC: Recent Labs    03/23/23 1043 01/03/24 1613 03/01/24 1159  WBC 6.5 8.2 10.4  HGB 14.5 13.9 13.5  HCT 44.5 41.7 41.7  PLT 218.0 246.0 291    COAGS: No results for input(s): INR, APTT in the last 8760 hours.  BMP: Recent Labs    01/03/24 1613 03/01/24 1159  NA 139 138  K 4.2 3.7  CL 104 100  CO2 29 23  GLUCOSE 85 99  BUN 13 13  CALCIUM 8.8 9.7  CREATININE 0.85 0.74  GFRNONAA  --  >60    LIVER FUNCTION TESTS: Recent Labs    01/03/24 1613  BILITOT 0.3  AST 16  ALT 9  ALKPHOS 77  PROT 6.4  ALBUMIN 3.9    TUMOR MARKERS: No results for input(s): AFPTM, CEA, CA199, CHROMGRNA in the last 8760 hours.  Assessment and Plan:  Scheduled for Port a cath placement today Risks and benefits of image guided port-a-catheter placement was discussed with the patient including, but not limited to bleeding, infection, pneumothorax, or fibrin sheath development and need for additional procedures.  All of the patient's questions were answered, patient is agreeable to proceed. Consent signed and in chart.  Thank you for this interesting consult.  I greatly enjoyed meeting Hatsuko Bizzarro and look forward to participating in their care.  A copy of this report  was sent to the requesting provider on this date.  Electronically Signed: Sharlet DELENA Candle, PA-C 03/06/2024, 12:35 PM   I spent a total of  30 Minutes   in face to face in clinical consultation, greater than 50% of which was counseling/coordinating care for [facility] placement

## 2024-03-06 NOTE — Telephone Encounter (Signed)
 Placed patient on 4 month recall if in the future starts having symptoms Thank you

## 2024-03-06 NOTE — Telephone Encounter (Signed)
 Please place 105-month colonoscopy recall, so if she is still having symptoms this can be readdressed. Thanks. GM

## 2024-03-06 NOTE — Procedures (Signed)
 Interventional Radiology Procedure Note  Procedure: Single Lumen Power Port Placement    Access:  Right internal jugular vein  Findings: Catheter tip positioned at cavoatrial junction. Port is ready for immediate use.   Complications: None  EBL: < 10 mL  Recommendations:  - Ok to shower in 24 hours - Do not submerge for 7 days - Routine line care    Ester Sides, MD

## 2024-03-07 ENCOUNTER — Inpatient Hospital Stay

## 2024-03-07 ENCOUNTER — Other Ambulatory Visit (HOSPITAL_BASED_OUTPATIENT_CLINIC_OR_DEPARTMENT_OTHER): Payer: Self-pay

## 2024-03-07 ENCOUNTER — Other Ambulatory Visit: Payer: Self-pay | Admitting: *Deleted

## 2024-03-07 ENCOUNTER — Other Ambulatory Visit: Payer: Self-pay | Admitting: Nurse Practitioner

## 2024-03-07 ENCOUNTER — Other Ambulatory Visit: Payer: Self-pay

## 2024-03-07 ENCOUNTER — Encounter: Payer: Self-pay | Admitting: Oncology

## 2024-03-07 DIAGNOSIS — C689 Malignant neoplasm of urinary organ, unspecified: Secondary | ICD-10-CM

## 2024-03-07 DIAGNOSIS — Z5112 Encounter for antineoplastic immunotherapy: Secondary | ICD-10-CM | POA: Diagnosis not present

## 2024-03-07 LAB — CMP (CANCER CENTER ONLY)
ALT: 10 U/L (ref 0–44)
AST: 12 U/L — ABNORMAL LOW (ref 15–41)
Albumin: 3.7 g/dL (ref 3.5–5.0)
Alkaline Phosphatase: 78 U/L (ref 38–126)
Anion gap: 16 — ABNORMAL HIGH (ref 5–15)
BUN: 17 mg/dL (ref 8–23)
CO2: 20 mmol/L — ABNORMAL LOW (ref 22–32)
Calcium: 9.6 mg/dL (ref 8.9–10.3)
Chloride: 100 mmol/L (ref 98–111)
Creatinine: 0.67 mg/dL (ref 0.44–1.00)
GFR, Estimated: >60 mL/min (ref >60)
Glucose, Bld: 81 mg/dL (ref 70–99)
Potassium: 3.8 mmol/L (ref 3.5–5.1)
Sodium: 135 mmol/L (ref 135–145)
Total Bilirubin: 0.5 mg/dL (ref 0.0–1.2)
Total Protein: 6.4 g/dL — ABNORMAL LOW (ref 6.5–8.1)

## 2024-03-07 LAB — CBC WITH DIFFERENTIAL (CANCER CENTER ONLY)
Abs Immature Granulocytes: 0.06 K/uL (ref 0.00–0.07)
Basophils Absolute: 0 K/uL (ref 0.0–0.1)
Basophils Relative: 0 %
Eosinophils Absolute: 0.3 K/uL (ref 0.0–0.5)
Eosinophils Relative: 3 %
HCT: 37.1 % (ref 36.0–46.0)
Hemoglobin: 12 g/dL (ref 12.0–15.0)
Immature Granulocytes: 1 %
Lymphocytes Relative: 10 %
Lymphs Abs: 1.1 K/uL (ref 0.7–4.0)
MCH: 29.8 pg (ref 26.0–34.0)
MCHC: 32.3 g/dL (ref 30.0–36.0)
MCV: 92.1 fL (ref 80.0–100.0)
Monocytes Absolute: 0.9 K/uL (ref 0.1–1.0)
Monocytes Relative: 8 %
Neutro Abs: 8.3 K/uL — ABNORMAL HIGH (ref 1.7–7.7)
Neutrophils Relative %: 78 %
Platelet Count: 278 K/uL (ref 150–400)
RBC: 4.03 MIL/uL (ref 3.87–5.11)
RDW: 13.9 % (ref 11.5–15.5)
WBC Count: 10.7 K/uL — ABNORMAL HIGH (ref 4.0–10.5)
nRBC: 0 % (ref 0.0–0.2)

## 2024-03-07 LAB — TSH: TSH: 0.738 u[IU]/mL (ref 0.350–4.500)

## 2024-03-07 MED ORDER — SODIUM CHLORIDE 0.9 % IV SOLN: INTRAVENOUS | Status: DC

## 2024-03-07 MED ORDER — ONDANSETRON HCL 8 MG PO TABS
8.0000 mg | ORAL_TABLET | Freq: Three times a day (TID) | ORAL | 0 refills | Status: DC | PRN
  Filled 2024-03-07: qty 20, 7d supply, fill #0

## 2024-03-07 MED ORDER — LIDOCAINE-PRILOCAINE 2.5-2.5 % EX CREA
1.0000 | TOPICAL_CREAM | CUTANEOUS | 0 refills | Status: DC | PRN
  Filled 2024-03-07: qty 30, 14d supply, fill #0

## 2024-03-07 NOTE — Progress Notes (Signed)
 PATIENT NAVIGATOR PROGRESS NOTE  Name: Ann Lee Date: 03/07/2024 MRN: [id number]  DOB: [birth year]   Reason for visit:  Education session  Comments:  Please education note and distress screening    Time spent counseling/coordinating care: > 60 minutes

## 2024-03-07 NOTE — Patient Instructions (Signed)

## 2024-03-07 NOTE — Progress Notes (Signed)
 Ann Lee presented to the office today for chemotherapy education class.  She reports poor oral intake due to a combination of nausea and early satiety.  Fluid intake is significantly diminished.  She felt better after receiving IV fluids recently.  Vital signs stable.  Tongue appears dry.  Skin turgor is decreased.  She will receive a liter of normal saline today and again tomorrow.  She will work to increase fluid intake at home.

## 2024-03-07 NOTE — Progress Notes (Signed)
 [facility] Clinical Social Work  Initial Assessment   Ann Lee is a 68 y.o. year old female seen after chemo education with her sister. Clinical Social Work was referred by Statistician for New Patient Assessment. Ann Lee just finished up chemotherapy education, Clinical Social Worker kept assessment brief due to this.   SDOH (Social Determinants of Health) assessments performed: Yes   SDOH Screenings   Food Insecurity: No Food Insecurity (02/17/2024)  Housing: Unknown (02/17/2024)  Transportation Needs: No Transportation Needs (02/17/2024)  Utilities: Not At Risk (02/17/2024)  Alcohol Screen: Low Risk  (02/17/2024)  Depression (PHQ2-9): Low Risk  (03/02/2024)  Financial Resource Strain: Low Risk  (02/17/2024)  Physical Activity: Sufficiently Active (02/17/2024)  Social Connections: Moderately Integrated (02/17/2024)  Stress: Stress Concern Present (02/17/2024)  Tobacco Use: High Risk (03/06/2024)    PHQ 2/9:    03/02/2024    2:00 PM 02/17/2024   10:00 AM 03/23/2023   10:09 AM  Depression screen PHQ 2/9  Decreased Interest 0 0 0  Down, Depressed, Hopeless 0 0 0  PHQ - 2 Score 0 0 0  Altered sleeping   0  Tired, decreased energy   0  Change in appetite   0  Feeling bad or failure about yourself    0  Trouble concentrating   0  Moving slowly or fidgety/restless   0  Suicidal thoughts   0  PHQ-9 Score   0     Distress Screen completed: Yes     No data to display            Family/Social Information:  Housing Arrangement: patient lives alone Family members/support persons in your life? Ann Lee named her sister and her three sons as her support system.  Transportation concerns: No transportation barriers noted at time of assessment. Ann Lee's support system will transport her if needed.   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 No concerns reported about income.   Income source: Employment Financial concerns: No Type of concern:  None Food access concerns: no Religious or spiritual practice: Ann Lee is of the [employer].  Advanced directives: No Advance Directives completed. Ann Lee is aware of how to complete them with Ann Lee. Services Currently in place:  insurance (medicare), employment, transportation, income  Coping/ Adjustment to diagnosis: Patient understands treatment plan and what happens next? yes, Ann Lee understands her diagnosis and the purpose of treatment.  Concerns about diagnosis and/or treatment: Ann Lee denied any immediate worries or concerns about the diagnosis. Ann Lee discussed expected emotions of fear and nervousness, and the desire to get going with treatment.   Patient reported stressors: No reported stressors at this time. However, Ann Lee and her family supported her sister who had a cancer diagnosis. That named experience adds to the presenting emotions.  Hopes and/or priorities: For treatment to get started.  Patient enjoys No named hobbies  Current coping skills/ strengths: Ability for insight , Active sense of humor , Average or above average intelligence , Capable of independent living , Communication skills , Contractor , General fund of knowledge , Motivation for treatment/growth , Religious Affiliation , and Supportive family/friends     SUMMARY: Current SDOH Barriers:  No SDOH barriers noted at this time.   Clinical Social Work Clinical Goal(s):  Patient will work with SW to address concerns related to adjustment to diagnosis. Ann Lee was not feeling well at time of assessment. CSW will connect with patient at a later date to ensure no additional needs.  Interventions: Discussed common feeling and emotions when being diagnosed with cancer, and the importance of support during treatment Informed patient of the support team roles and support services at [facility] Provided CSW contact information and encouraged patient to call with any questions or concerns Provided  education and assistance to client regarding Advanced Directives.    Follow Up Plan: Patient will contact CSW with any support or resource needs Patient verbalizes understanding of plan: Yes  Ann Sprague, LCSW Clinical Social Worker [facility]

## 2024-03-07 NOTE — Patient Instructions (Signed)
 Dehydration, Adult Dehydration is a condition in which there is not enough water or other fluids in the body. This happens when a person loses more fluids than they take in. Important organs cannot work right without the right amount of fluids. Any loss of fluids from the body can cause dehydration. Dehydration can be mild, worse, or very bad. It should be treated right away to keep it from getting very bad. What are the causes? Conditions that cause loss of water in the body. They include: Watery poop (diarrhea). Vomiting. Sweating a lot. Fever. Infection. Peeing (urinating) a lot. Not drinking enough fluids. Certain medicines, such as medicines that take extra fluid out of the body (diuretics). Lack of safe drinking water. Not being able to get enough water and food. What increases the risk? Having a long-term (chronic) illness that has not been treated the right way, such as: Diabetes. Heart disease. Kidney disease. Being 25 years of age or older. Having a disability. Living in a place that is high above the ground or sea (high in altitude). The thinner, drier air causes more fluid loss. Doing exercises that put stress on your body for a long time. Being active when in hot places. What are the signs or symptoms? Symptoms of dehydration depend on how bad it is. Mild or worse dehydration Thirst. Dry lips or dry mouth. Feeling dizzy or light-headed. Muscle cramps. Passing little pee or dark pee. Pee may be the color of tea. Headache. Very bad dehydration Changes in skin. Skin may: Be cold to the touch (clammy). Be blotchy or pale. Not go back to normal right after you pinch it and let it go. Little or no tears, pee, or sweat. Fast breathing. Low blood pressure. Weak pulse. Pulse that is more than 100 beats a minute when you are sitting still. Other changes, such as: Feeling very thirsty. Eyes that look hollow (sunken). Cold hands and feet. Being confused. Being very  tired (lethargic) or having trouble waking from sleep. Losing weight. Loss of consciousness. How is this treated? Treatment for this condition depends on how bad your dehydration is. Treatment should start right away. Do not wait until your condition gets very bad. Very bad dehydration is an emergency. You will need to go to a hospital. Mild or worse dehydration can be treated at home. You may be asked to: Drink more fluids. Drink an oral rehydration solution (ORS). This drink gives you the right amount of fluids, salts, and minerals (electrolytes). Very bad dehydration can be treated: With fluids through an IV tube. By correcting low levels of electrolytes in the body. By treating the problem that caused your dehydration. Follow these instructions at home: Oral rehydration solution If told by your doctor, drink an ORS: Make an ORS. Use instructions on the package. Start by drinking small amounts, about  cup (120 mL) every 5-10 minutes. Slowly drink more until you have had the amount that your doctor said to have.  Eating and drinking  Drink enough clear fluid to keep your pee pale yellow. If you were told to drink an ORS, finish the ORS first. Then, start slowly drinking other clear fluids. Drink fluids such as: Water. Do not drink only water. Doing that can make the salt (sodium) level in your body get too low. Water from ice chips you suck on. Fruit juice that you have added water to (diluted). Low-calorie sports drinks. Eat foods that have the right amounts of salts and minerals, such as bananas, oranges, potatoes,  tomatoes, or spinach. Do not drink alcohol. Avoid drinks that have caffeine or sugar. These include:: High-calorie sports drinks. Fruit juice that you did not add water to. Soda. Coffee or energy drinks. Avoid foods that are greasy or have a lot of fat or sugar. General instructions Take over-the-counter and prescription medicines only as told by your doctor. Do  not take sodium tablets. Doing that can make the salt level in your body get too high. Return to your normal activities as told by your doctor. Ask your doctor what activities are safe for you. Keep all follow-up visits. Your doctor may check and change your treatment. Contact a doctor if: You have pain in your belly (abdomen) and the pain: Gets worse. Stays in one place. You have a rash. You have a stiff neck. You get angry or annoyed more easily than normal. You are more tired or have a harder time waking than normal. You feel weak or dizzy. You feel very thirsty. Get help right away if: You have any symptoms of very bad dehydration. You vomit every time you eat or drink. Your vomiting gets worse, does not go away, or you vomit blood or green stuff. You are getting treatment, but symptoms are getting worse. You have a fever. You have a very bad headache. You have: Diarrhea that gets worse or does not go away. Blood in your poop (stool). This may cause poop to look black and tarry. No pee in 6-8 hours. Only a small amount of pee in 6-8 hours, and the pee is very dark. You have trouble breathing. These symptoms may be an emergency. Get help right away. Call 911. Do not wait to see if the symptoms will go away. Do not drive yourself to the hospital. This information is not intended to replace advice given to you by your health care provider. Make sure you discuss any questions you have with your health care provider. Document Revised: 12/29/2021 Document Reviewed: 12/29/2021 Elsevier Patient Education  2024 [employer].

## 2024-03-08 ENCOUNTER — Inpatient Hospital Stay

## 2024-03-08 ENCOUNTER — Encounter: Admitting: Gastroenterology

## 2024-03-08 ENCOUNTER — Other Ambulatory Visit: Payer: Self-pay | Admitting: Nurse Practitioner

## 2024-03-08 ENCOUNTER — Other Ambulatory Visit: Payer: Self-pay

## 2024-03-08 DIAGNOSIS — Z5112 Encounter for antineoplastic immunotherapy: Secondary | ICD-10-CM | POA: Diagnosis not present

## 2024-03-08 DIAGNOSIS — R319 Hematuria, unspecified: Secondary | ICD-10-CM | POA: Diagnosis not present

## 2024-03-08 DIAGNOSIS — Z720 Tobacco use: Secondary | ICD-10-CM | POA: Diagnosis not present

## 2024-03-08 DIAGNOSIS — R634 Abnormal weight loss: Secondary | ICD-10-CM | POA: Diagnosis not present

## 2024-03-08 DIAGNOSIS — C651 Malignant neoplasm of right renal pelvis: Secondary | ICD-10-CM | POA: Diagnosis not present

## 2024-03-08 DIAGNOSIS — R112 Nausea with vomiting, unspecified: Secondary | ICD-10-CM | POA: Diagnosis not present

## 2024-03-08 DIAGNOSIS — C689 Malignant neoplasm of urinary organ, unspecified: Secondary | ICD-10-CM

## 2024-03-08 DIAGNOSIS — C770 Secondary and unspecified malignant neoplasm of lymph nodes of head, face and neck: Secondary | ICD-10-CM | POA: Diagnosis not present

## 2024-03-08 DIAGNOSIS — M899 Disorder of bone, unspecified: Secondary | ICD-10-CM | POA: Diagnosis not present

## 2024-03-08 DIAGNOSIS — Z79620 Long term (current) use of immunosuppressive biologic: Secondary | ICD-10-CM | POA: Diagnosis not present

## 2024-03-08 LAB — T4: T4, Total: 7.1 ug/dL (ref 4.5–12.0)

## 2024-03-08 MED ORDER — SODIUM CHLORIDE 0.9 % IV SOLN: INTRAVENOUS | Status: AC

## 2024-03-08 NOTE — Patient Instructions (Signed)

## 2024-03-09 ENCOUNTER — Inpatient Hospital Stay

## 2024-03-09 ENCOUNTER — Other Ambulatory Visit

## 2024-03-09 ENCOUNTER — Inpatient Hospital Stay (HOSPITAL_BASED_OUTPATIENT_CLINIC_OR_DEPARTMENT_OTHER): Admitting: Nurse Practitioner

## 2024-03-09 ENCOUNTER — Encounter: Payer: Self-pay | Admitting: Nurse Practitioner

## 2024-03-09 ENCOUNTER — Ambulatory Visit: Admitting: Gastroenterology

## 2024-03-09 VITALS — BP 117/81 | HR 96 | Temp 98.2°F | Resp 18 | Ht 63.0 in | Wt 122.1 lb

## 2024-03-09 VITALS — BP 148/73 | HR 81 | Temp 98.2°F | Resp 18

## 2024-03-09 DIAGNOSIS — C641 Malignant neoplasm of right kidney, except renal pelvis: Secondary | ICD-10-CM

## 2024-03-09 DIAGNOSIS — Z5112 Encounter for antineoplastic immunotherapy: Secondary | ICD-10-CM | POA: Diagnosis not present

## 2024-03-09 MED ORDER — SODIUM CHLORIDE 0.9 % IV SOLN
1.2500 mg/kg | Freq: Once | INTRAVENOUS | Status: AC
  Administered 2024-03-09: 70 mg via INTRAVENOUS
  Filled 2024-03-09: qty 3

## 2024-03-09 MED ORDER — SODIUM CHLORIDE 0.9 % IV SOLN: INTRAVENOUS | Status: DC

## 2024-03-09 MED ORDER — SODIUM CHLORIDE 0.9 % IV SOLN
200.0000 mg | Freq: Once | INTRAVENOUS | Status: AC
  Administered 2024-03-09: 200 mg via INTRAVENOUS
  Filled 2024-03-09: qty 8

## 2024-03-09 MED ORDER — PROCHLORPERAZINE MALEATE 10 MG PO TABS
10.0000 mg | ORAL_TABLET | Freq: Once | ORAL | Status: AC
  Administered 2024-03-09: 10 mg via ORAL
  Filled 2024-03-09: qty 1

## 2024-03-09 NOTE — Patient Instructions (Signed)
 [facility] - A DEPT OF [facility]  Discharge Instructions: Thank you for choosing [facility] to provide your oncology and hematology care.   If you have a lab appointment with the Cancer Center, please go directly to the Cancer Center and check in at the registration area.   Wear comfortable clothing and clothing appropriate for easy access to any Portacath or PICC line.   We strive to give you quality time with your provider. You may need to reschedule your appointment if you arrive late (15 or more minutes).  Arriving late affects you and other patients whose appointments are after yours.  Also, if you miss three or more appointments without notifying the office, you may be dismissed from the clinic at the provider's discretion.      For prescription refill requests, have your pharmacy contact our office and allow 72 hours for refills to be completed.    Today you received the following chemotherapy and/or immunotherapy agents: padcev  and keytruda    Enfortumab Vedotin  Injection What is this medication? ENFORTUMAB VEDOTIN  (en FORT ue mab ve DOE tin) treats bladder cancer and kidney cancer. It works by blocking a protein that causes cancer cells to grow and multiply. This helps to slow or stop the spread of cancer cells. This medicine may be used for other purposes; ask your health care provider or pharmacist if you have questions. COMMON BRAND NAME(S): PADCEV  What should I tell my care team before I take this medication? They need to know if you have any of these conditions: Diabetes Eye disease Liver disease Lung disease Tingling of the fingers or toes or other nerve disorder Vision problems An unusual or allergic reaction to enfortumab vedotin , other medications, foods, dyes, or preservatives Pregnant or trying to get pregnant Breast-feeding How should I use this medication? This medication is injected into a vein. It is given by your  care team in a hospital or clinic setting. Talk to your care team about the use of this medication in children. Special care may be needed. Overdosage: If you think you have taken too much of this medicine contact a poison control center or emergency room at once. NOTE: This medicine is only for you. Do not share this medicine with others. What if I miss a dose? Keep appointments for follow-up doses. It is important not to miss your dose. Call your care team if you are unable to keep an appointment. What may interact with this medication? This medication may affect how other medications work, and other medications may affect how this medication works. Talk with your care team about all of the medications you take. They may suggest changes to your treatment plan to lower the risk of side effects and to make sure your medications work as intended. This list may not describe all possible interactions. Give your health care provider a list of all the medicines, herbs, non-prescription drugs, or dietary supplements you use. Also tell them if you smoke, drink alcohol, or use illegal drugs. Some items may interact with your medicine. What should I watch for while using this medication? Your condition will be monitored carefully while you are receiving this medication. This medication may make you feel generally unwell. This is not uncommon as chemotherapy can affect healthy cells as well as cancer cells. Report any side effects. Continue your course of treatment even though you feel ill unless your care team tells you to stop. This medication may increase blood  sugar. The risk may be higher in patients who already have diabetes. Ask your care team what you can do to lower your risk of diabetes while taking this medication. This medication can cause a serious condition in which there is too much acid in your blood. If you develop nausea, vomiting, stomach pain, unusual tiredness, or trouble breathing, stop  taking this medication and call your care team right away. If possible, use a ketone dipstick to check for ketones in your urine. This medication may cause dry eyes and blurred vision. If you wear contact lenses, you may feel some discomfort. Lubricating eye drops may help. See your care team if the problem does not go away or is severe. Tell your care team right away if you have any change in your eyesight. This medication may increase your risk of getting an infection. Call your care team for advice if you get a fever, chills, sore throat, or other symptoms of a cold or flu. Do not treat yourself. Try to avoid being around people who are sick. Avoid taking medications that contain aspirin, acetaminophen , ibuprofen , naproxen, or ketoprofen unless instructed by your care team. These medications may hide a fever. This medication may cause serious skin reactions. They can happen weeks to months after starting the medication. Contact your care team right away if you notice fevers or flu-like symptoms with a rash. The rash may be red or purple and then turn into blisters or peeling of the skin. You may also notice a red rash with swelling of the face, lips or lymph nodes in your neck or under your arms. Talk to your care team if you or your partner may be pregnant. Serious birth defects can occur if you take this medication during pregnancy and for 2 months after the last dose. You will need a negative pregnancy test before starting this medication. Contraception is recommended while taking this medication and for 2 months after the last dose. Your care team can help you find the option that works for you. If your partner can get pregnant, use a condom during sex while taking this medication and for 4 months after the last dose. Do not breastfeed while taking this medicine or for at least 3 weeks after the last dose. This medication may cause infertility. Talk to your care team if you are concerned about your  fertility. What side effects may I notice from receiving this medication? Side effects that you should report to your care team as soon as possible: Allergic reactions--skin rash, itching, hives, swelling of the face, lips, tongue, or throat Dry cough, shortness of breath or trouble breathing Eye pain, redness, irritation, or discharge with blurry or decreased vision High blood sugar (hyperglycemia)--increased thirst or amount of urine, unusual weakness or fatigue, blurry vision Painful swelling, warmth, or redness of the skin, blisters or sores at the infusion site Pain, tingling, or numbness in the hands or feet Redness, blistering, peeling, or loosening of the skin, including inside the mouth Unusual bruising or bleeding Side effects that usually do not require medical attention (report these to your care team if they continue or are bothersome): Change in taste Diarrhea Dry eyes Fatigue Hair loss Loss of appetite This list may not describe all possible side effects. Call your doctor for medical advice about side effects. You may report side effects to FDA at 1-800-FDA-1088. Where should I keep my medication? This medication is given in a hospital or clinic. It will not be stored at home.  NOTE: This sheet is a summary. It may not cover all possible information. If you have questions about this medicine, talk to your doctor, pharmacist, or health care provider.  2024 Elsevier/Gold Standard (2021-10-14 00:00:00)  Pembrolizumab  Injection What is this medication? PEMBROLIZUMAB  (PEM broe LIZ ue mab) treats some types of cancer. It works by helping your immune system slow or stop the spread of cancer cells. It is a monoclonal antibody. This medicine may be used for other purposes; ask your health care provider or pharmacist if you have questions. COMMON BRAND NAME(S): Keytruda  What should I tell my care team before I take this medication? They need to know if you have any of these  conditions: Allogeneic stem cell transplant (uses someone else's stem cells) Autoimmune diseases, such as Crohn disease, ulcerative colitis, lupus History of chest radiation Nervous system problems, such as Guillain-Barre syndrome, myasthenia gravis Organ transplant An unusual or allergic reaction to pembrolizumab , other medications, foods, dyes, or preservatives Pregnant or trying to get pregnant Breast-feeding How should I use this medication? This medication is injected into a vein. It is given by your care team in a hospital or clinic setting. A special MedGuide will be given to you before each treatment. Be sure to read this information carefully each time. Talk to your care team about the use of this medication in children. While it may be prescribed for children as young as 6 months for selected conditions, precautions do apply. Overdosage: If you think you have taken too much of this medicine contact a poison control center or emergency room at once. NOTE: This medicine is only for you. Do not share this medicine with others. What if I miss a dose? Keep appointments for follow-up doses. It is important not to miss your dose. Call your care team if you are unable to keep an appointment. What may interact with this medication? Interactions have not been studied. This list may not describe all possible interactions. Give your health care provider a list of all the medicines, herbs, non-prescription drugs, or dietary supplements you use. Also tell them if you smoke, drink alcohol, or use illegal drugs. Some items may interact with your medicine. What should I watch for while using this medication? Your condition will be monitored carefully while you are receiving this medication. You may need blood work while taking this medication. This medication may cause serious skin reactions. They can happen weeks to months after starting the medication. Contact your care team right away if you notice  fevers or flu-like symptoms with a rash. The rash may be red or purple and then turn into blisters or peeling of the skin. You may also notice a red rash with swelling of the face, lips, or lymph nodes in your neck or under your arms. Tell your care team right away if you have any change in your eyesight. Talk to your care team if you may be pregnant. Serious birth defects can occur if you take this medication during pregnancy and for 4 months after the last dose. You will need a negative pregnancy test before starting this medication. Contraception is recommended while taking this medication and for 4 months after the last dose. Your care team can help you find the option that works for you. Do not breastfeed while taking this medication and for 4 months after the last dose. What side effects may I notice from receiving this medication? Side effects that you should report to your care team as soon as possible:  Allergic reactions--skin rash, itching, hives, swelling of the face, lips, tongue, or throat Dry cough, shortness of breath or trouble breathing Eye pain, redness, irritation, or discharge with blurry or decreased vision Heart muscle inflammation--unusual weakness or fatigue, shortness of breath, chest pain, fast or irregular heartbeat, dizziness, swelling of the ankles, feet, or hands Hormone gland problems--headache, sensitivity to light, unusual weakness or fatigue, dizziness, fast or irregular heartbeat, increased sensitivity to cold or heat, excessive sweating, constipation, hair loss, increased thirst or amount of urine, tremors or shaking, irritability Infusion reactions--chest pain, shortness of breath or trouble breathing, feeling faint or lightheaded Kidney injury (glomerulonephritis)--decrease in the amount of urine, red or dark brown urine, foamy or bubbly urine, swelling of the ankles, hands, or feet Liver injury--right upper belly pain, loss of appetite, nausea, light-colored stool,  dark yellow or brown urine, yellowing skin or eyes, unusual weakness or fatigue Pain, tingling, or numbness in the hands or feet, muscle weakness, change in vision, confusion or trouble speaking, loss of balance or coordination, trouble walking, seizures Rash, fever, and swollen lymph nodes Redness, blistering, peeling, or loosening of the skin, including inside the mouth Sudden or severe stomach pain, bloody diarrhea, fever, nausea, vomiting Side effects that usually do not require medical attention (report to your care team if they continue or are bothersome): Bone, joint, or muscle pain Diarrhea Fatigue Loss of appetite Nausea Skin rash This list may not describe all possible side effects. Call your doctor for medical advice about side effects. You may report side effects to FDA at 1-800-FDA-1088. Where should I keep my medication? This medication is given in a hospital or clinic. It will not be stored at home. NOTE: This sheet is a summary. It may not cover all possible information. If you have questions about this medicine, talk to your doctor, pharmacist, or health care provider.  2024 Elsevier/Gold Standard (2021-10-14 00:00:00)      To help prevent nausea and vomiting after your treatment, we encourage you to take your nausea medication as directed.  BELOW ARE SYMPTOMS THAT SHOULD BE REPORTED IMMEDIATELY: *FEVER GREATER THAN 100.4 F (38 C) OR HIGHER *CHILLS OR SWEATING *NAUSEA AND VOMITING THAT IS NOT CONTROLLED WITH YOUR NAUSEA MEDICATION *UNUSUAL SHORTNESS OF BREATH *UNUSUAL BRUISING OR BLEEDING *URINARY PROBLEMS (pain or burning when urinating, or frequent urination) *BOWEL PROBLEMS (unusual diarrhea, constipation, pain near the anus) TENDERNESS IN MOUTH AND THROAT WITH OR WITHOUT PRESENCE OF ULCERS (sore throat, sores in mouth, or a toothache) UNUSUAL RASH, SWELLING OR PAIN  UNUSUAL VAGINAL DISCHARGE OR ITCHING   Items with * indicate a potential emergency and should be  followed up as soon as possible or go to the Emergency Department if any problems should occur.  Please show the CHEMOTHERAPY ALERT CARD or IMMUNOTHERAPY ALERT CARD at check-in to the Emergency Department and triage nurse.  Should you have questions after your visit or need to cancel or reschedule your appointment, please contact [facility] - A DEPT OF [facility]  Dept: [phone]  and follow the prompts.  Office hours are 8:00 a.m. to 4:30 p.m. Monday - Friday. Please note that voicemails left after 4:00 p.m. may not be returned until the following business day.  We are closed weekends and major holidays. You have access to a nurse at all times for urgent questions. Please call the main number to the clinic Dept: [phone] and follow the prompts.   For any non-urgent questions, you may also contact your  provider using MyChart. We now offer e-Visits for anyone 26 and older to request care online for non-urgent symptoms. For details visit [email or web address].   Also download the MyChart app! Go to the app store, search MyChart, open the app, select , and log in with your MyChart username and password.

## 2024-03-09 NOTE — Progress Notes (Signed)
  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  She is seen prior to beginning treatment with Pembrolizumab  and enfortumab.  She continues to have back pain.  The pain worsens after eating.  She is alternating Tylenol  and ibuprofen  which helps.  She has occasional nausea.  Single episode of vomiting.  Bowels are moving.  She noted blood in her urine earlier today.  Objective:  Vital signs in last 24 hours:  Blood pressure 117/81, pulse 96, temperature 98.2 F (36.8 C), temperature source Temporal, resp. rate 18, height 5' 3 (1.6 m), weight 122 lb 1.6 oz (55.4 kg), SpO2 98%.    HEENT: No thrush or ulcers. Resp: Lungs clear bilaterally. Cardio: Regular rate and rhythm. GI: No hepatosplenomegaly.  Nontender. Vascular: No leg edema. Port-A-Cath without erythema.  There is a resolving ecchymosis extending to the right breast.  Lab Results:  Lab Results  Component Value Date   WBC 10.7 (H) 03/07/2024   HGB 12.0 03/07/2024   HCT 37.1 03/07/2024   MCV 92.1 03/07/2024   PLT 278 03/07/2024   NEUTROABS 8.3 (H) 03/07/2024    Imaging:  No results found.  Medications: I have reviewed the patient's current medications.  Assessment/Plan: Right renal mass CTs abdomen/pelvis 02/01/2024-mild right-sided hydroureteronephrosis extending to the mid to distal ureter; multiple enlarged retroperitoneal lymph nodes involving the periaortic and aortocaval spaces; ovoid hyperdensity along the lower pole of the right kidney measuring 1.8 cm.   CT renal 02/08/2024-1.6 x 1.7 x 2.0 cm hyperattenuating moderately enhancing mass in the right kidney lower pole calyx with subtle extension of tumor into the anteroinferior wall of the right renal pelvis; moderate right hydronephrosis and hydroureter up to the level of the right mid ureter; additional linear bandlike enhancing area in the right  upper ureter; extensive heterogeneous retroperitoneal lymphadenopathy.   PET scan 02/11/2024-hypermetabolic left supraclavicular, AP window, retrocrural, right gastric and retroperitoneal adenopathy; solitary hypermetabolic skeletal lesion in the vicinity of the right anterior S1 sacral foramen. Biopsy left supraclavicular lymph node 02/24/2024-metastatic carcinoma compatible with clinical suspicion of metastatic urothelial carcinoma Cystoscopy/right ureter stent placement 03/01/2024-No intravesical or urethral abnormality seen; solitary right collecting system with no filling defects or dilation involving the right ureter or right renal pelvis; papillary tumor involving the right mid pole calyx with features concerning for urothelial carcinoma. Cycle 1 day 1 pembrolizumab  and enfortumab Back pain secondary to #1 Change in bowel habits, postprandial abdominal pain Emphysema Tobacco use  Disposition: Ann Lee appears stable.  Plan to proceed with cycle 1 day 1 Pembrolizumab /enfortumab today as scheduled.  CBC and chemistry panel completed 03/07/2024, adequate for treatment.  For pain she will continue Tylenol  and ibuprofen .  She has hydromorphone  to take as needed.  She will return for follow-up and day 8 enfortumab in 1 week.  We are available to see her sooner if needed.    Olam Ned ANP/GNP-BC   03/09/2024  12:01 PM

## 2024-03-09 NOTE — Progress Notes (Signed)
 Patient seen by Olam Ned NP today  Vitals are within treatment parameters:Yes   Labs are within treatment parameters: Yes   Treatment plan has been signed: Yes   Per physician team, Patient is ready for treatment and there are NO modifications to the treatment plan.

## 2024-03-10 ENCOUNTER — Encounter: Payer: Self-pay | Admitting: *Deleted

## 2024-03-10 ENCOUNTER — Telehealth: Payer: Self-pay

## 2024-03-10 ENCOUNTER — Other Ambulatory Visit: Payer: Self-pay

## 2024-03-10 NOTE — Progress Notes (Signed)
 Received fax from [facility] that testing order has been received and they are working to obtain tissue.

## 2024-03-10 NOTE — Telephone Encounter (Signed)
 1st CHEMO CALLBACK  Pt was called by Camelia NOVAK, RN to inquire about how patient is doing after her first chemo treatment yesterday. She reports slight nausea that was relieved by nausea meds. Decreased appetite but does have intake. She had no questions but was advised to call our office should any arise.

## 2024-03-12 ENCOUNTER — Telehealth: Payer: Self-pay | Admitting: Hematology

## 2024-03-12 NOTE — Telephone Encounter (Signed)
 Pt called reported severe back pain on the side of her ureter stent placement, no fever , chills or hematuria. She tried tylenol  but did not help. I spoke with the answering service RN, and recommend ED visit to make sure it's not stent disposition or malfunction.  If she does not want go, okay to take hydromorphone  which she has, call her urologist and Dr. Christell office tomorrow morning.  Onita Mattock MD 03/12/2024

## 2024-03-13 ENCOUNTER — Other Ambulatory Visit: Payer: Self-pay

## 2024-03-13 ENCOUNTER — Emergency Department (HOSPITAL_BASED_OUTPATIENT_CLINIC_OR_DEPARTMENT_OTHER)

## 2024-03-13 ENCOUNTER — Telehealth: Payer: Self-pay | Admitting: *Deleted

## 2024-03-13 ENCOUNTER — Emergency Department (HOSPITAL_BASED_OUTPATIENT_CLINIC_OR_DEPARTMENT_OTHER): Admission: EM | Admit: 2024-03-13 | Discharge: 2024-03-13 | Disposition: A | Discharge status: Home / Self Care

## 2024-03-13 DIAGNOSIS — J439 Emphysema, unspecified: Secondary | ICD-10-CM | POA: Diagnosis not present

## 2024-03-13 DIAGNOSIS — D509 Iron deficiency anemia, unspecified: Secondary | ICD-10-CM | POA: Insufficient documentation

## 2024-03-13 DIAGNOSIS — D72829 Elevated white blood cell count, unspecified: Secondary | ICD-10-CM | POA: Insufficient documentation

## 2024-03-13 DIAGNOSIS — R0789 Other chest pain: Secondary | ICD-10-CM | POA: Diagnosis not present

## 2024-03-13 DIAGNOSIS — M549 Dorsalgia, unspecified: Secondary | ICD-10-CM | POA: Diagnosis not present

## 2024-03-13 DIAGNOSIS — C641 Malignant neoplasm of right kidney, except renal pelvis: Secondary | ICD-10-CM | POA: Diagnosis not present

## 2024-03-13 DIAGNOSIS — R590 Localized enlarged lymph nodes: Secondary | ICD-10-CM | POA: Insufficient documentation

## 2024-03-13 DIAGNOSIS — Z79620 Long term (current) use of immunosuppressive biologic: Secondary | ICD-10-CM | POA: Diagnosis not present

## 2024-03-13 DIAGNOSIS — C801 Malignant (primary) neoplasm, unspecified: Secondary | ICD-10-CM | POA: Diagnosis not present

## 2024-03-13 DIAGNOSIS — I700 Atherosclerosis of aorta: Secondary | ICD-10-CM | POA: Diagnosis not present

## 2024-03-13 DIAGNOSIS — R591 Generalized enlarged lymph nodes: Secondary | ICD-10-CM | POA: Diagnosis not present

## 2024-03-13 DIAGNOSIS — R531 Weakness: Secondary | ICD-10-CM | POA: Diagnosis not present

## 2024-03-13 DIAGNOSIS — R0602 Shortness of breath: Secondary | ICD-10-CM | POA: Diagnosis not present

## 2024-03-13 DIAGNOSIS — Z79899 Other long term (current) drug therapy: Secondary | ICD-10-CM | POA: Insufficient documentation

## 2024-03-13 DIAGNOSIS — Z8551 Personal history of malignant neoplasm of bladder: Secondary | ICD-10-CM | POA: Diagnosis not present

## 2024-03-13 DIAGNOSIS — C7951 Secondary malignant neoplasm of bone: Secondary | ICD-10-CM | POA: Insufficient documentation

## 2024-03-13 DIAGNOSIS — R000 Tachycardia, unspecified: Secondary | ICD-10-CM | POA: Diagnosis not present

## 2024-03-13 DIAGNOSIS — M545 Low back pain, unspecified: Secondary | ICD-10-CM

## 2024-03-13 LAB — COMPREHENSIVE METABOLIC PANEL WITH GFR
ALT: 9 U/L (ref 0–44)
AST: 17 U/L (ref 15–41)
Albumin: 3.6 g/dL (ref 3.5–5.0)
Alkaline Phosphatase: 79 U/L (ref 38–126)
Anion gap: 19 — ABNORMAL HIGH (ref 5–15)
BUN: 12 mg/dL (ref 8–23)
CO2: 21 mmol/L — ABNORMAL LOW (ref 22–32)
Calcium: 10.2 mg/dL (ref 8.9–10.3)
Chloride: 95 mmol/L — ABNORMAL LOW (ref 98–111)
Creatinine, Ser: 0.65 mg/dL (ref 0.44–1.00)
GFR, Estimated: >60 mL/min (ref >60)
Glucose, Bld: 83 mg/dL (ref 70–99)
Potassium: 3.6 mmol/L (ref 3.5–5.1)
Sodium: 135 mmol/L (ref 135–145)
Total Bilirubin: 0.4 mg/dL (ref 0.0–1.2)
Total Protein: 6.7 g/dL (ref 6.5–8.1)

## 2024-03-13 LAB — CBC WITH DIFFERENTIAL/PLATELET
Abs Immature Granulocytes: 0.06 K/uL (ref 0.00–0.07)
Basophils Absolute: 0 K/uL (ref 0.0–0.1)
Basophils Relative: 0 %
Eosinophils Absolute: 0.1 K/uL (ref 0.0–0.5)
Eosinophils Relative: 1 %
HCT: 39.4 % (ref 36.0–46.0)
Hemoglobin: 12.8 g/dL (ref 12.0–15.0)
Immature Granulocytes: 1 %
Lymphocytes Relative: 5 %
Lymphs Abs: 0.5 K/uL — ABNORMAL LOW (ref 0.7–4.0)
MCH: 29.7 pg (ref 26.0–34.0)
MCHC: 32.5 g/dL (ref 30.0–36.0)
MCV: 91.4 fL (ref 80.0–100.0)
Monocytes Absolute: 0.9 K/uL (ref 0.1–1.0)
Monocytes Relative: 9 %
Neutro Abs: 9.3 K/uL — ABNORMAL HIGH (ref 1.7–7.7)
Neutrophils Relative %: 84 %
Platelets: 333 K/uL (ref 150–400)
RBC: 4.31 MIL/uL (ref 3.87–5.11)
RDW: 14.3 % (ref 11.5–15.5)
WBC: 10.9 K/uL — ABNORMAL HIGH (ref 4.0–10.5)
nRBC: 0 % (ref 0.0–0.2)

## 2024-03-13 LAB — PRO BRAIN NATRIURETIC PEPTIDE: Pro Brain Natriuretic Peptide: 234 pg/mL (ref <300.0)

## 2024-03-13 LAB — URINALYSIS, ROUTINE W REFLEX MICROSCOPIC
Bacteria, UA: NONE SEEN
Bilirubin Urine: NEGATIVE
Glucose, UA: NEGATIVE mg/dL
Ketones, ur: >80 mg/dL — AB
Nitrite: NEGATIVE
Protein, ur: 30 mg/dL — AB
Specific Gravity, Urine: >1.046 — ABNORMAL HIGH (ref 1.005–1.030)
pH: 5.5 (ref 5.0–8.0)

## 2024-03-13 LAB — TROPONIN T, HIGH SENSITIVITY
Troponin T High Sensitivity: <15 ng/L (ref 0–19)
Troponin T High Sensitivity: <15 ng/L (ref 0–19)

## 2024-03-13 LAB — TSH: TSH: 0.615 u[IU]/mL (ref 0.350–4.500)

## 2024-03-13 LAB — T4, FREE: Free T4: 1.12 ng/dL (ref 0.61–1.12)

## 2024-03-13 LAB — CBG MONITORING, ED: Glucose-Capillary: 77 mg/dL (ref 70–99)

## 2024-03-13 MED ORDER — DIPHENHYDRAMINE HCL 50 MG/ML IJ SOLN
50.0000 mg | Freq: Once | INTRAMUSCULAR | Status: AC
  Administered 2024-03-13: 50 mg via INTRAVENOUS
  Filled 2024-03-13: qty 1

## 2024-03-13 MED ORDER — IOHEXOL 350 MG/ML SOLN
100.0000 mL | Freq: Once | INTRAVENOUS | Status: AC | PRN
Start: 2024-03-13 — End: 2024-03-13
  Administered 2024-03-13: 100 mL via INTRAVENOUS

## 2024-03-13 MED ORDER — DIPHENHYDRAMINE HCL 25 MG PO CAPS: 50.0000 mg | ORAL_CAPSULE | Freq: Once | ORAL | Status: AC

## 2024-03-13 MED ORDER — LIDOCAINE 5 % EX PTCH: 1.0000 | MEDICATED_PATCH | CUTANEOUS | 0 refills | Status: DC

## 2024-03-13 MED ORDER — METHYLPREDNISOLONE SODIUM SUCC 40 MG IJ SOLR
40.0000 mg | Freq: Once | INTRAMUSCULAR | Status: AC
  Administered 2024-03-13: 40 mg via INTRAVENOUS
  Filled 2024-03-13: qty 1

## 2024-03-13 MED ORDER — SODIUM CHLORIDE 0.9 % IV BOLUS
1000.0000 mL | Freq: Once | INTRAVENOUS | Status: AC
  Administered 2024-03-13: 1000 mL via INTRAVENOUS

## 2024-03-13 MED ORDER — HYDROMORPHONE HCL 1 MG/ML IJ SOLN
0.5000 mg | Freq: Once | INTRAMUSCULAR | Status: AC
  Administered 2024-03-13: 0.5 mg via INTRAVENOUS
  Filled 2024-03-13: qty 1

## 2024-03-13 MED ORDER — ACETAMINOPHEN 325 MG PO TABS
650.0000 mg | ORAL_TABLET | Freq: Once | ORAL | Status: AC
  Administered 2024-03-13: 650 mg via ORAL
  Filled 2024-03-13: qty 2

## 2024-03-13 MED ORDER — LIDOCAINE 5 % EX PTCH
1.0000 | MEDICATED_PATCH | Freq: Once | CUTANEOUS | Status: DC
  Administered 2024-03-13: 1 via TRANSDERMAL
  Filled 2024-03-13: qty 1

## 2024-03-13 MED ORDER — FENTANYL CITRATE PF 50 MCG/ML IJ SOSY
50.0000 ug | PREFILLED_SYRINGE | Freq: Once | INTRAMUSCULAR | Status: AC
  Administered 2024-03-13: 50 ug via INTRAVENOUS
  Filled 2024-03-13: qty 1

## 2024-03-13 NOTE — Discharge Instructions (Signed)
 Please follow-up with your primary doctors.  Take Tylenol  every 6 hours for pain.  We are prescribing you lidocaine  patches you can use as needed.  You also can take your prescribed Dilaudid  to help with your symptoms.  Return if develop fevers, chills, chest pain, shortness of breath, palpitations, lightheadedness, passout, severe pain, stop urinating or any new or worsening symptoms that are concerning to you.

## 2024-03-13 NOTE — ED Provider Notes (Signed)
 Received signout.  See morning team's note for full HPI.  Dispo pending CT scans and reevaluation.  Clinical Course as of 03/13/24 1940  Mon Mar 13, 2024  1532 Received sighnout; pending CTA.  [TY]  1556 Patient complaining of return of pain.  Evaluated.  Notes pain largely to the right lower back to me.  Continues to be tachycardic.  Medications ordered.  Will also add on CT lumbar spine to evaluate for pathologic fracture. [TY]  1812 CT Lumbar Spine Wo Contrast IMPRESSION: 1. The known S1 metastasis is only faintly visualized by CT. Loss of cortex at the S1 foramen with possible soft tissue component, possibly abutting the subjacent nerve root. 2. No evidence of lumbar bone lesion or bone destruction. 3. Upper retroperitoneal adenopathy is again seen, progressive from prior PET.  Aortic Atherosclerosis (ICD10-I70.0).   Electronically Signed   By: Andrea Gasman M.D.   On: 03/13/2024 18:02   [TY]  1813 CT Angio Chest PE W and/or Wo Contrast IMPRESSION: 1. No pulmonary embolus. 2. Progressive adenopathy in the chest and upper abdomen, consistent with worsening metastatic disease.  Aortic Atherosclerosis (ICD10-I70.0) and Emphysema (ICD10-J43.9).   Electronically Signed   By: Andrea Gasman M.D.   On: 03/13/2024 17:56   [TY]  1857 Heart rate is currently 92 she is feeling much improved after pain medications and IV fluids.  Awaiting UA.  Discussed discharge with close PCP follow-up.  Agreeable to plan.  Will discharge in stable condition plus or minus antibiotic based off of UA. [TY]  1923 Urinalysis, Routine w reflex microscopic -Urine, Clean Catch(!) Consistent with UTI.  Will discharge in stable condition [TY]    Clinical Course User Index [TY] Ann Caron PARAS, DO      Ann Caron PARAS, DO 03/14/24 0000

## 2024-03-13 NOTE — Telephone Encounter (Signed)
 Patient left VM at 0804 today that was just noted at 1130 by RN. Reports she is having side effects she wishes to discuss with provider. Started to return her call and noted she is currently in the emergency room. Will monitor and reach out when she is discharged. Next OV/treatment scheduled for 03/16/24. MD made aware.

## 2024-03-13 NOTE — ED Notes (Signed)
 Pt ambulated to restroom with steady gait.

## 2024-03-13 NOTE — ED Triage Notes (Signed)
 Pt c/o back pain that started last night with dizziness and weakness in BLE. Difficulty ambulating with SOB. Productive cough.   Pt receiving immunotherapy currently at [facility].  R PAC

## 2024-03-13 NOTE — ED Provider Notes (Signed)
 [place] EMERGENCY DEPARTMENT AT [facility] Provider Note   CSN: [id number] Arrival date & time: 03/13/24  1119     Patient presents with: Dizziness, Weakness, and Back Pain   Indica Marcott is a 68 y.o. female.    Dizziness Associated symptoms: weakness   Weakness Associated symptoms: dizziness   Back Pain Associated symptoms: weakness      68 year old female with medical history significant for herpes zoster, and, iron deficiency anemia, endometriosis, urothelial carcinoma of the right kidney on immunotherapy following outpatient with Dr. Lanny presents to the Emergency Department with severe back pain, shortness of breath, chest pain.  The patient has developed a productive cough, endorses dyspnea on ambulation, lightheadedness on attempted ambulation and standing.  She is currently undergoing immunotherapy for her urothelial carcinoma.  She has been having back pain that radiates from her right back to her chest.  She denies any fevers or chills.  No lower extremity swelling.  Prior to Admission medications   Medication Sig Start Date End Date Taking? Authorizing Provider  acetaminophen  (TYLENOL ) 500 MG tablet Take 500 mg by mouth in the morning, at noon, in the evening, and at bedtime.    [provider]  amLODipine  (NORVASC ) 5 MG tablet Take 1 tablet (5 mg total) by mouth daily. Patient not taking: Reported on 03/02/2024 10/15/23   Micheal Wolm ORN, MD  HYDROcodone  bit-homatropine (HYCODAN) 5-1.5 MG/5ML syrup Take 5 mLs by mouth every 6 (six) hours as needed. Patient not taking: Reported on 03/02/2024 02/23/24   Micheal Wolm ORN, MD  HYDROmorphone  (DILAUDID ) 2 MG tablet Take 0.5-1 tablets (1-2 mg total) by mouth every 6 (six) hours as needed for severe pain (pain score 7-10). 03/02/24   Debby Olam POUR, NP  ibuprofen  (ADVIL ) 200 MG tablet Take 200 mg by mouth 4 (four) times daily.    [provider]  lidocaine -prilocaine  (EMLA ) cream Apply 1  Application topically as needed (Apply to port 1 hour prior to its use, starting with 2nd treatment). 03/07/24   Cloretta Arley NOVAK, MD  LORazepam  (ATIVAN ) 0.5 MG tablet Take 1 tablet (0.5 mg total) by mouth every 6 (six) hours as needed for anxiety. 02/15/24   Burchette, Wolm ORN, MD  meloxicam  (MOBIC ) 15 MG tablet Take 1 tablet (15 mg total) by mouth daily. Patient not taking: Reported on 03/02/2024 12/15/22   Leonce Katz, DO  nystatin  cream (MYCOSTATIN ) Apply 1 Application topically 2 (two) times daily. Patient not taking: Reported on 03/09/2024 01/03/24   Micheal Wolm ORN, MD  ondansetron  (ZOFRAN ) 8 MG tablet Take 1 tablet (8 mg total) by mouth every 8 (eight) hours as needed for nausea or vomiting. 03/07/24   Cloretta Arley NOVAK, MD  oxybutynin  (DITROPAN ) 5 MG tablet Take 1 tablet (5 mg total) by mouth every 8 (eight) hours as needed for bladder spasms. 03/01/24   Devere Lonni Righter, MD  phenazopyridine  (PYRIDIUM ) 200 MG tablet Take 1 tablet (200 mg total) by mouth 3 (three) times daily as needed (for pain with urination). 03/01/24 03/01/25  Devere Lonni Righter, MD  prochlorperazine  (COMPAZINE ) 5 MG tablet Take 1 tablet (5 mg total) by mouth every 6 (six) hours as needed for nausea or vomiting. 03/02/24   Thomas, Lisa K, NP  SUMAtriptan  (IMITREX ) 25 MG tablet TAKE ONE AT ONSET OF MIGRAINE AND MAY REPEAT ONE IN TWO HOURS AS NEEDED (MAX OF 2 IN 24 HOURS) 11/25/22   Burchette, Wolm ORN, MD    Allergies: Iodine and Shellfish allergy  Review of Systems  Musculoskeletal:  Positive for back pain.  Neurological:  Positive for dizziness and weakness.  All other systems reviewed and are negative.   Updated Vital Signs BP 126/80   Pulse (!) 103   Temp 98.2 F (36.8 C)   Resp 19   Ht 5' 3 (1.6 m)   Wt 55.3 kg   SpO2 95%   BMI 21.61 kg/m   Physical Exam Vitals and nursing note reviewed.  Constitutional:      General: She is not in acute distress.    Appearance: She is well-developed.   HENT:     Head: Normocephalic and atraumatic.  Eyes:     Conjunctiva/sclera: Conjunctivae normal.  Cardiovascular:     Rate and Rhythm: Normal rate and regular rhythm.     Pulses: Normal pulses.     Heart sounds: No murmur heard. Pulmonary:     Effort: Pulmonary effort is normal. No respiratory distress.     Breath sounds: Normal breath sounds.  Abdominal:     Palpations: Abdomen is soft.     Tenderness: There is no abdominal tenderness.  Musculoskeletal:        General: No swelling.     Cervical back: Neck supple.  Skin:    General: Skin is warm and dry.     Capillary Refill: Capillary refill takes less than 2 seconds.  Neurological:     General: No focal deficit present.     Mental Status: She is alert. Mental status is at baseline.  Psychiatric:        Mood and Affect: Mood normal.     (all labs ordered are listed, but only abnormal results are displayed) Labs Reviewed  CBC WITH DIFFERENTIAL/PLATELET - Abnormal; Notable for the following components:      Result Value   WBC 10.9 (*)    Neutro Abs 9.3 (*)    Lymphs Abs 0.5 (*)    All other components within normal limits  COMPREHENSIVE METABOLIC PANEL WITH GFR - Abnormal; Notable for the following components:   Chloride 95 (*)    CO2 21 (*)    Anion gap 19 (*)    All other components within normal limits  TSH  PRO BRAIN NATRIURETIC PEPTIDE  T4, FREE  CBG MONITORING, ED  TROPONIN T, HIGH SENSITIVITY  TROPONIN T, HIGH SENSITIVITY    EKG: None  Radiology: No results found.   Procedures   Medications Ordered in the ED  diphenhydrAMINE (BENADRYL) capsule 50 mg (has no administration in time range)    Or  diphenhydrAMINE (BENADRYL) injection 50 mg (has no administration in time range)  methylPREDNISolone  sodium succinate (SOLU-MEDROL ) 40 mg/mL injection 40 mg (40 mg Intravenous Given 03/13/24 1327)  fentaNYL  (SUBLIMAZE ) injection 50 mcg (50 mcg Intravenous Given 03/13/24 1330)                                     Medical Decision Making Amount and/or Complexity of Data Reviewed Labs: ordered. Radiology: ordered.  Risk Prescription drug management.    68 year old female with medical history significant for herpes zoster, and, iron deficiency anemia, endometriosis, urothelial carcinoma of the right kidney on immunotherapy following outpatient with Dr. Lanny presents to the Emergency Department with severe back pain, shortness of breath, chest pain.  The patient has developed a productive cough, endorses dyspnea on ambulation, lightheadedness on attempted ambulation and standing.  She is currently undergoing immunotherapy  for her urothelial carcinoma.  She has been having back pain that radiates from her right back to her chest.  She denies any fevers or chills.  No lower extremity swelling.  On arrival, the patient was afebrile, temperature 98.2, tachycardic heart rate 126, not tachypneic RR 16, BP 112/75, saturating 97% on room air.  On exam the patient had clear lungs auscultation, no lower extremity edema.  She was in no distress.  Differential diagnosis includes pulmonary embolism in the setting of her known cancer and symptoms, pneumothorax, pneumonia, viral infection, less likely aortic dissection.  Considered hypovolemia.  Considered pneumonitis in the setting of her immunotherapy.  The patient has a notable allergy to iodine, has required premedication for contrasted CT imaging.  Due to concern for PE as my primary working diagnosis, the decision was made to proceed with emergent premedication for CTA PE study.  Initial EKG: Sinus tachycardia, ventricular rate 116, no STEMI or acute ischemic changes.  CTA PE study pending.  Labs: Initial cardiac troponin 15, repeat pending, BNP normal, CBC with a nonspecific leukocytosis to 10.9, TSH normal, CMP with a mild anion gap acidosis with a bicarbonate of 21 and an anion gap of 19 although otherwise unremarkable, initial CBG 77.  Will plan to  administer a 1 L NaCl bolus in the setting of the patient's tachycardia and anion gap acidosis and reassess.  Plan at time of signout to follow-up results of patient's CT imaging, reassess the patient, ultimate disposition pending results of diagnostic testing and reassessment.  Signout given to Dr. Neysa at [phone].     Final diagnoses:  None    ED Discharge Orders     None          Jerrol Agent, MD 03/13/24 1520

## 2024-03-13 NOTE — ED Notes (Signed)
 ED Provider at bedside.

## 2024-03-13 NOTE — ED Notes (Signed)
CBG- 77 

## 2024-03-14 ENCOUNTER — Telehealth: Payer: Self-pay

## 2024-03-14 NOTE — Telephone Encounter (Signed)
 Patient called and states she started getting a rash had back pain yesterday. She never experience this back pain and she ended up in the ER. Today she had no complaint the back pain mild. She was wonder if she need more lab work on Thursday since she did them when she went to the er on Monday

## 2024-03-16 ENCOUNTER — Inpatient Hospital Stay: Admitting: Nurse Practitioner

## 2024-03-16 ENCOUNTER — Other Ambulatory Visit: Payer: Self-pay

## 2024-03-16 ENCOUNTER — Inpatient Hospital Stay

## 2024-03-16 ENCOUNTER — Inpatient Hospital Stay: Attending: Oncology

## 2024-03-16 ENCOUNTER — Encounter: Payer: Self-pay | Admitting: Nurse Practitioner

## 2024-03-16 VITALS — BP 98/62 | HR 113 | Temp 98.0°F | Resp 18 | Ht 63.0 in | Wt 118.7 lb

## 2024-03-16 DIAGNOSIS — C641 Malignant neoplasm of right kidney, except renal pelvis: Secondary | ICD-10-CM | POA: Diagnosis not present

## 2024-03-16 DIAGNOSIS — C7951 Secondary malignant neoplasm of bone: Secondary | ICD-10-CM | POA: Insufficient documentation

## 2024-03-16 DIAGNOSIS — C651 Malignant neoplasm of right renal pelvis: Secondary | ICD-10-CM | POA: Insufficient documentation

## 2024-03-16 DIAGNOSIS — C770 Secondary and unspecified malignant neoplasm of lymph nodes of head, face and neck: Secondary | ICD-10-CM | POA: Insufficient documentation

## 2024-03-16 DIAGNOSIS — Z5112 Encounter for antineoplastic immunotherapy: Secondary | ICD-10-CM | POA: Diagnosis not present

## 2024-03-16 DIAGNOSIS — R300 Dysuria: Secondary | ICD-10-CM | POA: Diagnosis not present

## 2024-03-16 DIAGNOSIS — Z79620 Long term (current) use of immunosuppressive biologic: Secondary | ICD-10-CM | POA: Insufficient documentation

## 2024-03-16 LAB — CBC WITH DIFFERENTIAL (CANCER CENTER ONLY)
Abs Immature Granulocytes: 0.05 K/uL (ref 0.00–0.07)
Basophils Absolute: 0 K/uL (ref 0.0–0.1)
Basophils Relative: 0 %
Eosinophils Absolute: 0.2 K/uL (ref 0.0–0.5)
Eosinophils Relative: 2 %
HCT: 38.2 % (ref 36.0–46.0)
Hemoglobin: 12.6 g/dL (ref 12.0–15.0)
Immature Granulocytes: 0 %
Lymphocytes Relative: 7 %
Lymphs Abs: 0.9 K/uL (ref 0.7–4.0)
MCH: 29.8 pg (ref 26.0–34.0)
MCHC: 33 g/dL (ref 30.0–36.0)
MCV: 90.3 fL (ref 80.0–100.0)
Monocytes Absolute: 1 K/uL (ref 0.1–1.0)
Monocytes Relative: 8 %
Neutro Abs: 9.8 K/uL — ABNORMAL HIGH (ref 1.7–7.7)
Neutrophils Relative %: 83 %
Platelet Count: 372 K/uL (ref 150–400)
RBC: 4.23 MIL/uL (ref 3.87–5.11)
RDW: 14.2 % (ref 11.5–15.5)
WBC Count: 12 K/uL — ABNORMAL HIGH (ref 4.0–10.5)
nRBC: 0 % (ref 0.0–0.2)

## 2024-03-16 LAB — CMP (CANCER CENTER ONLY)
ALT: 6 U/L (ref 0–44)
AST: 14 U/L — ABNORMAL LOW (ref 15–41)
Albumin: 3.4 g/dL — ABNORMAL LOW (ref 3.5–5.0)
Alkaline Phosphatase: 77 U/L (ref 38–126)
Anion gap: 14 (ref 5–15)
BUN: 18 mg/dL (ref 8–23)
CO2: 24 mmol/L (ref 22–32)
Calcium: 9.4 mg/dL (ref 8.9–10.3)
Chloride: 96 mmol/L — ABNORMAL LOW (ref 98–111)
Creatinine: 0.66 mg/dL (ref 0.44–1.00)
GFR, Estimated: >60 mL/min (ref >60)
Glucose, Bld: 117 mg/dL — ABNORMAL HIGH (ref 70–99)
Potassium: 3.1 mmol/L — ABNORMAL LOW (ref 3.5–5.1)
Sodium: 134 mmol/L — ABNORMAL LOW (ref 135–145)
Total Bilirubin: 0.4 mg/dL (ref 0.0–1.2)
Total Protein: 6.1 g/dL — ABNORMAL LOW (ref 6.5–8.1)

## 2024-03-16 LAB — C DIFFICILE QUICK SCREEN W PCR REFLEX
C Diff antigen: NEGATIVE
C Diff interpretation: NOT DETECTED
C Diff toxin: NEGATIVE

## 2024-03-16 MED ORDER — LORAZEPAM 0.5 MG PO TABS: 0.5000 mg | ORAL_TABLET | Freq: Four times a day (QID) | ORAL | 0 refills | Status: DC | PRN

## 2024-03-16 MED ORDER — SODIUM CHLORIDE 0.9 % IV SOLN: INTRAVENOUS | Status: AC

## 2024-03-16 MED ORDER — POTASSIUM CHLORIDE 10 MEQ/100ML IV SOLN
10.0000 meq | INTRAVENOUS | Status: AC
  Administered 2024-03-16 (×2): 10 meq via INTRAVENOUS
  Filled 2024-03-16: qty 100

## 2024-03-16 MED ORDER — POTASSIUM CHLORIDE CRYS ER 20 MEQ PO TBCR: 20.0000 meq | EXTENDED_RELEASE_TABLET | Freq: Every day | ORAL | 0 refills | Status: DC

## 2024-03-16 NOTE — Progress Notes (Signed)
 [facility] OFFICE PROGRESS NOTE   Diagnosis: Urothelial carcinoma of the right renal pelvis  INTERVAL HISTORY:   Ann Lee returns as scheduled.  She completed cycle 1 day 1 Pembrolizumab  and enfortumab 03/09/2024.  She was seen in the emergency department 03/13/2024 with dizziness, weakness and back pain.  CT chest was negative for PE.  There was progressive adenopathy in the chest and upper abdomen.  CT lumbar spine showed the known S1 metastasis only faintly visualized by CT.  There was loss of cortex at the S1 foramen with possible soft tissue component potentially abutting the subjacent nerve root.  Upper retroperitoneal adenopathy again seen, progressive from prior PET.  She reports developing nausea/vomiting around day 3.  Oral intake is very poor.  She noted a rash at the lower abdomen and groin regions 2 days ago and also began having small volumes frequent bowel movements.  No blister type skin lesions.  No hand or foot pain or redness.  No mouth sores.  She estimates at least 40 bowel movements yesterday.  She took 1 Imodium.  No bowel movement so far today.  She denies fever.  No blood in the stool.  She has pain on the right side.  She has dyspnea on exertion over the past few days.  She had a dry cough for 1 day.  No numbness or tingling in the hands or feet.    Objective:  Vital signs in last 24 hours:  Blood pressure 98/62, pulse (!) 113, temperature 98 F (36.7 C), temperature source Temporal, resp. rate 18, height 5' 3 (1.6 m), weight 118 lb 11.2 oz (53.8 kg), SpO2 100%.    HEENT: No thrush or ulcers.  Mucous membranes appear moist. Resp: Lungs clear bilaterally. Cardio: Regular, tachycardic. GI: No hepatosplenomegaly.  Nontender. Vascular: No leg edema. Skin: Decreased skin turgor.  Erythematous confluent rash across the abdomen and lower back, bilateral groin regions extending to the upper thighs.  Palms without erythema, no blisters. Port-A-Cath  without erythema.  Lab Results:  Lab Results  Component Value Date   WBC 12.0 (H) 03/16/2024   HGB 12.6 03/16/2024   HCT 38.2 03/16/2024   MCV 90.3 03/16/2024   PLT 372 03/16/2024   NEUTROABS 9.8 (H) 03/16/2024    Imaging:  No results found.  Medications: I have reviewed the patient's current medications.  Assessment/Plan: Right renal mass CTs abdomen/pelvis 02/01/2024-mild right-sided hydroureteronephrosis extending to the mid to distal ureter; multiple enlarged retroperitoneal lymph nodes involving the periaortic and aortocaval spaces; ovoid hyperdensity along the lower pole of the right kidney measuring 1.8 cm.   CT renal 02/08/2024-1.6 x 1.7 x 2.0 cm hyperattenuating moderately enhancing mass in the right kidney lower pole calyx with subtle extension of tumor into the anteroinferior wall of the right renal pelvis; moderate right hydronephrosis and hydroureter up to the level of the right mid ureter; additional linear bandlike enhancing area in the right upper ureter; extensive heterogeneous retroperitoneal lymphadenopathy.   PET scan 02/11/2024-hypermetabolic left supraclavicular, AP window, retrocrural, right gastric and retroperitoneal adenopathy; solitary hypermetabolic skeletal lesion in the vicinity of the right anterior S1 sacral foramen. Biopsy left supraclavicular lymph node 02/24/2024-metastatic carcinoma compatible with clinical suspicion of metastatic urothelial carcinoma Cystoscopy/right ureter stent placement 03/01/2024-No intravesical or urethral abnormality seen; solitary right collecting system with no filling defects or dilation involving the right ureter or right renal pelvis; papillary tumor involving the right mid pole calyx with features concerning for urothelial carcinoma. Cycle 1 day 1 pembrolizumab  and  enfortumab 03/09/2024; day 8 held 03/16/2024 due to rash and diarrhea CT chest 03/13/2024-negative for PE; progressive adenopathy in the chest and upper abdomen CT  lumbar spine 03/13/2024-known S1 metastasis only faintly visualized.  Loss of cortex at the S1 foramen with possible soft tissue component, possibly abutting the subjacent nerve root.  Upper retroperitoneal adenopathy again seen, progressive. 03/16/2024 cycle 1 day 8 enfortumab held due to diarrhea and rash Back pain secondary to #1 Change in bowel habits, postprandial abdominal pain Emphysema Tobacco use Rash and diarrhea following cycle 1 day 1 Pembrolizumab /enfortumab  Disposition: [name] completed cycle 1 day 1 Pembrolizumab  and enfortumab 03/09/2024.  Post treatment course complicated by nausea/vomiting, more recent rash and severe diarrhea.  We are holding day 8 enfortumab today.    She appears dehydrated.  CBC and chemistry panel reviewed, mild hypokalemia.  She will receive IV fluids and IV potassium today.  She will begin Kdur 20 meq daily for 7 days beginning 03/17/2024.  The rash is mildly pruritic.  She will try topical Benadryl.  The rash could be related to Pembrolizumab , enfortumab, CT contrast.  She will contact the office if the rash worsens.  The pain at the right abdomen could be related to the stent, kidney mass, adenopathy.  She has pain medicine to take as needed.  Etiology of the recent diarrhea unclear, potentially related to treatment.  No diarrhea so far today.  Plan to collect stool sample for C. difficile if possible.  She will return for follow-up 03/29/2024.  We are available to see her sooner if needed.  Patient seen with Dr. Cloretta Olam Ned ANP/GNP-BC   03/16/2024  8:57 AM  This was a shared visit with Olam Ned.  [name] was interviewed and examined.  She developed a rash following cycle 1 pembrolizumab /enfortumab.  She has pain in the back and right abdomen/flank.  Day 8 enfortumab will be placed on hold.  The rash is likely secondary to enfortumab, but pembrolizumab  may be contributing.  We reviewed CT images from the 03/13/2024 emergency room  visit.  I was present for greater than 50% of today's visit.  I performed Medical Decision Making.  Arvella Cloretta, MD

## 2024-03-16 NOTE — Patient Instructions (Signed)

## 2024-03-16 NOTE — Progress Notes (Signed)
 Patient seen by Olam Ned NP today  Vitals are within treatment parameters:No (Please specify and give further instructions.)Pulse 113  Labs are within treatment parameters: No (Please specify and give further instructions.) Potassium 3.1  Treatment plan has been signed: Yes   Per physician team, Patient will not be receiving treatment today. Patient will received 1L NS and 2mEq x potassium

## 2024-03-17 ENCOUNTER — Telehealth: Payer: Self-pay | Admitting: *Deleted

## 2024-03-17 NOTE — Telephone Encounter (Signed)
 Informed Ann Lee that the stool sample was negative for C. difficile.  Inquired if she is still having diarrhea? She said stools are down to 3/day and very small amounts. Reminded her to hydrate and f/u as scheduled.

## 2024-03-20 ENCOUNTER — Encounter: Payer: Self-pay | Admitting: Oncology

## 2024-03-21 ENCOUNTER — Encounter: Payer: Self-pay | Admitting: Oncology

## 2024-03-22 ENCOUNTER — Telehealth: Payer: Self-pay | Admitting: Oncology

## 2024-03-22 NOTE — Telephone Encounter (Signed)
 Called PT let her know about Nutrition appt, left detailed message on PT voicemail

## 2024-03-25 ENCOUNTER — Other Ambulatory Visit: Payer: Self-pay | Admitting: Oncology

## 2024-03-27 ENCOUNTER — Encounter: Admitting: Nutrition

## 2024-03-28 ENCOUNTER — Other Ambulatory Visit: Payer: Self-pay

## 2024-03-29 ENCOUNTER — Inpatient Hospital Stay

## 2024-03-29 ENCOUNTER — Encounter: Payer: Self-pay | Admitting: Nurse Practitioner

## 2024-03-29 ENCOUNTER — Inpatient Hospital Stay (HOSPITAL_BASED_OUTPATIENT_CLINIC_OR_DEPARTMENT_OTHER): Admitting: Nurse Practitioner

## 2024-03-29 VITALS — BP 107/77 | HR 98 | Temp 98.0°F | Resp 18 | Ht 63.0 in | Wt 115.9 lb

## 2024-03-29 DIAGNOSIS — C770 Secondary and unspecified malignant neoplasm of lymph nodes of head, face and neck: Secondary | ICD-10-CM | POA: Diagnosis not present

## 2024-03-29 DIAGNOSIS — C641 Malignant neoplasm of right kidney, except renal pelvis: Secondary | ICD-10-CM

## 2024-03-29 DIAGNOSIS — Z5112 Encounter for antineoplastic immunotherapy: Secondary | ICD-10-CM | POA: Diagnosis not present

## 2024-03-29 DIAGNOSIS — R300 Dysuria: Secondary | ICD-10-CM | POA: Diagnosis not present

## 2024-03-29 DIAGNOSIS — Z79620 Long term (current) use of immunosuppressive biologic: Secondary | ICD-10-CM | POA: Diagnosis not present

## 2024-03-29 DIAGNOSIS — C651 Malignant neoplasm of right renal pelvis: Secondary | ICD-10-CM | POA: Diagnosis not present

## 2024-03-29 DIAGNOSIS — C7951 Secondary malignant neoplasm of bone: Secondary | ICD-10-CM | POA: Diagnosis not present

## 2024-03-29 LAB — CMP (CANCER CENTER ONLY)
ALT: 6 U/L (ref 0–44)
AST: 12 U/L — ABNORMAL LOW (ref 15–41)
Albumin: 3.2 g/dL — ABNORMAL LOW (ref 3.5–5.0)
Alkaline Phosphatase: 129 U/L — ABNORMAL HIGH (ref 38–126)
Anion gap: 9 (ref 5–15)
BUN: 10 mg/dL (ref 8–23)
CO2: 24 mmol/L (ref 22–32)
Calcium: 8.8 mg/dL — ABNORMAL LOW (ref 8.9–10.3)
Chloride: 103 mmol/L (ref 98–111)
Creatinine: 0.65 mg/dL (ref 0.44–1.00)
GFR, Estimated: >60 mL/min (ref >60)
Glucose, Bld: 124 mg/dL — ABNORMAL HIGH (ref 70–99)
Potassium: 3.6 mmol/L (ref 3.5–5.1)
Sodium: 136 mmol/L (ref 135–145)
Total Bilirubin: <0.2 mg/dL (ref 0.0–1.2)
Total Protein: 5.9 g/dL — ABNORMAL LOW (ref 6.5–8.1)

## 2024-03-29 LAB — CBC WITH DIFFERENTIAL (CANCER CENTER ONLY)
Abs Immature Granulocytes: 0.06 K/uL (ref 0.00–0.07)
Basophils Absolute: 0.1 K/uL (ref 0.0–0.1)
Basophils Relative: 1 %
Eosinophils Absolute: 0.2 K/uL (ref 0.0–0.5)
Eosinophils Relative: 2 %
HCT: 35.2 % — ABNORMAL LOW (ref 36.0–46.0)
Hemoglobin: 11.2 g/dL — ABNORMAL LOW (ref 12.0–15.0)
Immature Granulocytes: 1 %
Lymphocytes Relative: 14 %
Lymphs Abs: 1.3 K/uL (ref 0.7–4.0)
MCH: 28.8 pg (ref 26.0–34.0)
MCHC: 31.8 g/dL (ref 30.0–36.0)
MCV: 90.5 fL (ref 80.0–100.0)
Monocytes Absolute: 1 K/uL (ref 0.1–1.0)
Monocytes Relative: 11 %
Neutro Abs: 6.8 K/uL (ref 1.7–7.7)
Neutrophils Relative %: 71 %
Platelet Count: 330 K/uL (ref 150–400)
RBC: 3.89 MIL/uL (ref 3.87–5.11)
RDW: 14.4 % (ref 11.5–15.5)
WBC Count: 9.5 K/uL (ref 4.0–10.5)
nRBC: 0 % (ref 0.0–0.2)

## 2024-03-29 MED ORDER — ONDANSETRON HCL 8 MG PO TABS: 8.0000 mg | ORAL_TABLET | Freq: Three times a day (TID) | ORAL | 3 refills | Status: DC | PRN

## 2024-03-29 NOTE — Progress Notes (Signed)
 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  She completed cycle 1 day 1 Pembrolizumab /enfortumab 03/09/2024. Day 8 enfortumab held 03/16/2024 due to diarrhea and rash.  The diarrhea improved 1 to 2 days after her last office visit.  She continues to have 15-20 small formed bowel movements a day.  She had similar issues with frequent bowel movements prior to beginning treatment.  The rash lasted about 5 to 7 days, initially worsened over the lower abdomen and then resolved.  She applied a Benadryl cream.  She never developed blister type lesions.  The rash was not hive-like.  No mouth sores.  She denies neuropathy symptoms.  She reports achy upper leg pain over the past 2 weeks.  Stable baseline dyspnea on exertion.  No significant cough.  No eye symptoms.  Pain overall is better.  She occasionally takes Tylenol  or Advil .  She notes that the right kidney is tender.  Objective:  Vital signs in last 24 hours:  Blood pressure 107/77, pulse 98, temperature 98 F (36.7 C), temperature source Temporal, resp. rate 18, height 5' 3 (1.6 m), weight 115 lb 14.4 oz (52.6 kg), SpO2 100%.   HEENT: No ulcers. Resp: Lungs clear bilaterally. Cardio: Regular rate and rhythm. GI: Abdomen soft and nontender.  No hepatosplenomegaly. Vascular: No leg edema. Musculoskeletal: Tender over the right flank region. Skin: No rash.  Palms without erythema, no blisters. Port-A-Cath without erythema.  Lab Results:  Lab Results  Component Value Date   WBC 9.5 03/29/2024   HGB 11.2 (L) 03/29/2024   HCT 35.2 (L) 03/29/2024   MCV 90.5 03/29/2024   PLT 330 03/29/2024   NEUTROABS 6.8 03/29/2024    Imaging:  No results found.  Medications: I have reviewed the patient's current medications.  Assessment/Plan: Right renal mass CTs abdomen/pelvis 02/01/2024-mild right-sided  hydroureteronephrosis extending to the mid to distal ureter; multiple enlarged retroperitoneal lymph nodes involving the periaortic and aortocaval spaces; ovoid hyperdensity along the lower pole of the right kidney measuring 1.8 cm.   CT renal 02/08/2024-1.6 x 1.7 x 2.0 cm hyperattenuating moderately enhancing mass in the right kidney lower pole calyx with subtle extension of tumor into the anteroinferior wall of the right renal pelvis; moderate right hydronephrosis and hydroureter up to the level of the right mid ureter; additional linear bandlike enhancing area in the right upper ureter; extensive heterogeneous retroperitoneal lymphadenopathy.   PET scan 02/11/2024-hypermetabolic left supraclavicular, AP window, retrocrural, right gastric and retroperitoneal adenopathy; solitary hypermetabolic skeletal lesion in the vicinity of the right anterior S1 sacral foramen. Biopsy left supraclavicular lymph node 02/24/2024-metastatic carcinoma compatible with clinical suspicion of metastatic urothelial carcinoma Cystoscopy/right ureter stent placement 03/01/2024-No intravesical or urethral abnormality seen; solitary right collecting system with no filling defects or dilation involving the right ureter or right renal pelvis; papillary tumor involving the right mid pole calyx with features concerning for urothelial carcinoma. Cycle 1 day 1 pembrolizumab  and enfortumab 03/09/2024; day 8 held 03/16/2024 due to rash and diarrhea CT chest 03/13/2024-negative for PE; progressive adenopathy in the chest and upper abdomen CT lumbar spine 03/13/2024-known S1 metastasis only faintly visualized.  Loss of cortex at the S1 foramen with possible soft tissue component, possibly abutting the subjacent nerve root.  Upper retroperitoneal adenopathy again seen, progressive. 03/16/2024 cycle 1 day 8 enfortumab held due to diarrhea and rash 03/29/2024 cycle 2-day 1 Pembrolizumab /enfortumab, enfortumab dose reduced due to rash  and diarrhea  following cycle 1 day 1 Back pain secondary to #1 Change in bowel habits, postprandial abdominal pain Emphysema Tobacco use Rash and diarrhea following cycle 1 day 1 Pembrolizumab /enfortumab  Disposition: Ms. Rolston appears stable.  She completed cycle 1 day 1 Pembrolizumab /enfortumab 03/09/2024.  Day 8 treatment was held due to rash and diarrhea.  The rash has resolved.  She is no longer having diarrhea but does report frequent small bowel movements.  This predates treatment.  Plan to proceed with cycle 2-day 1 Pembrolizumab /enfortumab today as scheduled.  Enfortumab will be dose reduced.  CBC and chemistry panel reviewed.  Labs are adequate for treatment.  She will return for follow-up and the day 8 treatment in 1 week.  We are available to see her sooner if needed.  She understands to contact the office with recurrent skin rash and/or diarrhea.  Patient seen with Dr. Cloretta.    Ann Lee ANP/GNP-BC   03/29/2024  11:38 AM  This was a shared visit with Ann Lee.  Ann Lee was interviewed and examined.  Her overall clinical status appears improved since beginning systemic therapy.  Pain has improved.  The plan is to proceed with cycle 2 pembrolizumab /enfortumab tomorrow.  Enfortumab will be dose reduced secondary to the rash and diarrhea following cycle 1.  The rash and diarrhea are unlikely related to pembrolizumab .  She has frequent formed bowel movements of unclear etiology.  This predated chemotherapy.  I adjusted the chemotherapy regimen today.  Ann Cloretta, MD

## 2024-03-30 ENCOUNTER — Other Ambulatory Visit

## 2024-03-30 ENCOUNTER — Inpatient Hospital Stay

## 2024-03-30 ENCOUNTER — Ambulatory Visit: Admitting: Nurse Practitioner

## 2024-03-30 ENCOUNTER — Ambulatory Visit

## 2024-03-30 ENCOUNTER — Encounter: Payer: Self-pay | Admitting: Oncology

## 2024-03-30 VITALS — BP 118/80 | HR 76 | Temp 98.1°F | Resp 18

## 2024-03-30 DIAGNOSIS — C641 Malignant neoplasm of right kidney, except renal pelvis: Secondary | ICD-10-CM

## 2024-03-30 DIAGNOSIS — R300 Dysuria: Secondary | ICD-10-CM | POA: Diagnosis not present

## 2024-03-30 DIAGNOSIS — C770 Secondary and unspecified malignant neoplasm of lymph nodes of head, face and neck: Secondary | ICD-10-CM | POA: Diagnosis not present

## 2024-03-30 DIAGNOSIS — Z5112 Encounter for antineoplastic immunotherapy: Secondary | ICD-10-CM | POA: Diagnosis not present

## 2024-03-30 DIAGNOSIS — C651 Malignant neoplasm of right renal pelvis: Secondary | ICD-10-CM | POA: Diagnosis not present

## 2024-03-30 DIAGNOSIS — C7951 Secondary malignant neoplasm of bone: Secondary | ICD-10-CM | POA: Diagnosis not present

## 2024-03-30 MED ORDER — SODIUM CHLORIDE 0.9 % IV SOLN
INTRAVENOUS | Status: AC
Start: 2024-03-30 — End: ?

## 2024-03-30 MED ORDER — PROCHLORPERAZINE MALEATE 10 MG PO TABS: 10.0000 mg | ORAL_TABLET | Freq: Once | ORAL | Status: DC

## 2024-03-30 MED ORDER — SODIUM CHLORIDE 0.9 % IV SOLN
1.0000 mg/kg | Freq: Once | INTRAVENOUS | Status: AC
  Administered 2024-03-30: 50 mg via INTRAVENOUS
  Filled 2024-03-30: qty 3

## 2024-03-30 MED ORDER — SODIUM CHLORIDE 0.9 % IV SOLN
200.0000 mg | Freq: Once | INTRAVENOUS | Status: AC
  Administered 2024-03-30: 200 mg via INTRAVENOUS
  Filled 2024-03-30: qty 8

## 2024-03-30 NOTE — Progress Notes (Unsigned)
 Pt's B/P upon arrival 85/60 and 90/62 rechecked manually. She does tend to run low in the morings per pt. Pulse 98-107. Notified Olam Ned, NP and Lacie Burton, NP. Lacie over to see patient. Pt is tolerating po liquids well and has already started drinking this morning. No additional orders at this time.

## 2024-04-02 ENCOUNTER — Other Ambulatory Visit: Payer: Self-pay

## 2024-04-03 ENCOUNTER — Telehealth: Payer: Self-pay | Admitting: Oncology

## 2024-04-03 ENCOUNTER — Inpatient Hospital Stay: Admitting: Nutrition

## 2024-04-03 NOTE — Progress Notes (Signed)
 Patient cancelled nutrition appointment because she doesn't feel well.

## 2024-04-03 NOTE — Telephone Encounter (Signed)
 Spoke with PT son, PT is not feeling good at all, will call back to reschedule.

## 2024-04-04 ENCOUNTER — Telehealth: Payer: Self-pay | Admitting: *Deleted

## 2024-04-04 MED ORDER — CIPROFLOXACIN HCL 500 MG PO TABS: 500.0000 mg | ORAL_TABLET | Freq: Two times a day (BID) | ORAL | 0 refills | Status: DC

## 2024-04-04 NOTE — Telephone Encounter (Signed)
 Per Dr. Cloretta: Send in script for Cipro 500 mg bid x 5 days. Patient notified.

## 2024-04-04 NOTE — Telephone Encounter (Signed)
 Reports having dysuria and frequency and can only void few drops at a time. Asking if she could be treated for UTI? Does not have ability to come in today re: transportation. Also reports she is having some significant hair loss now and she thought that would not happen. Informed her it is very possible w/ the Padcev , but not the Keytruda . [facility] [address]

## 2024-04-06 ENCOUNTER — Inpatient Hospital Stay: Admitting: Oncology

## 2024-04-06 ENCOUNTER — Inpatient Hospital Stay

## 2024-04-06 ENCOUNTER — Other Ambulatory Visit: Payer: Self-pay

## 2024-04-06 VITALS — BP 111/75 | HR 93 | Temp 97.9°F | Resp 18 | Ht 63.0 in | Wt 115.8 lb

## 2024-04-06 VITALS — BP 108/68 | HR 86 | Resp 18

## 2024-04-06 DIAGNOSIS — C641 Malignant neoplasm of right kidney, except renal pelvis: Secondary | ICD-10-CM | POA: Diagnosis not present

## 2024-04-06 DIAGNOSIS — R300 Dysuria: Secondary | ICD-10-CM | POA: Diagnosis not present

## 2024-04-06 DIAGNOSIS — C651 Malignant neoplasm of right renal pelvis: Secondary | ICD-10-CM | POA: Diagnosis not present

## 2024-04-06 DIAGNOSIS — Z5112 Encounter for antineoplastic immunotherapy: Secondary | ICD-10-CM | POA: Diagnosis not present

## 2024-04-06 LAB — CMP (CANCER CENTER ONLY)
ALT: 5 U/L (ref 0–44)
AST: 14 U/L — ABNORMAL LOW (ref 15–41)
Albumin: 3.2 g/dL — ABNORMAL LOW (ref 3.5–5.0)
Alkaline Phosphatase: 97 U/L (ref 38–126)
Anion gap: 10 (ref 5–15)
BUN: 12 mg/dL (ref 8–23)
CO2: 24 mmol/L (ref 22–32)
Calcium: 9.2 mg/dL (ref 8.9–10.3)
Chloride: 97 mmol/L — ABNORMAL LOW (ref 98–111)
Creatinine: 0.76 mg/dL (ref 0.44–1.00)
GFR, Estimated: >60 mL/min (ref >60)
Glucose, Bld: 133 mg/dL — ABNORMAL HIGH (ref 70–99)
Potassium: 3.3 mmol/L — ABNORMAL LOW (ref 3.5–5.1)
Sodium: 131 mmol/L — ABNORMAL LOW (ref 135–145)
Total Bilirubin: 0.3 mg/dL (ref 0.0–1.2)
Total Protein: 6.1 g/dL — ABNORMAL LOW (ref 6.5–8.1)

## 2024-04-06 LAB — CBC WITH DIFFERENTIAL (CANCER CENTER ONLY)
Abs Immature Granulocytes: 0.07 K/uL (ref 0.00–0.07)
Basophils Absolute: 0.1 K/uL (ref 0.0–0.1)
Basophils Relative: 1 %
Eosinophils Absolute: 1.1 K/uL — ABNORMAL HIGH (ref 0.0–0.5)
Eosinophils Relative: 10 %
HCT: 33.7 % — ABNORMAL LOW (ref 36.0–46.0)
Hemoglobin: 10.8 g/dL — ABNORMAL LOW (ref 12.0–15.0)
Immature Granulocytes: 1 %
Lymphocytes Relative: 10 %
Lymphs Abs: 1 K/uL (ref 0.7–4.0)
MCH: 29 pg (ref 26.0–34.0)
MCHC: 32 g/dL (ref 30.0–36.0)
MCV: 90.6 fL (ref 80.0–100.0)
Monocytes Absolute: 0.9 K/uL (ref 0.1–1.0)
Monocytes Relative: 9 %
Neutro Abs: 7.3 K/uL (ref 1.7–7.7)
Neutrophils Relative %: 69 %
Platelet Count: 306 K/uL (ref 150–400)
RBC: 3.72 MIL/uL — ABNORMAL LOW (ref 3.87–5.11)
RDW: 14.5 % (ref 11.5–15.5)
WBC Count: 10.4 K/uL (ref 4.0–10.5)
nRBC: 0 % (ref 0.0–0.2)

## 2024-04-06 LAB — URINALYSIS, COMPLETE (UACMP) WITH MICROSCOPIC
Glucose, UA: NEGATIVE mg/dL
Ketones, ur: 15 mg/dL — AB
Nitrite: NEGATIVE
Protein, ur: 100 mg/dL — AB
RBC / HPF: >50 RBC/hpf (ref 0–5)
Specific Gravity, Urine: 1.027 (ref 1.005–1.030)
WBC, UA: >50 WBC/hpf (ref 0–5)
pH: 6 (ref 5.0–8.0)

## 2024-04-06 MED ORDER — SODIUM CHLORIDE 0.9 % IV SOLN
1.0000 mg/kg | Freq: Once | INTRAVENOUS | Status: AC
  Administered 2024-04-06: 50 mg via INTRAVENOUS
  Filled 2024-04-06: qty 3

## 2024-04-06 MED ORDER — SODIUM CHLORIDE 0.9 % IV SOLN: INTRAVENOUS | Status: DC

## 2024-04-06 MED ORDER — PROCHLORPERAZINE MALEATE 10 MG PO TABS: 10.0000 mg | ORAL_TABLET | Freq: Once | ORAL | Status: DC

## 2024-04-06 NOTE — Patient Instructions (Signed)

## 2024-04-06 NOTE — Patient Instructions (Signed)
 [facility] - A DEPT OF [place]  Discharge Instructions: Thank you for choosing [facility] to provide your oncology and hematology care.   If you have a lab appointment with the Cancer Center, please go directly to the Cancer Center and check in at the registration area.   Wear comfortable clothing and clothing appropriate for easy access to any Portacath or PICC line.   We strive to give you quality time with your provider. You may need to reschedule your appointment if you arrive late (15 or more minutes).  Arriving late affects you and other patients whose appointments are after yours.  Also, if you miss three or more appointments without notifying the office, you may be dismissed from the clinic at the provider's discretion.      For prescription refill requests, have your pharmacy contact our office and allow 72 hours for refills to be completed.    Today you received the following chemotherapy and/or immunotherapy agents: padcev    Enfortumab Vedotin  Injection What is this medication? ENFORTUMAB VEDOTIN  (en FORT ue mab ve DOE tin) treats bladder cancer and kidney cancer. It works by blocking a protein that causes cancer cells to grow and multiply. This helps to slow or stop the spread of cancer cells. This medicine may be used for other purposes; ask your health care provider or pharmacist if you have questions. COMMON BRAND NAME(S): PADCEV  What should I tell my care team before I take this medication? They need to know if you have any of these conditions: Diabetes Eye disease Liver disease Lung disease Tingling of the fingers or toes or other nerve disorder Vision problems An unusual or allergic reaction to enfortumab vedotin , other medications, foods, dyes, or preservatives Pregnant or trying to get pregnant Breast-feeding How should I use this medication? This medication is injected into a vein. It is given by your care team in  a hospital or clinic setting. Talk to your care team about the use of this medication in children. Special care may be needed. Overdosage: If you think you have taken too much of this medicine contact a poison control center or emergency room at once. NOTE: This medicine is only for you. Do not share this medicine with others. What if I miss a dose? Keep appointments for follow-up doses. It is important not to miss your dose. Call your care team if you are unable to keep an appointment. What may interact with this medication? This medication may affect how other medications work, and other medications may affect how this medication works. Talk with your care team about all of the medications you take. They may suggest changes to your treatment plan to lower the risk of side effects and to make sure your medications work as intended. This list may not describe all possible interactions. Give your health care provider a list of all the medicines, herbs, non-prescription drugs, or dietary supplements you use. Also tell them if you smoke, drink alcohol, or use illegal drugs. Some items may interact with your medicine. What should I watch for while using this medication? Your condition will be monitored carefully while you are receiving this medication. This medication may make you feel generally unwell. This is not uncommon as chemotherapy can affect healthy cells as well as cancer cells. Report any side effects. Continue your course of treatment even though you feel ill unless your care team tells you to stop. This medication may increase blood sugar. The  risk may be higher in patients who already have diabetes. Ask your care team what you can do to lower your risk of diabetes while taking this medication. This medication can cause a serious condition in which there is too much acid in your blood. If you develop nausea, vomiting, stomach pain, unusual tiredness, or trouble breathing, stop taking this  medication and call your care team right away. If possible, use a ketone dipstick to check for ketones in your urine. This medication may cause dry eyes and blurred vision. If you wear contact lenses, you may feel some discomfort. Lubricating eye drops may help. See your care team if the problem does not go away or is severe. Tell your care team right away if you have any change in your eyesight. This medication may increase your risk of getting an infection. Call your care team for advice if you get a fever, chills, sore throat, or other symptoms of a cold or flu. Do not treat yourself. Try to avoid being around people who are sick. Avoid taking medications that contain aspirin, acetaminophen , ibuprofen , naproxen, or ketoprofen unless instructed by your care team. These medications may hide a fever. This medication may cause serious skin reactions. They can happen weeks to months after starting the medication. Contact your care team right away if you notice fevers or flu-like symptoms with a rash. The rash may be red or purple and then turn into blisters or peeling of the skin. You may also notice a red rash with swelling of the face, lips or lymph nodes in your neck or under your arms. Talk to your care team if you or your partner may be pregnant. Serious birth defects can occur if you take this medication during pregnancy and for 2 months after the last dose. You will need a negative pregnancy test before starting this medication. Contraception is recommended while taking this medication and for 2 months after the last dose. Your care team can help you find the option that works for you. If your partner can get pregnant, use a condom during sex while taking this medication and for 4 months after the last dose. Do not breastfeed while taking this medicine or for at least 3 weeks after the last dose. This medication may cause infertility. Talk to your care team if you are concerned about your  fertility. What side effects may I notice from receiving this medication? Side effects that you should report to your care team as soon as possible: Allergic reactions--skin rash, itching, hives, swelling of the face, lips, tongue, or throat Dry cough, shortness of breath or trouble breathing Eye pain, redness, irritation, or discharge with blurry or decreased vision High blood sugar (hyperglycemia)--increased thirst or amount of urine, unusual weakness or fatigue, blurry vision Painful swelling, warmth, or redness of the skin, blisters or sores at the infusion site Pain, tingling, or numbness in the hands or feet Redness, blistering, peeling, or loosening of the skin, including inside the mouth Unusual bruising or bleeding Side effects that usually do not require medical attention (report these to your care team if they continue or are bothersome): Change in taste Diarrhea Dry eyes Fatigue Hair loss Loss of appetite This list may not describe all possible side effects. Call your doctor for medical advice about side effects. You may report side effects to FDA at 1-800-FDA-1088. Where should I keep my medication? This medication is given in a hospital or clinic. It will not be stored at home. NOTE: This  sheet is a summary. It may not cover all possible information. If you have questions about this medicine, talk to your doctor, pharmacist, or health care provider.  2024 Elsevier/Gold Standard (2021-10-14 00:00:00)  Pembrolizumab  Injection What is this medication? PEMBROLIZUMAB  (PEM broe LIZ ue mab) treats some types of cancer. It works by helping your immune system slow or stop the spread of cancer cells. It is a monoclonal antibody. This medicine may be used for other purposes; ask your health care provider or pharmacist if you have questions. COMMON BRAND NAME(S): Keytruda  What should I tell my care team before I take this medication? They need to know if you have any of these  conditions: Allogeneic stem cell transplant (uses someone else's stem cells) Autoimmune diseases, such as Crohn disease, ulcerative colitis, lupus History of chest radiation Nervous system problems, such as Guillain-Barre syndrome, myasthenia gravis Organ transplant An unusual or allergic reaction to pembrolizumab , other medications, foods, dyes, or preservatives Pregnant or trying to get pregnant Breast-feeding How should I use this medication? This medication is injected into a vein. It is given by your care team in a hospital or clinic setting. A special MedGuide will be given to you before each treatment. Be sure to read this information carefully each time. Talk to your care team about the use of this medication in children. While it may be prescribed for children as young as 6 months for selected conditions, precautions do apply. Overdosage: If you think you have taken too much of this medicine contact a poison control center or emergency room at once. NOTE: This medicine is only for you. Do not share this medicine with others. What if I miss a dose? Keep appointments for follow-up doses. It is important not to miss your dose. Call your care team if you are unable to keep an appointment. What may interact with this medication? Interactions have not been studied. This list may not describe all possible interactions. Give your health care provider a list of all the medicines, herbs, non-prescription drugs, or dietary supplements you use. Also tell them if you smoke, drink alcohol, or use illegal drugs. Some items may interact with your medicine. What should I watch for while using this medication? Your condition will be monitored carefully while you are receiving this medication. You may need blood work while taking this medication. This medication may cause serious skin reactions. They can happen weeks to months after starting the medication. Contact your care team right away if you notice  fevers or flu-like symptoms with a rash. The rash may be red or purple and then turn into blisters or peeling of the skin. You may also notice a red rash with swelling of the face, lips, or lymph nodes in your neck or under your arms. Tell your care team right away if you have any change in your eyesight. Talk to your care team if you may be pregnant. Serious birth defects can occur if you take this medication during pregnancy and for 4 months after the last dose. You will need a negative pregnancy test before starting this medication. Contraception is recommended while taking this medication and for 4 months after the last dose. Your care team can help you find the option that works for you. Do not breastfeed while taking this medication and for 4 months after the last dose. What side effects may I notice from receiving this medication? Side effects that you should report to your care team as soon as possible: Allergic reactions--skin  rash, itching, hives, swelling of the face, lips, tongue, or throat Dry cough, shortness of breath or trouble breathing Eye pain, redness, irritation, or discharge with blurry or decreased vision Heart muscle inflammation--unusual weakness or fatigue, shortness of breath, chest pain, fast or irregular heartbeat, dizziness, swelling of the ankles, feet, or hands Hormone gland problems--headache, sensitivity to light, unusual weakness or fatigue, dizziness, fast or irregular heartbeat, increased sensitivity to cold or heat, excessive sweating, constipation, hair loss, increased thirst or amount of urine, tremors or shaking, irritability Infusion reactions--chest pain, shortness of breath or trouble breathing, feeling faint or lightheaded Kidney injury (glomerulonephritis)--decrease in the amount of urine, red or dark brown urine, foamy or bubbly urine, swelling of the ankles, hands, or feet Liver injury--right upper belly pain, loss of appetite, nausea, light-colored stool,  dark yellow or brown urine, yellowing skin or eyes, unusual weakness or fatigue Pain, tingling, or numbness in the hands or feet, muscle weakness, change in vision, confusion or trouble speaking, loss of balance or coordination, trouble walking, seizures Rash, fever, and swollen lymph nodes Redness, blistering, peeling, or loosening of the skin, including inside the mouth Sudden or severe stomach pain, bloody diarrhea, fever, nausea, vomiting Side effects that usually do not require medical attention (report to your care team if they continue or are bothersome): Bone, joint, or muscle pain Diarrhea Fatigue Loss of appetite Nausea Skin rash This list may not describe all possible side effects. Call your doctor for medical advice about side effects. You may report side effects to FDA at 1-800-FDA-1088. Where should I keep my medication? This medication is given in a hospital or clinic. It will not be stored at home. NOTE: This sheet is a summary. It may not cover all possible information. If you have questions about this medicine, talk to your doctor, pharmacist, or health care provider.  2024 Elsevier/Gold Standard (2021-10-14 00:00:00)      To help prevent nausea and vomiting after your treatment, we encourage you to take your nausea medication as directed.  BELOW ARE SYMPTOMS THAT SHOULD BE REPORTED IMMEDIATELY: *FEVER GREATER THAN 100.4 F (38 C) OR HIGHER *CHILLS OR SWEATING *NAUSEA AND VOMITING THAT IS NOT CONTROLLED WITH YOUR NAUSEA MEDICATION *UNUSUAL SHORTNESS OF BREATH *UNUSUAL BRUISING OR BLEEDING *URINARY PROBLEMS (pain or burning when urinating, or frequent urination) *BOWEL PROBLEMS (unusual diarrhea, constipation, pain near the anus) TENDERNESS IN MOUTH AND THROAT WITH OR WITHOUT PRESENCE OF ULCERS (sore throat, sores in mouth, or a toothache) UNUSUAL RASH, SWELLING OR PAIN  UNUSUAL VAGINAL DISCHARGE OR ITCHING   Items with * indicate a potential emergency and should be  followed up as soon as possible or go to the Emergency Department if any problems should occur.  Please show the CHEMOTHERAPY ALERT CARD or IMMUNOTHERAPY ALERT CARD at check-in to the Emergency Department and triage nurse.  Should you have questions after your visit or need to cancel or reschedule your appointment, please contact [facility] - A DEPT OF [facility]  Dept: [phone]  and follow the prompts.  Office hours are 8:00 a.m. to 4:30 p.m. Monday - Friday. Please note that voicemails left after 4:00 p.m. may not be returned until the following business day.  We are closed weekends and major holidays. You have access to a nurse at all times for urgent questions. Please call the main number to the clinic Dept: [phone] and follow the prompts.   For any non-urgent questions, you may also contact your provider using  MyChart. We now offer e-Visits for anyone 30 and older to request care online for non-urgent symptoms. For details visit [email or web address].   Also download the MyChart app! Go to the app store, search MyChart, open the app, select , and log in with your MyChart username and password.

## 2024-04-06 NOTE — Progress Notes (Signed)
 [facility] OFFICE PROGRESS NOTE   Diagnosis: Urothelial carcinoma  INTERVAL HISTORY:   Ms. Tabares completed day 1 cycle 2 chemotherapy on 03/30/2024.  She reports improvement in pain.  She has urinary frequency and discomfort.  She started ciprofloxacin yesterday and notes partial improvement.  She has a mild rash over the trunk.  She has developed alopecia.  Objective:  Vital signs in last 24 hours:  Blood pressure 111/75, pulse 93, temperature 97.9 F (36.6 C), temperature source Temporal, resp. rate 18, height 5' 3 (1.6 m), weight 115 lb 12.8 oz (52.5 kg), SpO2 96%.    HEENT: No thrush or ulcers Resp: Clear bilaterally Cardio: Regular rate and rhythm GI: No hepatosplenomegaly, nontender Vascular: No leg edema  Skin: Mild erythematous dry rash over lower abdomen and back  Portacath/PICC-without erythema  Lab Results:  Lab Results  Component Value Date   WBC 10.4 04/06/2024   HGB 10.8 (L) 04/06/2024   HCT 33.7 (L) 04/06/2024   MCV 90.6 04/06/2024   PLT 306 04/06/2024   NEUTROABS 7.3 04/06/2024    CMP  Lab Results  Component Value Date   NA 131 (L) 04/06/2024   K 3.3 (L) 04/06/2024   CL 97 (L) 04/06/2024   CO2 24 04/06/2024   GLUCOSE 133 (H) 04/06/2024   BUN 12 04/06/2024   CREATININE 0.76 04/06/2024   CALCIUM 9.2 04/06/2024   PROT 6.1 (L) 04/06/2024   ALBUMIN 3.2 (L) 04/06/2024   AST 14 (L) 04/06/2024   ALT 5 04/06/2024   ALKPHOS 97 04/06/2024   BILITOT 0.3 04/06/2024   GFRNONAA >60 04/06/2024   GFRAA  03/15/2008    >60        The eGFR has been calculated using the MDRD equation. This calculation has not been validated in all clinical    Medications: I have reviewed the patient's current medications.   Assessment/Plan:  Right renal mass CTs abdomen/pelvis 02/01/2024-mild right-sided hydroureteronephrosis extending to the mid to distal ureter; multiple enlarged retroperitoneal lymph nodes involving the periaortic and aortocaval  spaces; ovoid hyperdensity along the lower pole of the right kidney measuring 1.8 cm.   CT renal 02/08/2024-1.6 x 1.7 x 2.0 cm hyperattenuating moderately enhancing mass in the right kidney lower pole calyx with subtle extension of tumor into the anteroinferior wall of the right renal pelvis; moderate right hydronephrosis and hydroureter up to the level of the right mid ureter; additional linear bandlike enhancing area in the right upper ureter; extensive heterogeneous retroperitoneal lymphadenopathy.   PET scan 02/11/2024-hypermetabolic left supraclavicular, AP window, retrocrural, right gastric and retroperitoneal adenopathy; solitary hypermetabolic skeletal lesion in the vicinity of the right anterior S1 sacral foramen. Biopsy left supraclavicular lymph node 02/24/2024-metastatic carcinoma compatible with clinical suspicion of metastatic urothelial carcinoma Cystoscopy/right ureter stent placement 03/01/2024-No intravesical or urethral abnormality seen; solitary right collecting system with no filling defects or dilation involving the right ureter or right renal pelvis; papillary tumor involving the right mid pole calyx with features concerning for urothelial carcinoma. Cycle 1 day 1 pembrolizumab  and enfortumab 03/09/2024; day 8 held 03/16/2024 due to rash and diarrhea CT chest 03/13/2024-negative for PE; progressive adenopathy in the chest and upper abdomen CT lumbar spine 03/13/2024-known S1 metastasis only faintly visualized.  Loss of cortex at the S1 foramen with possible soft tissue component, possibly abutting the subjacent nerve root.  Upper retroperitoneal adenopathy again seen, progressive. 03/16/2024 cycle 1 day 8 enfortumab held due to diarrhea and rash 03/29/2024 cycle 2-day 1 Pembrolizumab /enfortumab, enfortumab dose reduced due  to rash and diarrhea following cycle 1 day 1, day 8 enfortumab 04/06/2024 Back pain secondary to #1 Change in bowel habits, postprandial abdominal pain Emphysema Tobacco  use Rash and diarrhea following cycle 1 day 1 Pembrolizumab /enfortumab   Disposition: Ann Lee tolerated day 1 cycle 2 chemotherapy well.  She has a mild rash over the trunk.  She will complete day 8 enfortumab today.  She is completing a course of ciprofloxacin for dysuria.  We will check a urinalysis today.  She will return for an office visit and chemotherapy in 2 weeks.  She will be referred for restaging CTs after cycle 3.  Arley Hof, MD  04/06/2024  2:01 PM

## 2024-04-06 NOTE — Progress Notes (Signed)
 Patient seen by Dr. Arley Hof today  Vitals are within treatment parameters:Yes   Labs are within treatment parameters: Yes  No tx needed for K+ 3.3  Treatment plan has been signed: Yes   Per physician team, Patient is ready for treatment and there are NO modifications to the treatment plan.

## 2024-04-07 ENCOUNTER — Other Ambulatory Visit: Payer: Self-pay | Admitting: *Deleted

## 2024-04-07 ENCOUNTER — Other Ambulatory Visit: Payer: Self-pay

## 2024-04-07 DIAGNOSIS — Z87891 Personal history of nicotine dependence: Secondary | ICD-10-CM

## 2024-04-07 DIAGNOSIS — Z122 Encounter for screening for malignant neoplasm of respiratory organs: Secondary | ICD-10-CM

## 2024-04-07 DIAGNOSIS — F17210 Nicotine dependence, cigarettes, uncomplicated: Secondary | ICD-10-CM

## 2024-04-12 ENCOUNTER — Encounter: Payer: Self-pay | Admitting: Nurse Practitioner

## 2024-04-12 ENCOUNTER — Telehealth: Payer: Self-pay | Admitting: Oncology

## 2024-04-12 ENCOUNTER — Inpatient Hospital Stay: Admitting: Nurse Practitioner

## 2024-04-12 ENCOUNTER — Inpatient Hospital Stay

## 2024-04-12 ENCOUNTER — Telehealth: Payer: Self-pay | Admitting: *Deleted

## 2024-04-12 VITALS — BP 105/80 | HR 77 | Temp 97.9°F | Resp 18 | Ht 63.0 in | Wt 112.7 lb

## 2024-04-12 DIAGNOSIS — Z79620 Long term (current) use of immunosuppressive biologic: Secondary | ICD-10-CM | POA: Diagnosis not present

## 2024-04-12 DIAGNOSIS — Z5112 Encounter for antineoplastic immunotherapy: Secondary | ICD-10-CM | POA: Diagnosis not present

## 2024-04-12 DIAGNOSIS — C641 Malignant neoplasm of right kidney, except renal pelvis: Secondary | ICD-10-CM

## 2024-04-12 DIAGNOSIS — C651 Malignant neoplasm of right renal pelvis: Secondary | ICD-10-CM | POA: Diagnosis not present

## 2024-04-12 DIAGNOSIS — R300 Dysuria: Secondary | ICD-10-CM

## 2024-04-12 DIAGNOSIS — C689 Malignant neoplasm of urinary organ, unspecified: Secondary | ICD-10-CM | POA: Diagnosis not present

## 2024-04-12 DIAGNOSIS — C7951 Secondary malignant neoplasm of bone: Secondary | ICD-10-CM | POA: Diagnosis not present

## 2024-04-12 DIAGNOSIS — C770 Secondary and unspecified malignant neoplasm of lymph nodes of head, face and neck: Secondary | ICD-10-CM | POA: Diagnosis not present

## 2024-04-12 LAB — CMP (CANCER CENTER ONLY)
ALT: 6 U/L (ref 0–44)
AST: 19 U/L (ref 15–41)
Albumin: 3.4 g/dL — ABNORMAL LOW (ref 3.5–5.0)
Alkaline Phosphatase: 83 U/L (ref 38–126)
Anion gap: 17 — ABNORMAL HIGH (ref 5–15)
BUN: 10 mg/dL (ref 8–23)
CO2: 21 mmol/L — ABNORMAL LOW (ref 22–32)
Calcium: 9.2 mg/dL (ref 8.9–10.3)
Chloride: 95 mmol/L — ABNORMAL LOW (ref 98–111)
Creatinine: 0.69 mg/dL (ref 0.44–1.00)
GFR, Estimated: >60 mL/min (ref >60)
Glucose, Bld: 86 mg/dL (ref 70–99)
Potassium: 3.6 mmol/L (ref 3.5–5.1)
Sodium: 133 mmol/L — ABNORMAL LOW (ref 135–145)
Total Bilirubin: 0.3 mg/dL (ref 0.0–1.2)
Total Protein: 6.2 g/dL — ABNORMAL LOW (ref 6.5–8.1)

## 2024-04-12 LAB — CBC WITH DIFFERENTIAL (CANCER CENTER ONLY)
Abs Immature Granulocytes: 0.04 K/uL (ref 0.00–0.07)
Basophils Absolute: 0.1 K/uL (ref 0.0–0.1)
Basophils Relative: 1 %
Eosinophils Absolute: 1.2 K/uL — ABNORMAL HIGH (ref 0.0–0.5)
Eosinophils Relative: 13 %
HCT: 33 % — ABNORMAL LOW (ref 36.0–46.0)
Hemoglobin: 10.7 g/dL — ABNORMAL LOW (ref 12.0–15.0)
Immature Granulocytes: 0 %
Lymphocytes Relative: 8 %
Lymphs Abs: 0.8 K/uL (ref 0.7–4.0)
MCH: 29.1 pg (ref 26.0–34.0)
MCHC: 32.4 g/dL (ref 30.0–36.0)
MCV: 89.7 fL (ref 80.0–100.0)
Monocytes Absolute: 1.1 K/uL — ABNORMAL HIGH (ref 0.1–1.0)
Monocytes Relative: 11 %
Neutro Abs: 6.7 K/uL (ref 1.7–7.7)
Neutrophils Relative %: 67 %
Platelet Count: 347 K/uL (ref 150–400)
RBC: 3.68 MIL/uL — ABNORMAL LOW (ref 3.87–5.11)
RDW: 14.6 % (ref 11.5–15.5)
WBC Count: 9.8 K/uL (ref 4.0–10.5)
nRBC: 0 % (ref 0.0–0.2)

## 2024-04-12 LAB — URINALYSIS, COMPLETE (UACMP) WITH MICROSCOPIC
Bacteria, UA: NONE SEEN
Glucose, UA: NEGATIVE mg/dL
Ketones, ur: >80 mg/dL — AB
Nitrite: NEGATIVE
Protein, ur: >300 mg/dL — AB
RBC / HPF: >50 RBC/hpf (ref 0–5)
Specific Gravity, Urine: 1.026 (ref 1.005–1.030)
WBC, UA: >50 WBC/hpf (ref 0–5)
pH: 6 (ref 5.0–8.0)

## 2024-04-12 MED ORDER — MIRTAZAPINE 7.5 MG PO TABS: 7.5000 mg | ORAL_TABLET | Freq: Every day | ORAL | 1 refills | Status: DC

## 2024-04-12 MED ORDER — PANTOPRAZOLE SODIUM 20 MG PO TBEC: 20.0000 mg | DELAYED_RELEASE_TABLET | Freq: Every day | ORAL | 1 refills | Status: DC

## 2024-04-12 NOTE — Progress Notes (Signed)
 [facility]   [phone] [phone]    Patient Care Team: Micheal Wolm ORN, MD as PCP - General O'Kelley, Sari SQUIBB, RN as Oncology Nurse Navigator   CHIEF COMPLAINT: Symptom management visit for dysuria, n/v, and epigastric pain   CURRENT THERAPY: Padcev Nadene for urothelial carcinoma   INTERVAL HISTORY Ms. Coste presents for symptom management visit. Last seen 10/23 with cycle 1 day 8 padcev . She felt well for a day or so but in the past few days she is not eating/drinking much due to nausea, low appetite, and poor taste. Alternating zofran  and compazine  which is partially effective. She burps a lot, and occasionally feels a marble in the upper abdomen which is tender and seems to swell with burping. Ran out of pyridium  a week ago and has had more burning/discomfort since then, urine is brownish. Bowels moving.   ROS  All other systems reviewed and negative   Past Medical History:  Diagnosis Date   Endometriosis    HERPES ZOSTER 10/18/2008   History of iron deficiency anemia    Hypertension    Bp'a have been running low, holding medication at this time   Migraines    NECK PAIN 02/15/2009   PARESTHESIA 10/30/2009   PERS HX TOBACCO USE PRESENTING HAZARDS HEALTH 03/10/2010   SINUSITIS, ACUTE 05/03/2009   SORE THROAT 02/12/2009   Squamous cell skin cancer    right forearm   TMJ SYNDROME 02/11/2009   Wears dentures      Past Surgical History:  Procedure Laterality Date   CYSTOSCOPY WITH BIOPSY Right 03/01/2024   Procedure: CYSTOSCOPY,;  Surgeon: Devere Lonni Righter, MD;  Location: [facility];  Service: Urology;  Laterality: Right;  CYSTOSCOPY WITH RIGHT URETEROSCOPY, POSSIBLE BIOPSY, RIGHT RETROGRADE PYELOGRAM, STENT PLACEMENT   CYSTOSCOPY WITH URETEROSCOPY AND STENT PLACEMENT Right 03/01/2024   Procedure: CYSTOURETEROSCOPY, WITH STENT INSERTION;  Surgeon: Devere Lonni Righter, MD;  Location: [facility];  Service: Urology;   Laterality: Right;   DILATION AND CURETTAGE OF UTERUS     HERNIA REPAIR     umbilical   IR IMAGING GUIDED PORT INSERTION  03/06/2024   WISDOM TOOTH EXTRACTION       Outpatient Encounter Medications as of 04/12/2024  Medication Sig Note   ciprofloxacin (CIPRO) 500 MG tablet Take 1 tablet (500 mg total) by mouth 2 (two) times daily.    lidocaine -prilocaine  (EMLA ) cream Apply 1 Application topically as needed (Apply to port 1 hour prior to its use, starting with 2nd treatment).    LORazepam  (ATIVAN ) 0.5 MG tablet Take 1 tablet (0.5 mg total) by mouth every 6 (six) hours as needed for anxiety.    mirtazapine (REMERON) 7.5 MG tablet Take 1 tablet (7.5 mg total) by mouth at bedtime.    pantoprazole (PROTONIX) 20 MG tablet Take 1 tablet (20 mg total) by mouth daily.    prochlorperazine  (COMPAZINE ) 5 MG tablet Take 1 tablet (5 mg total) by mouth every 6 (six) hours as needed for nausea or vomiting.    acetaminophen  (TYLENOL ) 500 MG tablet Take 500 mg by mouth in the morning, at noon, in the evening, and at bedtime. (Patient not taking: Reported on 04/12/2024)    amLODipine  (NORVASC ) 5 MG tablet Take 1 tablet (5 mg total) by mouth daily. (Patient not taking: Reported on 04/12/2024) 02/28/2024: On hold   HYDROmorphone  (DILAUDID ) 2 MG tablet Take 0.5-1 tablets (1-2 mg total) by mouth every 6 (six) hours as needed for severe pain (pain  score 7-10). (Patient not taking: Reported on 04/12/2024)    ibuprofen  (ADVIL ) 200 MG tablet Take 200 mg by mouth 4 (four) times daily. (Patient not taking: Reported on 04/12/2024)    lidocaine  (LIDODERM ) 5 % Place 1 patch onto the skin daily. Remove & Discard patch within 12 hours or as directed by MD (Patient not taking: Reported on 04/12/2024)    nystatin  cream (MYCOSTATIN ) Apply 1 Application topically 2 (two) times daily. (Patient not taking: Reported on 04/12/2024)    ondansetron  (ZOFRAN ) 8 MG tablet Take 1 tablet (8 mg total) by mouth every 8 (eight) hours as needed for  nausea or vomiting. (Patient not taking: Reported on 04/12/2024)    oxybutynin  (DITROPAN ) 5 MG tablet Take 1 tablet (5 mg total) by mouth every 8 (eight) hours as needed for bladder spasms. (Patient not taking: Reported on 04/12/2024)    phenazopyridine  (PYRIDIUM ) 200 MG tablet Take 1 tablet (200 mg total) by mouth 3 (three) times daily as needed (for pain with urination). (Patient not taking: Reported on 04/12/2024)    SUMAtriptan  (IMITREX ) 25 MG tablet TAKE ONE AT ONSET OF MIGRAINE AND MAY REPEAT ONE IN TWO HOURS AS NEEDED (MAX OF 2 IN 24 HOURS) (Patient not taking: Reported on 04/12/2024)    Facility-Administered Encounter Medications as of 04/12/2024  Medication   0.9 %  sodium chloride  infusion   prochlorperazine  (COMPAZINE ) tablet 10 mg     Today's Vitals   04/12/24 1207 04/12/24 1225  BP: 105/80   Pulse: 77   Resp: 18   Temp: 97.9 F (36.6 C)   TempSrc: Temporal   SpO2: 100%   Weight: 112 lb 11.2 oz (51.1 kg)   Height: 5' 3 (1.6 m)   PainSc:  0-No pain   Body mass index is 19.96 kg/m.    PHYSICAL EXAM GENERAL:alert, no distress and comfortable EYES: sclera clear LUNGS: clear with normal breathing effort HEART: regular rate & rhythm, no lower extremity edema ABDOMEN: abdomen soft, non-tender and normal bowel sounds. Xyphoid process palpable in the upper abdomen. No CVAT  NEURO: alert & oriented x 3 with fluent speech, no focal motor/sensory deficits PAC without erythema    CBC    Latest Ref Rng & Units 04/12/2024   12:12 PM 04/06/2024   11:35 AM 03/29/2024   11:06 AM  CBC  WBC 4.0 - 10.5 K/uL 9.8  10.4  9.5   Hemoglobin 12.0 - 15.0 g/dL 89.2  89.1  88.7   Hematocrit 36.0 - 46.0 % 33.0  33.7  35.2   Platelets 150 - 400 K/uL 347  306  330       CMP     Latest Ref Rng & Units 04/12/2024   12:12 PM 04/06/2024   11:35 AM 03/29/2024   11:06 AM  CMP  Glucose 70 - 99 mg/dL 86  866  875   BUN 8 - 23 mg/dL 10  12  10    Creatinine 0.44 - 1.00 mg/dL 9.30  9.23   9.34   Sodium 135 - 145 mmol/L 133  131  136   Potassium 3.5 - 5.1 mmol/L 3.6  3.3  3.6   Chloride 98 - 111 mmol/L 95  97  103   CO2 22 - 32 mmol/L 21  24  24    Calcium 8.9 - 10.3 mg/dL 9.2  9.2  8.8   Total Protein 6.5 - 8.1 g/dL 6.2  6.1  5.9   Total Bilirubin 0.0 - 1.2 mg/dL 0.3  0.3  <  0.2   Alkaline Phos 38 - 126 U/L 83  97  129   AST 15 - 41 U/L 19  14  12    ALT 0 - 44 U/L 6  5  6        ASSESSMENT & PLAN: Right renal mass CTs abdomen/pelvis 02/01/2024-mild right-sided hydroureteronephrosis extending to the mid to distal ureter; multiple enlarged retroperitoneal lymph nodes involving the periaortic and aortocaval spaces; ovoid hyperdensity along the lower pole of the right kidney measuring 1.8 cm.   CT renal 02/08/2024-1.6 x 1.7 x 2.0 cm hyperattenuating moderately enhancing mass in the right kidney lower pole calyx with subtle extension of tumor into the anteroinferior wall of the right renal pelvis; moderate right hydronephrosis and hydroureter up to the level of the right mid ureter; additional linear bandlike enhancing area in the right upper ureter; extensive heterogeneous retroperitoneal lymphadenopathy.   PET scan 02/11/2024-hypermetabolic left supraclavicular, AP window, retrocrural, right gastric and retroperitoneal adenopathy; solitary hypermetabolic skeletal lesion in the vicinity of the right anterior S1 sacral foramen. Biopsy left supraclavicular lymph node 02/24/2024-metastatic carcinoma compatible with clinical suspicion of metastatic urothelial carcinoma Cystoscopy/right ureter stent placement 03/01/2024-No intravesical or urethral abnormality seen; solitary right collecting system with no filling defects or dilation involving the right ureter or right renal pelvis; papillary tumor involving the right mid pole calyx with features concerning for urothelial carcinoma. Cycle 1 day 1 pembrolizumab  and enfortumab 03/09/2024; day 8 held 03/16/2024 due to rash and diarrhea CT chest  03/13/2024-negative for PE; progressive adenopathy in the chest and upper abdomen CT lumbar spine 03/13/2024-known S1 metastasis only faintly visualized.  Loss of cortex at the S1 foramen with possible soft tissue component, possibly abutting the subjacent nerve root.  Upper retroperitoneal adenopathy again seen, progressive. 03/16/2024 cycle 1 day 8 enfortumab held due to diarrhea and rash 03/29/2024 cycle 2-day 1 Pembrolizumab /enfortumab, enfortumab dose reduced due to rash and diarrhea following cycle 1 day 1, day 8 enfortumab 04/06/2024 Back pain secondary to #1 Change in bowel habits, postprandial abdominal pain Emphysema Tobacco use Rash and diarrhea following cycle 1 day 1 Pembrolizumab /enfortumab N/V, GERD, low appetite     Disposition:  Ms. Eddleman appears stable, currently C1 D15 padcev /pembro. She presents with n/v, GERD, and low appetite secondary to chemo. We reviewed symptom management. Continue zofran /compazine , will add protonix for GERD and mirtazapine for appetite. Reviewed med side effects, she agrees to proceed.   The palpable tenderness in the epigastric area appears to be at the xyphoid process, more prominent due to weight loss. I do not appreciate a subQ nodule or anything concerning for cancer. She can use tylenol  if needed, declined anything stronger for pain.  UA is not overtly suggestive for persistent/recurrent UTI and labs are reassuring. Scr is normal. We will f/up today's culture. She declined IVF. I recommend to increase hydration and she will restart pyridium . F/up with Dr. Devere in December, or sooner if needed.   F/up with next treatment cycle 11/6, or sooner if needed. She knows to call if symptoms worsen or fail to improve. Plan reviewed with Dr. Cloretta.       All questions were answered. No barriers to learning were detected. I spent 20 minutes counseling the patient face to face. The total time spent in the appointment was 30 minutes and more than 50%  was on counseling, review of test results, and coordination of care.   Jaretssi Kraker K Kesler Wickham, NP 04/12/2024

## 2024-04-12 NOTE — Telephone Encounter (Addendum)
 Ann Lee called to report her urinary symptoms have not improved. Still having urinary frequency with small amounts and burning at end of stream. Urine looks reddish-brown in color. No fever. Not consistently hydrating properly due to nausea. She has one Cipro left (picked up med on 10/22) and after nurse questioned her, she thinks she could have missed a day of the antibiotic. Also reports a tender epigastric area and feels as if there is a lump there that comes and goes and makes eating difficult. Asking if she could be seen? Per Dr. Cloretta: Will have her in to see NP this am w/labs. Patient is aware. Scheduler to call her with a time.

## 2024-04-12 NOTE — Telephone Encounter (Signed)
 Update on PT appt, confirming time and day.

## 2024-04-13 ENCOUNTER — Ambulatory Visit: Payer: Self-pay | Admitting: Nurse Practitioner

## 2024-04-13 LAB — URINE CULTURE: Culture: NO GROWTH

## 2024-04-13 NOTE — Telephone Encounter (Signed)
 Patient gave verbal understanding and had no further questions.

## 2024-04-13 NOTE — Telephone Encounter (Signed)
-----   Message from Mayme MARLA Silversmith sent at 04/13/2024  1:37 PM EDT ----- Please let pt know urine culture from yesterday is negative, no UTI.   Thanks Lacie NP ----- Message ----- From: Rebecka, Lab [facility] [place] Sent: 04/12/2024  12:01 PM EDT To: Lacie K Burton, NP

## 2024-04-20 ENCOUNTER — Telehealth: Payer: Self-pay | Admitting: *Deleted

## 2024-04-20 ENCOUNTER — Inpatient Hospital Stay

## 2024-04-20 ENCOUNTER — Other Ambulatory Visit: Payer: Self-pay | Admitting: *Deleted

## 2024-04-20 ENCOUNTER — Inpatient Hospital Stay: Attending: Oncology

## 2024-04-20 ENCOUNTER — Inpatient Hospital Stay: Admitting: Oncology

## 2024-04-20 ENCOUNTER — Other Ambulatory Visit: Payer: Self-pay | Admitting: Oncology

## 2024-04-20 VITALS — BP 92/67 | HR 99 | Temp 97.8°F | Resp 18 | Ht 63.0 in | Wt 110.9 lb

## 2024-04-20 VITALS — BP 112/71 | HR 77 | Temp 98.1°F | Resp 18

## 2024-04-20 DIAGNOSIS — Z79620 Long term (current) use of immunosuppressive biologic: Secondary | ICD-10-CM | POA: Insufficient documentation

## 2024-04-20 DIAGNOSIS — C679 Malignant neoplasm of bladder, unspecified: Secondary | ICD-10-CM | POA: Diagnosis not present

## 2024-04-20 DIAGNOSIS — C7951 Secondary malignant neoplasm of bone: Secondary | ICD-10-CM | POA: Diagnosis not present

## 2024-04-20 DIAGNOSIS — C641 Malignant neoplasm of right kidney, except renal pelvis: Secondary | ICD-10-CM | POA: Insufficient documentation

## 2024-04-20 DIAGNOSIS — Z5112 Encounter for antineoplastic immunotherapy: Secondary | ICD-10-CM | POA: Diagnosis not present

## 2024-04-20 DIAGNOSIS — C770 Secondary and unspecified malignant neoplasm of lymph nodes of head, face and neck: Secondary | ICD-10-CM | POA: Insufficient documentation

## 2024-04-20 DIAGNOSIS — Z452 Encounter for adjustment and management of vascular access device: Secondary | ICD-10-CM | POA: Insufficient documentation

## 2024-04-20 LAB — CMP (CANCER CENTER ONLY)
ALT: 6 U/L (ref 0–44)
AST: 13 U/L — ABNORMAL LOW (ref 15–41)
Albumin: 3.2 g/dL — ABNORMAL LOW (ref 3.5–5.0)
Alkaline Phosphatase: 91 U/L (ref 38–126)
Anion gap: 9 (ref 5–15)
BUN: 8 mg/dL (ref 8–23)
CO2: 28 mmol/L (ref 22–32)
Calcium: 8.8 mg/dL — ABNORMAL LOW (ref 8.9–10.3)
Chloride: 98 mmol/L (ref 98–111)
Creatinine: 0.66 mg/dL (ref 0.44–1.00)
GFR, Estimated: >60 mL/min (ref >60)
Glucose, Bld: 109 mg/dL — ABNORMAL HIGH (ref 70–99)
Potassium: 2.9 mmol/L — ABNORMAL LOW (ref 3.5–5.1)
Sodium: 135 mmol/L (ref 135–145)
Total Bilirubin: 0.4 mg/dL (ref 0.0–1.2)
Total Protein: 5.8 g/dL — ABNORMAL LOW (ref 6.5–8.1)

## 2024-04-20 LAB — CBC WITH DIFFERENTIAL (CANCER CENTER ONLY)
Abs Immature Granulocytes: 0.04 K/uL (ref 0.00–0.07)
Basophils Absolute: 0.1 K/uL (ref 0.0–0.1)
Basophils Relative: 1 %
Eosinophils Absolute: 0.3 K/uL (ref 0.0–0.5)
Eosinophils Relative: 4 %
HCT: 33.4 % — ABNORMAL LOW (ref 36.0–46.0)
Hemoglobin: 10.8 g/dL — ABNORMAL LOW (ref 12.0–15.0)
Immature Granulocytes: 1 %
Lymphocytes Relative: 16 %
Lymphs Abs: 1 K/uL (ref 0.7–4.0)
MCH: 28.6 pg (ref 26.0–34.0)
MCHC: 32.3 g/dL (ref 30.0–36.0)
MCV: 88.6 fL (ref 80.0–100.0)
Monocytes Absolute: 1.1 K/uL — ABNORMAL HIGH (ref 0.1–1.0)
Monocytes Relative: 18 %
Neutro Abs: 3.8 K/uL (ref 1.7–7.7)
Neutrophils Relative %: 60 %
Platelet Count: 346 K/uL (ref 150–400)
RBC: 3.77 MIL/uL — ABNORMAL LOW (ref 3.87–5.11)
RDW: 15.1 % (ref 11.5–15.5)
WBC Count: 6.3 K/uL (ref 4.0–10.5)
nRBC: 0 % (ref 0.0–0.2)

## 2024-04-20 LAB — TSH: TSH: 1.75 u[IU]/mL (ref 0.350–4.500)

## 2024-04-20 MED ORDER — PREDNISONE 50 MG PO TABS: ORAL_TABLET | ORAL | 0 refills | Status: DC

## 2024-04-20 MED ORDER — SODIUM CHLORIDE 0.9 % IV SOLN
1.0000 mg/kg | Freq: Once | INTRAVENOUS | Status: AC
  Administered 2024-04-20: 50 mg via INTRAVENOUS
  Filled 2024-04-20: qty 5

## 2024-04-20 MED ORDER — SODIUM CHLORIDE 0.9 % IV SOLN: INTRAVENOUS | Status: DC

## 2024-04-20 MED ORDER — POTASSIUM CHLORIDE CRYS ER 10 MEQ PO TBCR: 20.0000 meq | EXTENDED_RELEASE_TABLET | Freq: Two times a day (BID) | ORAL | 0 refills | Status: DC

## 2024-04-20 MED ORDER — LORAZEPAM 0.5 MG PO TABS: 0.5000 mg | ORAL_TABLET | Freq: Four times a day (QID) | ORAL | 0 refills | Status: DC | PRN

## 2024-04-20 MED ORDER — PROCHLORPERAZINE MALEATE 10 MG PO TABS: 10.0000 mg | ORAL_TABLET | Freq: Once | ORAL | Status: DC

## 2024-04-20 MED ORDER — ONDANSETRON HCL 8 MG PO TABS: 8.0000 mg | ORAL_TABLET | Freq: Three times a day (TID) | ORAL | 2 refills | Status: DC | PRN

## 2024-04-20 MED ORDER — SODIUM CHLORIDE 0.9 % IV SOLN
200.0000 mg | Freq: Once | INTRAVENOUS | Status: AC
  Administered 2024-04-20: 200 mg via INTRAVENOUS
  Filled 2024-04-20: qty 8

## 2024-04-20 NOTE — Patient Instructions (Signed)
 [facility] - A DEPT OF [place]  Discharge Instructions: Thank you for choosing [facility] to provide your oncology and hematology care.   If you have a lab appointment with the Cancer Center, please go directly to the Cancer Center and check in at the registration area.   Wear comfortable clothing and clothing appropriate for easy access to any Portacath or PICC line.   We strive to give you quality time with your provider. You may need to reschedule your appointment if you arrive late (15 or more minutes).  Arriving late affects you and other patients whose appointments are after yours.  Also, if you miss three or more appointments without notifying the office, you may be dismissed from the clinic at the provider's discretion.      For prescription refill requests, have your pharmacy contact our office and allow 72 hours for refills to be completed.    Today you received the following chemotherapy and/or immunotherapy agents: padcev  & Keytruda .   Enfortumab Vedotin  Injection What is this medication? ENFORTUMAB VEDOTIN  (en FORT ue mab ve DOE tin) treats bladder cancer and kidney cancer. It works by blocking a protein that causes cancer cells to grow and multiply. This helps to slow or stop the spread of cancer cells. This medicine may be used for other purposes; ask your health care provider or pharmacist if you have questions. COMMON BRAND NAME(S): PADCEV  What should I tell my care team before I take this medication? They need to know if you have any of these conditions: Diabetes Eye disease Liver disease Lung disease Tingling of the fingers or toes or other nerve disorder Vision problems An unusual or allergic reaction to enfortumab vedotin , other medications, foods, dyes, or preservatives Pregnant or trying to get pregnant Breast-feeding How should I use this medication? This medication is injected into a vein. It is given by your  care team in a hospital or clinic setting. Talk to your care team about the use of this medication in children. Special care may be needed. Overdosage: If you think you have taken too much of this medicine contact a poison control center or emergency room at once. NOTE: This medicine is only for you. Do not share this medicine with others. What if I miss a dose? Keep appointments for follow-up doses. It is important not to miss your dose. Call your care team if you are unable to keep an appointment. What may interact with this medication? This medication may affect how other medications work, and other medications may affect how this medication works. Talk with your care team about all of the medications you take. They may suggest changes to your treatment plan to lower the risk of side effects and to make sure your medications work as intended. This list may not describe all possible interactions. Give your health care provider a list of all the medicines, herbs, non-prescription drugs, or dietary supplements you use. Also tell them if you smoke, drink alcohol, or use illegal drugs. Some items may interact with your medicine. What should I watch for while using this medication? Your condition will be monitored carefully while you are receiving this medication. This medication may make you feel generally unwell. This is not uncommon as chemotherapy can affect healthy cells as well as cancer cells. Report any side effects. Continue your course of treatment even though you feel ill unless your care team tells you to stop. This medication may increase blood  sugar. The risk may be higher in patients who already have diabetes. Ask your care team what you can do to lower your risk of diabetes while taking this medication. This medication can cause a serious condition in which there is too much acid in your blood. If you develop nausea, vomiting, stomach pain, unusual tiredness, or trouble breathing, stop  taking this medication and call your care team right away. If possible, use a ketone dipstick to check for ketones in your urine. This medication may cause dry eyes and blurred vision. If you wear contact lenses, you may feel some discomfort. Lubricating eye drops may help. See your care team if the problem does not go away or is severe. Tell your care team right away if you have any change in your eyesight. This medication may increase your risk of getting an infection. Call your care team for advice if you get a fever, chills, sore throat, or other symptoms of a cold or flu. Do not treat yourself. Try to avoid being around people who are sick. Avoid taking medications that contain aspirin, acetaminophen , ibuprofen , naproxen, or ketoprofen unless instructed by your care team. These medications may hide a fever. This medication may cause serious skin reactions. They can happen weeks to months after starting the medication. Contact your care team right away if you notice fevers or flu-like symptoms with a rash. The rash may be red or purple and then turn into blisters or peeling of the skin. You may also notice a red rash with swelling of the face, lips or lymph nodes in your neck or under your arms. Talk to your care team if you or your partner may be pregnant. Serious birth defects can occur if you take this medication during pregnancy and for 2 months after the last dose. You will need a negative pregnancy test before starting this medication. Contraception is recommended while taking this medication and for 2 months after the last dose. Your care team can help you find the option that works for you. If your partner can get pregnant, use a condom during sex while taking this medication and for 4 months after the last dose. Do not breastfeed while taking this medicine or for at least 3 weeks after the last dose. This medication may cause infertility. Talk to your care team if you are concerned about your  fertility. What side effects may I notice from receiving this medication? Side effects that you should report to your care team as soon as possible: Allergic reactions--skin rash, itching, hives, swelling of the face, lips, tongue, or throat Dry cough, shortness of breath or trouble breathing Eye pain, redness, irritation, or discharge with blurry or decreased vision High blood sugar (hyperglycemia)--increased thirst or amount of urine, unusual weakness or fatigue, blurry vision Painful swelling, warmth, or redness of the skin, blisters or sores at the infusion site Pain, tingling, or numbness in the hands or feet Redness, blistering, peeling, or loosening of the skin, including inside the mouth Unusual bruising or bleeding Side effects that usually do not require medical attention (report these to your care team if they continue or are bothersome): Change in taste Diarrhea Dry eyes Fatigue Hair loss Loss of appetite This list may not describe all possible side effects. Call your doctor for medical advice about side effects. You may report side effects to FDA at 1-800-FDA-1088. Where should I keep my medication? This medication is given in a hospital or clinic. It will not be stored at home.  NOTE: This sheet is a summary. It may not cover all possible information. If you have questions about this medicine, talk to your doctor, pharmacist, or health care provider.  2024 Elsevier/Gold Standard (2021-10-14 00:00:00)  Pembrolizumab  Injection What is this medication? PEMBROLIZUMAB  (PEM broe LIZ ue mab) treats some types of cancer. It works by helping your immune system slow or stop the spread of cancer cells. It is a monoclonal antibody. This medicine may be used for other purposes; ask your health care provider or pharmacist if you have questions. COMMON BRAND NAME(S): Keytruda  What should I tell my care team before I take this medication? They need to know if you have any of these  conditions: Allogeneic stem cell transplant (uses someone else's stem cells) Autoimmune diseases, such as Crohn disease, ulcerative colitis, lupus History of chest radiation Nervous system problems, such as Guillain-Barre syndrome, myasthenia gravis Organ transplant An unusual or allergic reaction to pembrolizumab , other medications, foods, dyes, or preservatives Pregnant or trying to get pregnant Breast-feeding How should I use this medication? This medication is injected into a vein. It is given by your care team in a hospital or clinic setting. A special MedGuide will be given to you before each treatment. Be sure to read this information carefully each time. Talk to your care team about the use of this medication in children. While it may be prescribed for children as young as 6 months for selected conditions, precautions do apply. Overdosage: If you think you have taken too much of this medicine contact a poison control center or emergency room at once. NOTE: This medicine is only for you. Do not share this medicine with others. What if I miss a dose? Keep appointments for follow-up doses. It is important not to miss your dose. Call your care team if you are unable to keep an appointment. What may interact with this medication? Interactions have not been studied. This list may not describe all possible interactions. Give your health care provider a list of all the medicines, herbs, non-prescription drugs, or dietary supplements you use. Also tell them if you smoke, drink alcohol, or use illegal drugs. Some items may interact with your medicine. What should I watch for while using this medication? Your condition will be monitored carefully while you are receiving this medication. You may need blood work while taking this medication. This medication may cause serious skin reactions. They can happen weeks to months after starting the medication. Contact your care team right away if you notice  fevers or flu-like symptoms with a rash. The rash may be red or purple and then turn into blisters or peeling of the skin. You may also notice a red rash with swelling of the face, lips, or lymph nodes in your neck or under your arms. Tell your care team right away if you have any change in your eyesight. Talk to your care team if you may be pregnant. Serious birth defects can occur if you take this medication during pregnancy and for 4 months after the last dose. You will need a negative pregnancy test before starting this medication. Contraception is recommended while taking this medication and for 4 months after the last dose. Your care team can help you find the option that works for you. Do not breastfeed while taking this medication and for 4 months after the last dose. What side effects may I notice from receiving this medication? Side effects that you should report to your care team as soon as possible:  Allergic reactions--skin rash, itching, hives, swelling of the face, lips, tongue, or throat Dry cough, shortness of breath or trouble breathing Eye pain, redness, irritation, or discharge with blurry or decreased vision Heart muscle inflammation--unusual weakness or fatigue, shortness of breath, chest pain, fast or irregular heartbeat, dizziness, swelling of the ankles, feet, or hands Hormone gland problems--headache, sensitivity to light, unusual weakness or fatigue, dizziness, fast or irregular heartbeat, increased sensitivity to cold or heat, excessive sweating, constipation, hair loss, increased thirst or amount of urine, tremors or shaking, irritability Infusion reactions--chest pain, shortness of breath or trouble breathing, feeling faint or lightheaded Kidney injury (glomerulonephritis)--decrease in the amount of urine, red or dark brown urine, foamy or bubbly urine, swelling of the ankles, hands, or feet Liver injury--right upper belly pain, loss of appetite, nausea, light-colored stool,  dark yellow or brown urine, yellowing skin or eyes, unusual weakness or fatigue Pain, tingling, or numbness in the hands or feet, muscle weakness, change in vision, confusion or trouble speaking, loss of balance or coordination, trouble walking, seizures Rash, fever, and swollen lymph nodes Redness, blistering, peeling, or loosening of the skin, including inside the mouth Sudden or severe stomach pain, bloody diarrhea, fever, nausea, vomiting Side effects that usually do not require medical attention (report to your care team if they continue or are bothersome): Bone, joint, or muscle pain Diarrhea Fatigue Loss of appetite Nausea Skin rash This list may not describe all possible side effects. Call your doctor for medical advice about side effects. You may report side effects to FDA at 1-800-FDA-1088. Where should I keep my medication? This medication is given in a hospital or clinic. It will not be stored at home. NOTE: This sheet is a summary. It may not cover all possible information. If you have questions about this medicine, talk to your doctor, pharmacist, or health care provider.  2024 Elsevier/Gold Standard (2021-10-14 00:00:00)      To help prevent nausea and vomiting after your treatment, we encourage you to take your nausea medication as directed.  BELOW ARE SYMPTOMS THAT SHOULD BE REPORTED IMMEDIATELY: *FEVER GREATER THAN 100.4 F (38 C) OR HIGHER *CHILLS OR SWEATING *NAUSEA AND VOMITING THAT IS NOT CONTROLLED WITH YOUR NAUSEA MEDICATION *UNUSUAL SHORTNESS OF BREATH *UNUSUAL BRUISING OR BLEEDING *URINARY PROBLEMS (pain or burning when urinating, or frequent urination) *BOWEL PROBLEMS (unusual diarrhea, constipation, pain near the anus) TENDERNESS IN MOUTH AND THROAT WITH OR WITHOUT PRESENCE OF ULCERS (sore throat, sores in mouth, or a toothache) UNUSUAL RASH, SWELLING OR PAIN  UNUSUAL VAGINAL DISCHARGE OR ITCHING   Items with * indicate a potential emergency and should be  followed up as soon as possible or go to the Emergency Department if any problems should occur.  Please show the CHEMOTHERAPY ALERT CARD or IMMUNOTHERAPY ALERT CARD at check-in to the Emergency Department and triage nurse.  Should you have questions after your visit or need to cancel or reschedule your appointment, please contact [facility] - A DEPT OF [facility]  Dept: [phone]  and follow the prompts.  Office hours are 8:00 a.m. to 4:30 p.m. Monday - Friday. Please note that voicemails left after 4:00 p.m. may not be returned until the following business day.  We are closed weekends and major holidays. You have access to a nurse at all times for urgent questions. Please call the main number to the clinic Dept: [phone] and follow the prompts.   For any non-urgent questions, you may also contact your  provider using MyChart. We now offer e-Visits for anyone 5 and older to request care online for non-urgent symptoms. For details visit [email or web address].   Also download the MyChart app! Go to the app store, search MyChart, open the app, select [place], and log in with your MyChart username and password.

## 2024-04-20 NOTE — Progress Notes (Signed)
 Patient seen by Dr. Arley Hof today  Vitals are within treatment parameters:Yes   Labs are within treatment parameters: No (Please specify and give further instructions.) K+ 2.9--OK to treat. Will start oral KCl today. No IV K+ needed  Treatment plan has been signed: Yes   Per physician team, Patient is ready for treatment and there are NO modifications to the treatment plan.

## 2024-04-20 NOTE — Patient Instructions (Signed)

## 2024-04-20 NOTE — Telephone Encounter (Signed)
 Left VM with CT at [facility] on 11/20 at 1 pm with 12:45 pm arrival. Also will need port access at [facility] at 12:00 that day. Also sent via MyChart message.Remember to take your Prednisone  and Benadryl as ordered. Script was sent to pharmacy.

## 2024-04-20 NOTE — Progress Notes (Signed)
 [facility] OFFICE PROGRESS NOTE   Diagnosis: Urothelial carcinoma  INTERVAL HISTORY:   Ms. [name] completed another cycle of pembrolizumab /enfortumab beginning 03/30/2024.  She completed day 8 enfortumab on 04/06/2024.  No nausea/vomiting, rash, or diarrhea.  She reports anorexia and malaise.  No pain.  Objective:  Vital signs in last 24 hours:  Blood pressure 92/67, pulse 99, temperature 97.8 F (36.6 C), temperature source Temporal, resp. rate 18, height 5' 3 (1.6 m), weight 110 lb 14.4 oz (50.3 kg), SpO2 100%.    HEENT: No thrush or ulcers Resp: Lungs with distant breath sounds and end expiratory wheeze at the left upper posterior chest Cardio: Regular rate and rhythm GI: Nontender, no hepatomegaly Vascular: No leg edema  Skin: Dryness over the back  Portacath/PICC-without erythema  Lab Results:  Lab Results  Component Value Date   WBC 6.3 04/20/2024   HGB 10.8 (L) 04/20/2024   HCT 33.4 (L) 04/20/2024   MCV 88.6 04/20/2024   PLT 346 04/20/2024   NEUTROABS 3.8 04/20/2024    CMP  Lab Results  Component Value Date   NA 135 04/20/2024   K 2.9 (L) 04/20/2024   CL 98 04/20/2024   CO2 28 04/20/2024   GLUCOSE 109 (H) 04/20/2024   BUN 8 04/20/2024   CREATININE 0.66 04/20/2024   CALCIUM 8.8 (L) 04/20/2024   PROT 5.8 (L) 04/20/2024   ALBUMIN 3.2 (L) 04/20/2024   AST 13 (L) 04/20/2024   ALT 6 04/20/2024   ALKPHOS 91 04/20/2024   BILITOT 0.4 04/20/2024   GFRNONAA >60 04/20/2024   GFRAA  03/15/2008    >60        The eGFR has been calculated using the MDRD equation. This calculation has not been validated in all clinical    Medications: I have reviewed the patient's current medications.   Assessment/Plan: Right renal mass CTs abdomen/pelvis 02/01/2024-mild right-sided hydroureteronephrosis extending to the mid to distal ureter; multiple enlarged retroperitoneal lymph nodes involving the periaortic and aortocaval spaces; ovoid hyperdensity  along the lower pole of the right kidney measuring 1.8 cm.   CT renal 02/08/2024-1.6 x 1.7 x 2.0 cm hyperattenuating moderately enhancing mass in the right kidney lower pole calyx with subtle extension of tumor into the anteroinferior wall of the right renal pelvis; moderate right hydronephrosis and hydroureter up to the level of the right mid ureter; additional linear bandlike enhancing area in the right upper ureter; extensive heterogeneous retroperitoneal lymphadenopathy.   PET scan 02/11/2024-hypermetabolic left supraclavicular, AP window, retrocrural, right gastric and retroperitoneal adenopathy; solitary hypermetabolic skeletal lesion in the vicinity of the right anterior S1 sacral foramen. Biopsy left supraclavicular lymph node 02/24/2024-metastatic carcinoma compatible with clinical suspicion of metastatic urothelial carcinoma Cystoscopy/right ureter stent placement 03/01/2024-No intravesical or urethral abnormality seen; solitary right collecting system with no filling defects or dilation involving the right ureter or right renal pelvis; papillary tumor involving the right mid pole calyx with features concerning for urothelial carcinoma. Cycle 1 day 1 pembrolizumab  and enfortumab 03/09/2024; day 8 held 03/16/2024 due to rash and diarrhea CT chest 03/13/2024-negative for PE; progressive adenopathy in the chest and upper abdomen CT lumbar spine 03/13/2024-known S1 metastasis only faintly visualized.  Loss of cortex at the S1 foramen with possible soft tissue component, possibly abutting the subjacent nerve root.  Upper retroperitoneal adenopathy again seen, progressive. 03/16/2024 cycle 1 day 8 enfortumab held due to diarrhea and rash 03/29/2024 cycle 2-day 1 Pembrolizumab /enfortumab, enfortumab dose reduced due to rash and diarrhea following cycle 1 day  1, day 8 enfortumab 04/06/2024 04/20/2024 cycle 3-day 1 pembrolizumab /enfortumab Back pain secondary to #1 Change in bowel habits, postprandial abdominal  pain Emphysema Tobacco use Rash and diarrhea following cycle 1 day 1 Pembrolizumab /enfortumab N/V, GERD, low appetite     Disposition: Ann Lee has completed 2 cycles of pembrolizumab /enfortumab.  Pain has improved.  She will complete cycle 3 beginning today.  She undergo restaging CTs after cycle 3.  She will return for an office visit and day 8 chemotherapy 04/27/2024.  Arley Hof, MD  04/20/2024  9:11 AM

## 2024-04-21 ENCOUNTER — Other Ambulatory Visit: Payer: Self-pay

## 2024-04-21 ENCOUNTER — Other Ambulatory Visit (HOSPITAL_BASED_OUTPATIENT_CLINIC_OR_DEPARTMENT_OTHER): Payer: Self-pay

## 2024-04-21 ENCOUNTER — Telehealth: Payer: Self-pay

## 2024-04-21 ENCOUNTER — Other Ambulatory Visit: Payer: Self-pay | Admitting: Nurse Practitioner

## 2024-04-21 DIAGNOSIS — C641 Malignant neoplasm of right kidney, except renal pelvis: Secondary | ICD-10-CM

## 2024-04-21 LAB — T4: T4, Total: 7 ug/dL (ref 4.5–12.0)

## 2024-04-21 MED ORDER — POTASSIUM CHLORIDE CRYS ER 10 MEQ PO TBCR
EXTENDED_RELEASE_TABLET | ORAL | 0 refills | Status: DC
  Filled 2024-04-21: qty 126, 30d supply, fill #0

## 2024-04-21 MED ORDER — POTASSIUM CHLORIDE CRYS ER 10 MEQ PO TBCR: EXTENDED_RELEASE_TABLET | ORAL | 0 refills | Status: DC

## 2024-04-21 NOTE — Telephone Encounter (Signed)
 I spoke with the patient regarding her appointment scheduled for November 26th. She requested to cancel that appointment and instead proceed with the appointment scheduled for December 3rd.

## 2024-04-21 NOTE — Telephone Encounter (Signed)
 The patient contacted us  to inform that [facility] will not fill a 90-day supply of her K+ medication. I have resent a 30-day supply to [facility].

## 2024-04-24 ENCOUNTER — Telehealth: Payer: Self-pay | Admitting: *Deleted

## 2024-04-24 ENCOUNTER — Other Ambulatory Visit: Payer: Self-pay | Admitting: *Deleted

## 2024-04-24 ENCOUNTER — Inpatient Hospital Stay

## 2024-04-24 DIAGNOSIS — E860 Dehydration: Secondary | ICD-10-CM

## 2024-04-24 DIAGNOSIS — C641 Malignant neoplasm of right kidney, except renal pelvis: Secondary | ICD-10-CM

## 2024-04-24 MED FILL — Enfortumab Vedotin-ejfv For IV Soln 30 MG: INTRAVENOUS | Qty: 3 | Status: AC

## 2024-04-24 MED FILL — Enfortumab Vedotin-ejfv For IV Soln 20 MG: INTRAVENOUS | Qty: 2 | Status: AC

## 2024-04-24 NOTE — Telephone Encounter (Addendum)
 Patient called scheduler and asked to get IVF tomorrow due to persistent N/V and inability to keep anything down. Noted her last tx was 04/20/24. Left VM asking her to call back to inform nurse when N/V started and how she has been taking her antiemetics. How many times/day does she vomit. Patient called back: N/V started pm of 11/08 and nothing stays down--water comes right back up. Unsure if it is reflux or vomiting (clear liquid/mucous). Suggested the try ativan  sublingual until she comes in tomorrow. Will also check BMP since she has not been able to get the K+ down for several days

## 2024-04-25 ENCOUNTER — Inpatient Hospital Stay

## 2024-04-25 ENCOUNTER — Other Ambulatory Visit: Payer: Self-pay | Admitting: Nurse Practitioner

## 2024-04-25 DIAGNOSIS — E860 Dehydration: Secondary | ICD-10-CM

## 2024-04-25 DIAGNOSIS — C7951 Secondary malignant neoplasm of bone: Secondary | ICD-10-CM | POA: Diagnosis not present

## 2024-04-25 DIAGNOSIS — C641 Malignant neoplasm of right kidney, except renal pelvis: Secondary | ICD-10-CM

## 2024-04-25 DIAGNOSIS — C679 Malignant neoplasm of bladder, unspecified: Secondary | ICD-10-CM | POA: Diagnosis not present

## 2024-04-25 DIAGNOSIS — C770 Secondary and unspecified malignant neoplasm of lymph nodes of head, face and neck: Secondary | ICD-10-CM | POA: Diagnosis not present

## 2024-04-25 DIAGNOSIS — Z5112 Encounter for antineoplastic immunotherapy: Secondary | ICD-10-CM | POA: Diagnosis not present

## 2024-04-25 DIAGNOSIS — Z452 Encounter for adjustment and management of vascular access device: Secondary | ICD-10-CM | POA: Diagnosis not present

## 2024-04-25 DIAGNOSIS — Z79620 Long term (current) use of immunosuppressive biologic: Secondary | ICD-10-CM | POA: Diagnosis not present

## 2024-04-25 LAB — BASIC METABOLIC PANEL - CANCER CENTER ONLY
Anion gap: 20 — ABNORMAL HIGH (ref 5–15)
BUN: 11 mg/dL (ref 8–23)
CO2: 21 mmol/L — ABNORMAL LOW (ref 22–32)
Calcium: 9.2 mg/dL (ref 8.9–10.3)
Chloride: 99 mmol/L (ref 98–111)
Creatinine: 0.61 mg/dL (ref 0.44–1.00)
GFR, Estimated: >60 mL/min (ref >60)
Glucose, Bld: 74 mg/dL (ref 70–99)
Potassium: 3.4 mmol/L — ABNORMAL LOW (ref 3.5–5.1)
Sodium: 139 mmol/L (ref 135–145)

## 2024-04-25 MED ORDER — SODIUM CHLORIDE 0.9 % IV SOLN: INTRAVENOUS | Status: AC

## 2024-04-25 MED ORDER — ONDANSETRON HCL 4 MG/2ML IJ SOLN
8.0000 mg | Freq: Once | INTRAMUSCULAR | Status: AC
  Administered 2024-04-25: 8 mg via INTRAVENOUS
  Filled 2024-04-25: qty 4

## 2024-04-25 NOTE — Patient Instructions (Signed)
 Dehydration, Adult Dehydration is a condition in which there is not enough water or other fluids in the body. This happens when a person loses more fluids than they take in. Important organs cannot work right without the right amount of fluids. Any loss of fluids from the body can cause dehydration. Dehydration can be mild, worse, or very bad. It should be treated right away to keep it from getting very bad. What are the causes? Conditions that cause loss of water in the body. They include: Watery poop (diarrhea). Vomiting. Sweating a lot. Fever. Infection. Peeing (urinating) a lot. Not drinking enough fluids. Certain medicines, such as medicines that take extra fluid out of the body (diuretics). Lack of safe drinking water. Not being able to get enough water and food. What increases the risk? Having a long-term (chronic) illness that has not been treated the right way, such as: Diabetes. Heart disease. Kidney disease. Being 25 years of age or older. Having a disability. Living in a place that is high above the ground or sea (high in altitude). The thinner, drier air causes more fluid loss. Doing exercises that put stress on your body for a long time. Being active when in hot places. What are the signs or symptoms? Symptoms of dehydration depend on how bad it is. Mild or worse dehydration Thirst. Dry lips or dry mouth. Feeling dizzy or light-headed. Muscle cramps. Passing little pee or dark pee. Pee may be the color of tea. Headache. Very bad dehydration Changes in skin. Skin may: Be cold to the touch (clammy). Be blotchy or pale. Not go back to normal right after you pinch it and let it go. Little or no tears, pee, or sweat. Fast breathing. Low blood pressure. Weak pulse. Pulse that is more than 100 beats a minute when you are sitting still. Other changes, such as: Feeling very thirsty. Eyes that look hollow (sunken). Cold hands and feet. Being confused. Being very  tired (lethargic) or having trouble waking from sleep. Losing weight. Loss of consciousness. How is this treated? Treatment for this condition depends on how bad your dehydration is. Treatment should start right away. Do not wait until your condition gets very bad. Very bad dehydration is an emergency. You will need to go to a hospital. Mild or worse dehydration can be treated at home. You may be asked to: Drink more fluids. Drink an oral rehydration solution (ORS). This drink gives you the right amount of fluids, salts, and minerals (electrolytes). Very bad dehydration can be treated: With fluids through an IV tube. By correcting low levels of electrolytes in the body. By treating the problem that caused your dehydration. Follow these instructions at home: Oral rehydration solution If told by your doctor, drink an ORS: Make an ORS. Use instructions on the package. Start by drinking small amounts, about  cup (120 mL) every 5-10 minutes. Slowly drink more until you have had the amount that your doctor said to have.  Eating and drinking  Drink enough clear fluid to keep your pee pale yellow. If you were told to drink an ORS, finish the ORS first. Then, start slowly drinking other clear fluids. Drink fluids such as: Water. Do not drink only water. Doing that can make the salt (sodium) level in your body get too low. Water from ice chips you suck on. Fruit juice that you have added water to (diluted). Low-calorie sports drinks. Eat foods that have the right amounts of salts and minerals, such as bananas, oranges, potatoes,  tomatoes, or spinach. Do not drink alcohol. Avoid drinks that have caffeine or sugar. These include:: High-calorie sports drinks. Fruit juice that you did not add water to. Soda. Coffee or energy drinks. Avoid foods that are greasy or have a lot of fat or sugar. General instructions Take over-the-counter and prescription medicines only as told by your doctor. Do  not take sodium tablets. Doing that can make the salt level in your body get too high. Return to your normal activities as told by your doctor. Ask your doctor what activities are safe for you. Keep all follow-up visits. Your doctor may check and change your treatment. Contact a doctor if: You have pain in your belly (abdomen) and the pain: Gets worse. Stays in one place. You have a rash. You have a stiff neck. You get angry or annoyed more easily than normal. You are more tired or have a harder time waking than normal. You feel weak or dizzy. You feel very thirsty. Get help right away if: You have any symptoms of very bad dehydration. You vomit every time you eat or drink. Your vomiting gets worse, does not go away, or you vomit blood or green stuff. You are getting treatment, but symptoms are getting worse. You have a fever. You have a very bad headache. You have: Diarrhea that gets worse or does not go away. Blood in your poop (stool). This may cause poop to look black and tarry. No pee in 6-8 hours. Only a small amount of pee in 6-8 hours, and the pee is very dark. You have trouble breathing. These symptoms may be an emergency. Get help right away. Call 911. Do not wait to see if the symptoms will go away. Do not drive yourself to the hospital. This information is not intended to replace advice given to you by your health care provider. Make sure you discuss any questions you have with your health care provider. Document Revised: 12/29/2021 Document Reviewed: 12/29/2021 Elsevier Patient Education  2024 [employer].

## 2024-04-25 NOTE — Patient Instructions (Signed)

## 2024-04-26 ENCOUNTER — Other Ambulatory Visit: Payer: Self-pay

## 2024-04-27 ENCOUNTER — Encounter: Payer: Self-pay | Admitting: Nurse Practitioner

## 2024-04-27 ENCOUNTER — Inpatient Hospital Stay

## 2024-04-27 ENCOUNTER — Inpatient Hospital Stay: Admitting: Nurse Practitioner

## 2024-04-27 VITALS — BP 115/81 | HR 87 | Temp 98.0°F | Resp 18

## 2024-04-27 VITALS — BP 106/75 | HR 83 | Temp 97.9°F | Resp 18 | Ht 63.0 in | Wt 106.2 lb

## 2024-04-27 DIAGNOSIS — C641 Malignant neoplasm of right kidney, except renal pelvis: Secondary | ICD-10-CM | POA: Diagnosis not present

## 2024-04-27 DIAGNOSIS — C679 Malignant neoplasm of bladder, unspecified: Secondary | ICD-10-CM | POA: Diagnosis not present

## 2024-04-27 DIAGNOSIS — E860 Dehydration: Secondary | ICD-10-CM | POA: Diagnosis not present

## 2024-04-27 DIAGNOSIS — Z5112 Encounter for antineoplastic immunotherapy: Secondary | ICD-10-CM | POA: Diagnosis not present

## 2024-04-27 DIAGNOSIS — Z452 Encounter for adjustment and management of vascular access device: Secondary | ICD-10-CM | POA: Diagnosis not present

## 2024-04-27 DIAGNOSIS — Z79620 Long term (current) use of immunosuppressive biologic: Secondary | ICD-10-CM | POA: Diagnosis not present

## 2024-04-27 DIAGNOSIS — C770 Secondary and unspecified malignant neoplasm of lymph nodes of head, face and neck: Secondary | ICD-10-CM | POA: Diagnosis not present

## 2024-04-27 DIAGNOSIS — C7951 Secondary malignant neoplasm of bone: Secondary | ICD-10-CM | POA: Diagnosis not present

## 2024-04-27 LAB — CBC WITH DIFFERENTIAL (CANCER CENTER ONLY)
Abs Immature Granulocytes: 0.03 K/uL (ref 0.00–0.07)
Basophils Absolute: 0.2 K/uL — ABNORMAL HIGH (ref 0.0–0.1)
Basophils Relative: 2 %
Eosinophils Absolute: 0.6 K/uL — ABNORMAL HIGH (ref 0.0–0.5)
Eosinophils Relative: 8 %
HCT: 35 % — ABNORMAL LOW (ref 36.0–46.0)
Hemoglobin: 11.2 g/dL — ABNORMAL LOW (ref 12.0–15.0)
Immature Granulocytes: 0 %
Lymphocytes Relative: 15 %
Lymphs Abs: 1.2 K/uL (ref 0.7–4.0)
MCH: 28.4 pg (ref 26.0–34.0)
MCHC: 32 g/dL (ref 30.0–36.0)
MCV: 88.8 fL (ref 80.0–100.0)
Monocytes Absolute: 0.9 K/uL (ref 0.1–1.0)
Monocytes Relative: 12 %
Neutro Abs: 4.9 K/uL (ref 1.7–7.7)
Neutrophils Relative %: 63 %
Platelet Count: 335 K/uL (ref 150–400)
RBC: 3.94 MIL/uL (ref 3.87–5.11)
RDW: 15.1 % (ref 11.5–15.5)
WBC Count: 7.7 K/uL (ref 4.0–10.5)
nRBC: 0 % (ref 0.0–0.2)

## 2024-04-27 LAB — CMP (CANCER CENTER ONLY)
ALT: 6 U/L (ref 0–44)
AST: 14 U/L — ABNORMAL LOW (ref 15–41)
Albumin: 3.1 g/dL — ABNORMAL LOW (ref 3.5–5.0)
Alkaline Phosphatase: 93 U/L (ref 38–126)
Anion gap: 14 (ref 5–15)
BUN: 10 mg/dL (ref 8–23)
CO2: 25 mmol/L (ref 22–32)
Calcium: 9.4 mg/dL (ref 8.9–10.3)
Chloride: 101 mmol/L (ref 98–111)
Creatinine: 0.57 mg/dL (ref 0.44–1.00)
GFR, Estimated: >60 mL/min (ref >60)
Glucose, Bld: 90 mg/dL (ref 70–99)
Potassium: 3.2 mmol/L — ABNORMAL LOW (ref 3.5–5.1)
Sodium: 139 mmol/L (ref 135–145)
Total Bilirubin: 0.5 mg/dL (ref 0.0–1.2)
Total Protein: 6.1 g/dL — ABNORMAL LOW (ref 6.5–8.1)

## 2024-04-27 LAB — CORTISOL: Cortisol, Plasma: 9.8 ug/dL

## 2024-04-27 LAB — TSH: TSH: 2.95 u[IU]/mL (ref 0.350–4.500)

## 2024-04-27 MED ORDER — POTASSIUM CHLORIDE IN NACL 20-0.9 MEQ/L-% IV SOLN
Freq: Once | INTRAVENOUS | Status: AC
  Filled 2024-04-27: qty 1000

## 2024-04-27 NOTE — Patient Instructions (Signed)

## 2024-04-27 NOTE — Progress Notes (Signed)
 [facility] OFFICE PROGRESS NOTE   Diagnosis: Urothelial carcinoma  INTERVAL HISTORY:   Ann Lee returns as scheduled.  She completed cycle 3-day 1 Pembrolizumab /enfortumab 04/20/2024.  She reports increased nausea/vomiting/regurgitation.  She regurgitates sips of water within 60 seconds.  She has been able to tolerate some popsicles.  Oral intake in general is extremely poor.  Marked decline in energy.  No mouth sores.  She notes cracks at the corners of the lips.  No diarrhea.  She has intermittent loose stools.  The rash flared last night on her thighs and back.  She is no longer having back pain.  Objective:  Vital signs in last 24 hours:  Blood pressure 106/75, pulse 83, temperature 97.9 F (36.6 C), temperature source Temporal, resp. rate 18, height 5' 3 (1.6 m), weight 106 lb 3.2 oz (48.2 kg), SpO2 100%.    HEENT: Angular cheilitis.  No mouth ulcers. Resp: Lungs clear bilaterally. Cardio: Regular rate and rhythm. GI: No hepatosplenomegaly. Vascular: No leg edema. Neuro: Alert and oriented. Skin: Skin in general has a dry appearance.  Faint erythema and hyperpigmentation at the lower back. Port-A-Cath without erythema.  Lab Results:  Lab Results  Component Value Date   WBC 7.7 04/27/2024   HGB 11.2 (L) 04/27/2024   HCT 35.0 (L) 04/27/2024   MCV 88.8 04/27/2024   PLT 335 04/27/2024   NEUTROABS 4.9 04/27/2024    Imaging:  No results found.  Medications: I have reviewed the patient's current medications.  Assessment/Plan: Right renal mass CTs abdomen/pelvis 02/01/2024-mild right-sided hydroureteronephrosis extending to the mid to distal ureter; multiple enlarged retroperitoneal lymph nodes involving the periaortic and aortocaval spaces; ovoid hyperdensity along the lower pole of the right kidney measuring 1.8 cm.   CT renal 02/08/2024-1.6 x 1.7 x 2.0 cm hyperattenuating moderately enhancing mass in the right kidney lower pole calyx with subtle  extension of tumor into the anteroinferior wall of the right renal pelvis; moderate right hydronephrosis and hydroureter up to the level of the right mid ureter; additional linear bandlike enhancing area in the right upper ureter; extensive heterogeneous retroperitoneal lymphadenopathy.   PET scan 02/11/2024-hypermetabolic left supraclavicular, AP window, retrocrural, right gastric and retroperitoneal adenopathy; solitary hypermetabolic skeletal lesion in the vicinity of the right anterior S1 sacral foramen. Biopsy left supraclavicular lymph node 02/24/2024-metastatic carcinoma compatible with clinical suspicion of metastatic urothelial carcinoma Cystoscopy/right ureter stent placement 03/01/2024-No intravesical or urethral abnormality seen; solitary right collecting system with no filling defects or dilation involving the right ureter or right renal pelvis; papillary tumor involving the right mid pole calyx with features concerning for urothelial carcinoma. Cycle 1 day 1 pembrolizumab  and enfortumab 03/09/2024; day 8 held 03/16/2024 due to rash and diarrhea CT chest 03/13/2024-negative for PE; progressive adenopathy in the chest and upper abdomen CT lumbar spine 03/13/2024-known S1 metastasis only faintly visualized.  Loss of cortex at the S1 foramen with possible soft tissue component, possibly abutting the subjacent nerve root.  Upper retroperitoneal adenopathy again seen, progressive. 03/16/2024 cycle 1 day 8 enfortumab held due to diarrhea and rash 03/29/2024 cycle 2-day 1 Pembrolizumab /enfortumab, enfortumab dose reduced due to rash and diarrhea following cycle 1 day 1, day 8 enfortumab 04/06/2024 04/20/2024 cycle 3-day 1 pembrolizumab /enfortumab; day 8 held due to failure to thrive Back pain secondary to #1 Change in bowel habits, postprandial abdominal pain Emphysema Tobacco use Rash and diarrhea following cycle 1 day 1 Pembrolizumab /enfortumab N/V, GERD, low appetite  Disposition: Ann Lee  appears unchanged.  She completed cycle 3-day  1 Pembrolizumab /enfortumab on 04/20/2024.  Since that treatment she has had progressive failure to thrive.  She has frequent nausea/regurgitation versus vomiting.  Etiology unclear.  She has lost more weight.  We are holding today's treatment.  Check cortisol and TSH.   Plan for IV fluids and IV potassium today.  Restaging scans are scheduled 05/04/2024.   We will see her back in 1 week.  She understands to contact the office prior to her next visit with further problems.  Plan reviewed with Dr. Cloretta.    Olam Ned ANP/GNP-BC   04/27/2024  9:48 AM

## 2024-04-27 NOTE — Progress Notes (Signed)
 Patient seen by Olam Ned NP today  Vitals are within treatment parameters:Yes   Labs are within treatment parameters: Yes   Treatment plan has been signed: Yes   Per physician team, Patient will not be receiving treatment today. She will be receive 1L IVF and 20 mEq K+

## 2024-04-28 ENCOUNTER — Telehealth: Payer: Self-pay | Admitting: *Deleted

## 2024-04-28 NOTE — Telephone Encounter (Addendum)
 Left message asking for someone to call her with interpretation of her lab work from 11/13. Olam, NP reviewed labs and this RN called patient that cortisol and TSH are normal. She was concerned about the elevated absolute eosinophils and basophils--explained that they are barely elevated and not uncommon for one going through chemotherapy. Hgb is also stable for one having chemo as well. Labs reveal no concerns. Plan now is to get the CT scan done on 11/20 to see if her disease status is improved, stable or progressed. She wants a telephone call w/results on 11/21 and a office visit to review the scan and discuss potential tx on 12/03.

## 2024-04-28 NOTE — Telephone Encounter (Signed)
 Ms. Klingel made aware that Dr. Cloretta said we will not have the report on 11/21 and he is out. Can see her w/NP on 05/10/24. Scheduler aware.

## 2024-04-30 ENCOUNTER — Other Ambulatory Visit: Payer: Self-pay

## 2024-05-02 ENCOUNTER — Encounter: Payer: Self-pay | Admitting: Family Medicine

## 2024-05-02 ENCOUNTER — Other Ambulatory Visit: Payer: Self-pay

## 2024-05-02 ENCOUNTER — Ambulatory Visit: Admitting: Family Medicine

## 2024-05-02 VITALS — BP 80/60 | HR 58 | Temp 97.7°F | Wt 105.2 lb

## 2024-05-02 DIAGNOSIS — G43909 Migraine, unspecified, not intractable, without status migrainosus: Secondary | ICD-10-CM

## 2024-05-02 DIAGNOSIS — I10 Essential (primary) hypertension: Secondary | ICD-10-CM | POA: Diagnosis not present

## 2024-05-02 DIAGNOSIS — R634 Abnormal weight loss: Secondary | ICD-10-CM | POA: Diagnosis not present

## 2024-05-02 DIAGNOSIS — C641 Malignant neoplasm of right kidney, except renal pelvis: Secondary | ICD-10-CM

## 2024-05-02 MED ORDER — SUMATRIPTAN SUCCINATE 25 MG PO TABS: ORAL_TABLET | ORAL | 3 refills | Status: DC

## 2024-05-02 NOTE — Progress Notes (Signed)
 Established Patient Office Visit  Subjective   Patient ID: Ann Lee, female    DOB: July 17, 1955  Age: 68 y.o. MRN: [id number]  Chief Complaint  Patient presents with   Weight Loss   Medical Management of Chronic Issues    HPI   [name] has urothelial carcinoma of the right kidney with metastases and is seen today for medical follow-up.  She has had about 37 pounds of weight loss.  She has been battling with some nausea and vomiting though improved some over the past few days.  Has been able to keep down some food for the past 2 to 3 days.  She has scheduled follow-up scan later this week and follow-up with oncologist next week.  Still has very poor appetite.  She is very concerned about her ability to go through her next cycle of treatment with her recent significant weight loss.  Does have history of hypertension and discontinued her amlodipine  and view of her recent weight loss.  She denies any orthostatic dizziness today the blood pressure is quite low.  Denies any recent fevers or chills.  She has history of migraine headaches and requesting refills of Imitrex  which she infrequently uses.  No recent headache.  Past Medical History:  Diagnosis Date   Endometriosis    HERPES ZOSTER 10/18/2008   History of iron deficiency anemia    Hypertension    Bp'a have been running low, holding medication at this time   Migraines    NECK PAIN 02/15/2009   PARESTHESIA 10/30/2009   PERS HX TOBACCO USE PRESENTING HAZARDS HEALTH 03/10/2010   SINUSITIS, ACUTE 05/03/2009   SORE THROAT 02/12/2009   Squamous cell skin cancer    right forearm   TMJ SYNDROME 02/11/2009   Wears dentures    Past Surgical History:  Procedure Laterality Date   CYSTOSCOPY WITH BIOPSY Right 03/01/2024   Procedure: CYSTOSCOPY,;  Surgeon: Devere Lonni Righter, MD;  Location: [facility];  Service: Urology;  Laterality: Right;  CYSTOSCOPY WITH RIGHT URETEROSCOPY, POSSIBLE BIOPSY, RIGHT RETROGRADE PYELOGRAM, STENT  PLACEMENT   CYSTOSCOPY WITH URETEROSCOPY AND STENT PLACEMENT Right 03/01/2024   Procedure: CYSTOURETEROSCOPY, WITH STENT INSERTION;  Surgeon: Devere Lonni Righter, MD;  Location: [facility];  Service: Urology;  Laterality: Right;   DILATION AND CURETTAGE OF UTERUS     HERNIA REPAIR     umbilical   IR IMAGING GUIDED PORT INSERTION  03/06/2024   WISDOM TOOTH EXTRACTION      reports that she has been smoking cigarettes. She has a 41 pack-year smoking history. She has never used smokeless tobacco. She reports that she does not drink alcohol and does not use drugs. family history includes Arthritis in her father and mother; Colon cancer in her father; Esophageal cancer in her mother; Hyperlipidemia in her father; Lung cancer in her sister; Non-Hodgkin's lymphoma in her mother; Stroke in her mother. Allergies  Allergen Reactions   Iodine Hives and Swelling   Shellfish Allergy Hives and Swelling    Review of Systems  Constitutional:  Positive for malaise/fatigue and weight loss. Negative for chills.  Respiratory:  Negative for shortness of breath.   Cardiovascular:  Negative for chest pain.  Gastrointestinal:  Positive for nausea. Negative for abdominal pain, blood in stool and vomiting.      Objective:     BP (!) 80/60   Pulse (!) 58   Temp 97.7 F (36.5 C) (Oral)   Wt 105 lb 3.2 oz (47.7 kg)   SpO2 97%  BMI 18.64 kg/m  BP Readings from Last 3 Encounters:  05/02/24 (!) 80/60  04/27/24 115/81  04/27/24 106/75   Wt Readings from Last 3 Encounters:  05/02/24 105 lb 3.2 oz (47.7 kg)  04/27/24 106 lb 3.2 oz (48.2 kg)  04/20/24 110 lb 14.4 oz (50.3 kg)      Physical Exam Vitals reviewed.  Cardiovascular:     Rate and Rhythm: Normal rate and regular rhythm.  Pulmonary:     Effort: Pulmonary effort is normal.     Breath sounds: Normal breath sounds.  Neurological:     Mental Status: She is alert.      No results found for any visits on 05/02/24.  Last CBC Lab Results   Component Value Date   WBC 7.7 04/27/2024   HGB 11.2 (L) 04/27/2024   HCT 35.0 (L) 04/27/2024   MCV 88.8 04/27/2024   MCH 28.4 04/27/2024   RDW 15.1 04/27/2024   PLT 335 04/27/2024   Last metabolic panel Lab Results  Component Value Date   GLUCOSE 90 04/27/2024   NA 139 04/27/2024   K 3.2 (L) 04/27/2024   CL 101 04/27/2024   CO2 25 04/27/2024   BUN 10 04/27/2024   CREATININE 0.57 04/27/2024   GFRNONAA >60 04/27/2024   CALCIUM 9.4 04/27/2024   PROT 6.1 (L) 04/27/2024   ALBUMIN 3.1 (L) 04/27/2024   BILITOT 0.5 04/27/2024   ALKPHOS 93 04/27/2024   AST 14 (L) 04/27/2024   ALT 6 04/27/2024   ANIONGAP 14 04/27/2024      The ASCVD Risk score (Arnett DK, et al., 2019) failed to calculate for the following reasons:   The valid systolic blood pressure range is 90 to 200 mmHg    Assessment & Plan:   #1 migraine headaches.  Patient denies any headaches currently.  Requesting refills of Imitrex  to have on hand to use as needed.  This was refilled  #2 metastatic urothelial cancer right kidney.  Patient has had couple treatments thus far but has had substantial amount of weight loss with some recurrent nausea and vomiting.  She has been in touch with hematology and they have tried Zofran  without much improvement.  They have explored other options for treating her nausea as well.  We had a long talk today about some ideas for trying to supplement her overall calories and nutrients.    #3 history of hypertension basically resolved with recent weight loss.  Continue to hold blood pressure medications.  She was hypotensive today but asymptomatic.  She is encouraged to increase fluid intake and hopefully liberalize sodium intake somewhat  Wolm Scarlet, MD

## 2024-05-04 ENCOUNTER — Ambulatory Visit: Payer: Self-pay | Admitting: Oncology

## 2024-05-04 ENCOUNTER — Inpatient Hospital Stay

## 2024-05-04 ENCOUNTER — Ambulatory Visit (HOSPITAL_BASED_OUTPATIENT_CLINIC_OR_DEPARTMENT_OTHER)
Admission: RE | Admit: 2024-05-04 | Discharge: 2024-05-04 | Disposition: A | Discharge status: Home / Self Care | Source: Ambulatory Visit | Attending: Oncology | Admitting: Oncology

## 2024-05-04 ENCOUNTER — Other Ambulatory Visit: Payer: Self-pay | Admitting: Nurse Practitioner

## 2024-05-04 DIAGNOSIS — Z5112 Encounter for antineoplastic immunotherapy: Secondary | ICD-10-CM | POA: Diagnosis not present

## 2024-05-04 DIAGNOSIS — C770 Secondary and unspecified malignant neoplasm of lymph nodes of head, face and neck: Secondary | ICD-10-CM | POA: Diagnosis not present

## 2024-05-04 DIAGNOSIS — C689 Malignant neoplasm of urinary organ, unspecified: Secondary | ICD-10-CM

## 2024-05-04 DIAGNOSIS — Z79620 Long term (current) use of immunosuppressive biologic: Secondary | ICD-10-CM | POA: Diagnosis not present

## 2024-05-04 DIAGNOSIS — C641 Malignant neoplasm of right kidney, except renal pelvis: Secondary | ICD-10-CM | POA: Insufficient documentation

## 2024-05-04 DIAGNOSIS — C7951 Secondary malignant neoplasm of bone: Secondary | ICD-10-CM | POA: Diagnosis not present

## 2024-05-04 DIAGNOSIS — C679 Malignant neoplasm of bladder, unspecified: Secondary | ICD-10-CM | POA: Diagnosis not present

## 2024-05-04 DIAGNOSIS — Z452 Encounter for adjustment and management of vascular access device: Secondary | ICD-10-CM | POA: Diagnosis not present

## 2024-05-04 DIAGNOSIS — R590 Localized enlarged lymph nodes: Secondary | ICD-10-CM | POA: Diagnosis not present

## 2024-05-04 MED ORDER — IOHEXOL 300 MG/ML  SOLN
100.0000 mL | Freq: Once | INTRAMUSCULAR | Status: AC | PRN
  Administered 2024-05-04: 100 mL via INTRAVENOUS

## 2024-05-04 NOTE — Patient Instructions (Signed)

## 2024-05-04 NOTE — Progress Notes (Signed)
 Pt here for a port access for CT scan. Accessed without difficulty with a power port needle kit. Advised her to return here to Infusion if radiology cannot deaccess her port.  She report feeling mych better than earlier this week. No more N/V, eating. Occasional dizziness but normal B/P.

## 2024-05-05 ENCOUNTER — Encounter: Payer: Self-pay | Admitting: Oncology

## 2024-05-05 NOTE — Telephone Encounter (Signed)
-----   Message from Arley Hof sent at 05/04/2024  8:37 PM EST ----- Please call patient, CTs suggest disease progression in lymph nodes and bones.  There was a month interval between baseline CT/PET and beginning of treatment.  Tumor may have progressed prior to the  start of treatment.  We will review scans and decide on continuing current treatment  ----- Message ----- From: Interface, Rad Results In Sent: 05/04/2024   5:12 PM EST To: Arley KATHEE Hof, MD

## 2024-05-05 NOTE — Telephone Encounter (Signed)
Patient had no other questions.

## 2024-05-07 ENCOUNTER — Other Ambulatory Visit: Payer: Self-pay | Admitting: Oncology

## 2024-05-09 NOTE — Progress Notes (Unsigned)
 [facility]   [phone] [phone]    Patient Care Team: Micheal Wolm ORN, MD as PCP - General O'Kelley, Sari SQUIBB, RN as Oncology Nurse Navigator   CHIEF COMPLAINT: Urothelial carcinoma   CURRENT THERAPY: Pembrolizumab /enfortumab q21 days  INTERVAL HISTORY Ann Lee returns for follow up. Last seen 04/27/24, treatment was held due to failure to thrive. She underwent restaging scans and has seen the report. She has felt better the past few days, able to keep food/liquids down, and energy has improved slightly. However, since starting chemo she feels worse overall. Having more back pain in the low back that spreads out. Tylenol /Ibuprofen  are partially effective, dilaudid  is too strong. Bowels moving. Denies fever/chills.   ROS  All other systems reviewed and negative   Past Medical History:  Diagnosis Date   Endometriosis    HERPES ZOSTER 10/18/2008   History of iron deficiency anemia    Hypertension    Bp'a have been running low, holding medication at this time   Migraines    NECK PAIN 02/15/2009   PARESTHESIA 10/30/2009   PERS HX TOBACCO USE PRESENTING HAZARDS HEALTH 03/10/2010   SINUSITIS, ACUTE 05/03/2009   SORE THROAT 02/12/2009   Squamous cell skin cancer    right forearm   TMJ SYNDROME 02/11/2009   Wears dentures      Past Surgical History:  Procedure Laterality Date   CYSTOSCOPY WITH BIOPSY Right 03/01/2024   Procedure: CYSTOSCOPY,;  Surgeon: Devere Lonni Righter, MD;  Location: [facility];  Service: Urology;  Laterality: Right;  CYSTOSCOPY WITH RIGHT URETEROSCOPY, POSSIBLE BIOPSY, RIGHT RETROGRADE PYELOGRAM, STENT PLACEMENT   CYSTOSCOPY WITH URETEROSCOPY AND STENT PLACEMENT Right 03/01/2024   Procedure: CYSTOURETEROSCOPY, WITH STENT INSERTION;  Surgeon: Devere Lonni Righter, MD;  Location: [facility];  Service: Urology;  Laterality: Right;   DILATION AND CURETTAGE OF UTERUS     HERNIA REPAIR     umbilical   IR  IMAGING GUIDED PORT INSERTION  03/06/2024   WISDOM TOOTH EXTRACTION       Outpatient Encounter Medications as of 05/10/2024  Medication Sig Note   acetaminophen -codeine  (TYLENOL  #3) 300-30 MG tablet Take 1 tablet by mouth every 6 (six) hours as needed for moderate pain (pain score 4-6).    ibuprofen  (ADVIL ) 200 MG tablet Take 200 mg by mouth 4 (four) times daily. (Patient taking differently: Take 200 mg by mouth every 4 (four) hours as needed.)    lidocaine -prilocaine  (EMLA ) cream Apply 1 Application topically as needed (Apply to port 1 hour prior to its use, starting with 2nd treatment).    LORazepam  (ATIVAN ) 0.5 MG tablet Take 1 tablet (0.5 mg total) by mouth every 6 (six) hours as needed for anxiety. 05/10/2024: Takes in evening only   ondansetron  (ZOFRAN ) 8 MG tablet Take 1 tablet (8 mg total) by mouth every 8 (eight) hours as needed for nausea or vomiting.    phenazopyridine  (PYRIDIUM ) 200 MG tablet Take 1 tablet (200 mg total) by mouth 3 (three) times daily as needed (for pain with urination). (Patient not taking: Reported on 05/10/2024)    SUMAtriptan  (IMITREX ) 25 MG tablet Take one at onset of migraine and may repeat one in two hours as needed (max of 2 in 24 hours) (Patient not taking: Reported on 05/10/2024)    [DISCONTINUED] pantoprazole  (PROTONIX ) 20 MG tablet TAKE 1 TABLET BY MOUTH EVERY DAY    Facility-Administered Encounter Medications as of 05/10/2024  Medication   0.9 %  sodium chloride   infusion   prochlorperazine  (COMPAZINE ) tablet 10 mg     Today's Vitals   05/10/24 1236 05/10/24 1245  BP: 110/76   Pulse: 97   Resp: 18   Temp: 98.1 F (36.7 C)   TempSrc: Temporal   SpO2: 99%   Weight: 104 lb 11.2 oz (47.5 kg)   Height: 5' 3 (1.6 m)   PainSc:  3    Body mass index is 18.55 kg/m.   ECOG PERFORMANCE STATUS: 2 - Symptomatic, <50% confined to bed  PHYSICAL EXAM GENERAL:alert, no distress and comfortable SKIN: no rash  EYES: sclera clear NECK: without  mass LYMPH:  no palpable cervical or supraclavicular lymphadenopathy  LUNGS: clear with normal breathing effort HEART: regular rate & rhythm, no lower extremity edema ABDOMEN: abdomen soft, non-tender and normal bowel sounds. No CVAT MSK: mild ttp in the lumbar region NEURO: alert & oriented x 3 with fluent speech, no focal motor/sensory deficits PAC without erythema    CBC    Latest Ref Rng & Units 04/27/2024    8:45 AM 04/20/2024    8:40 AM 04/12/2024   12:12 PM  CBC  WBC 4.0 - 10.5 K/uL 7.7  6.3  9.8   Hemoglobin 12.0 - 15.0 g/dL 88.7  89.1  89.2   Hematocrit 36.0 - 46.0 % 35.0  33.4  33.0   Platelets 150 - 400 K/uL 335  346  347       CMP     Latest Ref Rng & Units 04/27/2024    8:45 AM 04/25/2024    1:00 PM 04/20/2024    8:40 AM  CMP  Glucose 70 - 99 mg/dL 90  74  890   BUN 8 - 23 mg/dL 10  11  8    Creatinine 0.44 - 1.00 mg/dL 9.42  9.38  9.33   Sodium 135 - 145 mmol/L 139  139  135   Potassium 3.5 - 5.1 mmol/L 3.2  3.4  2.9   Chloride 98 - 111 mmol/L 101  99  98   CO2 22 - 32 mmol/L 25  21  28    Calcium 8.9 - 10.3 mg/dL 9.4  9.2  8.8   Total Protein 6.5 - 8.1 g/dL 6.1   5.8   Total Bilirubin 0.0 - 1.2 mg/dL 0.5   0.4   Alkaline Phos 38 - 126 U/L 93   91   AST 15 - 41 U/L 14   13   ALT 0 - 44 U/L 6   6       ASSESSMENT & PLAN: Right renal mass CTs abdomen/pelvis 02/01/2024-mild right-sided hydroureteronephrosis extending to the mid to distal ureter; multiple enlarged retroperitoneal lymph nodes involving the periaortic and aortocaval spaces; ovoid hyperdensity along the lower pole of the right kidney measuring 1.8 cm.   CT renal 02/08/2024-1.6 x 1.7 x 2.0 cm hyperattenuating moderately enhancing mass in the right kidney lower pole calyx with subtle extension of tumor into the anteroinferior wall of the right renal pelvis; moderate right hydronephrosis and hydroureter up to the level of the right mid ureter; additional linear bandlike enhancing area in the right upper  ureter; extensive heterogeneous retroperitoneal lymphadenopathy.   PET scan 02/11/2024-hypermetabolic left supraclavicular, AP window, retrocrural, right gastric and retroperitoneal adenopathy; solitary hypermetabolic skeletal lesion in the vicinity of the right anterior S1 sacral foramen. Biopsy left supraclavicular lymph node 02/24/2024-metastatic carcinoma compatible with clinical suspicion of metastatic urothelial carcinoma Cystoscopy/right ureter stent placement 03/01/2024-No intravesical or urethral abnormality seen; solitary right collecting  system with no filling defects or dilation involving the right ureter or right renal pelvis; papillary tumor involving the right mid pole calyx with features concerning for urothelial carcinoma. Cycle 1 day 1 pembrolizumab  and enfortumab 03/09/2024; day 8 held 03/16/2024 due to rash and diarrhea CT chest 03/13/2024-negative for PE; progressive adenopathy in the chest and upper abdomen CT lumbar spine 03/13/2024-known S1 metastasis only faintly visualized.  Loss of cortex at the S1 foramen with possible soft tissue component, possibly abutting the subjacent nerve root.  Upper retroperitoneal adenopathy again seen, progressive. 03/16/2024 cycle 1 day 8 enfortumab held due to diarrhea and rash 03/29/2024 cycle 2-day 1 Pembrolizumab /enfortumab, enfortumab dose reduced due to rash and diarrhea following cycle 1 day 1, day 8 enfortumab 04/06/2024 04/20/2024 cycle 3-day 1 pembrolizumab /enfortumab; day 8 held due to failure to thrive CTs 05/04/2024 report reads progression of nodal and osseous metastasis Back pain secondary to #1 Change in bowel habits, postprandial abdominal pain Emphysema Tobacco use Rash and diarrhea following cycle 1 day 1 Pembrolizumab /enfortumab N/V, GERD, low appetite    Disposition:  Ann Lee appears stable, performance status has improved slightly.   We compared the most recent CT scan to her pretreatment CT and PET scans.  She does  appear to have progressive bone metastasis in the right iliac and sacrum.  She is symptomatic, I sent in a prescription for Tylenol  #3.   In general, the retroperitoneal lymph nodes appear grossly stable, with some decrease in size of the preaortic nodes.  There was a 1 month gap between the 02/11/24 CT and beginning treatment, she may have progressed in that interval.  Patient seen with Dr. Cloretta.  We discussed treatment options including continue Keytruda /Padcev  for 2-3 more cycles then restage versus change therapy to gemcitabine and a platinum based agent.  She would like a second opinion at [facility] which we support and will be happy to refer.  The decision was made to continue Keytruda  alone for now while she awaits  the second opinion.  She will proceed with stent exchange next week, then return for follow-up and Keytruda  on 12/11. We are available to see her sooner if needed.     All questions were answered. The patient knows to call the clinic with any problems, questions or concerns. No barriers to learning were detected.   Dasean Brow K Mohit Zirbes, NP 05/10/2024   This was a shared visit with Derryck Shahan.  Ann Lee was interviewed and examined.  We reviewed the restaging CT findings and images with Ann Lee.  The CTs reveal no significant change in the chest lymphadenopathy.  There appears to be a mixed response involving the retroperitoneal nodes.  The sacral lesion appears significantly enlarged.  The new sclerotic lesions in the thoracic spine may be related scarring from treated metastases.  We discussed treatment options including continuing the current systemic therapy regimen with short interval restaging.  We discussed changing to a different systemic therapy regimen such as gemcitabine/cisplatin.  She requests a second opinion at [facility].  We will make a referral.  Ann Lee.  She prefers continuing  treatment with single agent pembrolizumab  for now.  Arvella Cloretta, MD

## 2024-05-10 ENCOUNTER — Inpatient Hospital Stay: Admitting: Nurse Practitioner

## 2024-05-10 ENCOUNTER — Inpatient Hospital Stay

## 2024-05-10 ENCOUNTER — Encounter: Payer: Self-pay | Admitting: Nurse Practitioner

## 2024-05-10 VITALS — BP 110/76 | HR 97 | Temp 98.1°F | Resp 18 | Ht 63.0 in | Wt 104.7 lb

## 2024-05-10 DIAGNOSIS — Z452 Encounter for adjustment and management of vascular access device: Secondary | ICD-10-CM | POA: Diagnosis not present

## 2024-05-10 DIAGNOSIS — C770 Secondary and unspecified malignant neoplasm of lymph nodes of head, face and neck: Secondary | ICD-10-CM | POA: Diagnosis not present

## 2024-05-10 DIAGNOSIS — C7951 Secondary malignant neoplasm of bone: Secondary | ICD-10-CM | POA: Diagnosis not present

## 2024-05-10 DIAGNOSIS — C689 Malignant neoplasm of urinary organ, unspecified: Secondary | ICD-10-CM | POA: Diagnosis not present

## 2024-05-10 DIAGNOSIS — C641 Malignant neoplasm of right kidney, except renal pelvis: Secondary | ICD-10-CM

## 2024-05-10 DIAGNOSIS — Z5112 Encounter for antineoplastic immunotherapy: Secondary | ICD-10-CM | POA: Diagnosis not present

## 2024-05-10 DIAGNOSIS — Z79620 Long term (current) use of immunosuppressive biologic: Secondary | ICD-10-CM | POA: Diagnosis not present

## 2024-05-10 DIAGNOSIS — C679 Malignant neoplasm of bladder, unspecified: Secondary | ICD-10-CM | POA: Diagnosis not present

## 2024-05-10 MED ORDER — ACETAMINOPHEN-CODEINE 300-30 MG PO TABS: 1.0000 | ORAL_TABLET | Freq: Four times a day (QID) | ORAL | 0 refills | Status: DC | PRN

## 2024-05-15 ENCOUNTER — Encounter: Payer: Self-pay | Admitting: *Deleted

## 2024-05-15 NOTE — Progress Notes (Signed)
 Referral for 2nd opinion placed for [facility] with confirmation number  [id number]

## 2024-05-16 ENCOUNTER — Telehealth: Payer: Self-pay | Admitting: Oncology

## 2024-05-16 NOTE — Telephone Encounter (Signed)
 Rescheduled patient appt due to patient having another appt. Day and time confirmed with patient.

## 2024-05-17 ENCOUNTER — Inpatient Hospital Stay

## 2024-05-17 ENCOUNTER — Inpatient Hospital Stay: Admitting: Nurse Practitioner

## 2024-05-18 ENCOUNTER — Telehealth: Payer: Self-pay | Admitting: Nurse Practitioner

## 2024-05-18 ENCOUNTER — Encounter: Payer: Self-pay | Admitting: Oncology

## 2024-05-18 ENCOUNTER — Inpatient Hospital Stay: Admitting: Nurse Practitioner

## 2024-05-18 ENCOUNTER — Encounter: Payer: Self-pay | Admitting: Nurse Practitioner

## 2024-05-18 ENCOUNTER — Inpatient Hospital Stay

## 2024-05-18 NOTE — Telephone Encounter (Signed)
 I called Ann Lee to follow-up on her earlier conversation with Lacie Burton, NP.  She woke up at about 2 AM with sudden onset of pain above left hip.  No known injury though she was more active yesterday than she has been.  She is able to move the left arm and left leg without difficulty.  The pain increases when she moves her right arm or leg.  No leg weakness or numbness.  No shortness of breath or cough.  The pain is better since taking a pain pill.  She does not want to go to the emergency department.  She understands we recommend emergency department evaluation if the pain increases again or she develops other symptoms.  She will contact the office tomorrow morning with an update.

## 2024-05-18 NOTE — Progress Notes (Signed)
 I called pt back in response to a message regarding new symptoms. She awoke this morning with severe left side/flank pain. Took tylenol  #3 which helped but pain did not resolve. She can move all extremities but needed help to stand up, no loss of bladder/bowel function or new numbness. Voiding without difficulty, no hematuria, fever/chills, cough, chest pain, dyspnea. She is not in distress on the phone. Felt well yesterday and was more active, thinks she may have pulled a muscle.   Chart reviewed for wide differential including renal etiology vs pain from RP adenopathy or thoracic metastasis vs others.   I recommend ED for evaluation but she declined. She was tentatively given an appointment in our office 12/5 but will discuss further with other providers for their input.  She understands in the case of emergency to call 911/go to ED.   Ann Awtrey, NP

## 2024-05-19 ENCOUNTER — Other Ambulatory Visit: Payer: Self-pay | Admitting: Nurse Practitioner

## 2024-05-19 ENCOUNTER — Telehealth: Payer: Self-pay | Admitting: Nurse Practitioner

## 2024-05-19 DIAGNOSIS — C641 Malignant neoplasm of right kidney, except renal pelvis: Secondary | ICD-10-CM

## 2024-05-19 MED ORDER — LORAZEPAM 0.5 MG PO TABS: 0.5000 mg | ORAL_TABLET | Freq: Four times a day (QID) | ORAL | 0 refills | Status: DC | PRN

## 2024-05-19 NOTE — Telephone Encounter (Signed)
 I called Ann Lee to follow-up on the pain she was experiencing yesterday.  She took an Ativan  last night with marked improvement.  No pain at present.  She was able to eat a good breakfast.  She does not feel she needs to come in today for a visit.  She will follow-up as scheduled next week.  She requested a refill on Ativan .  I sent the prescription to her pharmacy.

## 2024-05-23 DIAGNOSIS — Z8739 Personal history of other diseases of the musculoskeletal system and connective tissue: Secondary | ICD-10-CM | POA: Diagnosis not present

## 2024-05-23 DIAGNOSIS — I10 Essential (primary) hypertension: Secondary | ICD-10-CM | POA: Diagnosis not present

## 2024-05-23 DIAGNOSIS — Z960 Presence of urogenital implants: Secondary | ICD-10-CM | POA: Diagnosis not present

## 2024-05-23 DIAGNOSIS — Z79891 Long term (current) use of opiate analgesic: Secondary | ICD-10-CM | POA: Diagnosis not present

## 2024-05-23 DIAGNOSIS — C7951 Secondary malignant neoplasm of bone: Secondary | ICD-10-CM | POA: Diagnosis not present

## 2024-05-23 DIAGNOSIS — C651 Malignant neoplasm of right renal pelvis: Secondary | ICD-10-CM | POA: Diagnosis not present

## 2024-05-23 DIAGNOSIS — R112 Nausea with vomiting, unspecified: Secondary | ICD-10-CM | POA: Diagnosis not present

## 2024-05-23 DIAGNOSIS — M5459 Other low back pain: Secondary | ICD-10-CM | POA: Diagnosis not present

## 2024-05-23 DIAGNOSIS — K521 Toxic gastroenteritis and colitis: Secondary | ICD-10-CM | POA: Diagnosis not present

## 2024-05-23 DIAGNOSIS — T451X5D Adverse effect of antineoplastic and immunosuppressive drugs, subsequent encounter: Secondary | ICD-10-CM | POA: Diagnosis not present

## 2024-05-23 DIAGNOSIS — Z801 Family history of malignant neoplasm of trachea, bronchus and lung: Secondary | ICD-10-CM | POA: Diagnosis not present

## 2024-05-23 DIAGNOSIS — Z800 Family history of malignant neoplasm of digestive organs: Secondary | ICD-10-CM | POA: Diagnosis not present

## 2024-05-23 DIAGNOSIS — F17210 Nicotine dependence, cigarettes, uncomplicated: Secondary | ICD-10-CM | POA: Diagnosis not present

## 2024-05-23 DIAGNOSIS — Z79899 Other long term (current) drug therapy: Secondary | ICD-10-CM | POA: Diagnosis not present

## 2024-05-23 DIAGNOSIS — C778 Secondary and unspecified malignant neoplasm of lymph nodes of multiple regions: Secondary | ICD-10-CM | POA: Diagnosis not present

## 2024-05-25 ENCOUNTER — Emergency Department (HOSPITAL_COMMUNITY)
Admission: EM | Admit: 2024-05-25 | Discharge: 2024-05-25 | Disposition: A | Discharge status: Home / Self Care | Attending: Emergency Medicine | Admitting: Emergency Medicine

## 2024-05-25 ENCOUNTER — Other Ambulatory Visit: Payer: Self-pay

## 2024-05-25 ENCOUNTER — Emergency Department (HOSPITAL_COMMUNITY)

## 2024-05-25 ENCOUNTER — Inpatient Hospital Stay

## 2024-05-25 ENCOUNTER — Encounter (HOSPITAL_COMMUNITY): Payer: Self-pay

## 2024-05-25 ENCOUNTER — Inpatient Hospital Stay: Admitting: Nurse Practitioner

## 2024-05-25 ENCOUNTER — Telehealth: Payer: Self-pay | Admitting: Nurse Practitioner

## 2024-05-25 DIAGNOSIS — C641 Malignant neoplasm of right kidney, except renal pelvis: Secondary | ICD-10-CM | POA: Diagnosis not present

## 2024-05-25 DIAGNOSIS — I10 Essential (primary) hypertension: Secondary | ICD-10-CM | POA: Diagnosis not present

## 2024-05-25 DIAGNOSIS — M25551 Pain in right hip: Secondary | ICD-10-CM | POA: Diagnosis not present

## 2024-05-25 DIAGNOSIS — Z85828 Personal history of other malignant neoplasm of skin: Secondary | ICD-10-CM | POA: Insufficient documentation

## 2024-05-25 DIAGNOSIS — C7951 Secondary malignant neoplasm of bone: Secondary | ICD-10-CM | POA: Diagnosis not present

## 2024-05-25 DIAGNOSIS — E860 Dehydration: Secondary | ICD-10-CM | POA: Diagnosis not present

## 2024-05-25 DIAGNOSIS — C799 Secondary malignant neoplasm of unspecified site: Secondary | ICD-10-CM | POA: Diagnosis not present

## 2024-05-25 DIAGNOSIS — C4021 Malignant neoplasm of long bones of right lower limb: Secondary | ICD-10-CM | POA: Diagnosis not present

## 2024-05-25 LAB — CBC WITH DIFFERENTIAL/PLATELET
Abs Immature Granulocytes: 0.04 K/uL (ref 0.00–0.07)
Basophils Absolute: 0.1 K/uL (ref 0.0–0.1)
Basophils Relative: 1 %
Eosinophils Absolute: 0.5 K/uL (ref 0.0–0.5)
Eosinophils Relative: 7 %
HCT: 30.1 % — ABNORMAL LOW (ref 36.0–46.0)
Hemoglobin: 9.6 g/dL — ABNORMAL LOW (ref 12.0–15.0)
Immature Granulocytes: 1 %
Lymphocytes Relative: 10 %
Lymphs Abs: 0.8 K/uL (ref 0.7–4.0)
MCH: 28.2 pg (ref 26.0–34.0)
MCHC: 31.9 g/dL (ref 30.0–36.0)
MCV: 88.3 fL (ref 80.0–100.0)
Monocytes Absolute: 0.8 K/uL (ref 0.1–1.0)
Monocytes Relative: 10 %
Neutro Abs: 5.9 K/uL (ref 1.7–7.7)
Neutrophils Relative %: 71 %
Platelets: 334 K/uL (ref 150–400)
RBC: 3.41 MIL/uL — ABNORMAL LOW (ref 3.87–5.11)
RDW: 15.9 % — ABNORMAL HIGH (ref 11.5–15.5)
WBC: 8.2 K/uL (ref 4.0–10.5)
nRBC: 0 % (ref 0.0–0.2)

## 2024-05-25 LAB — COMPREHENSIVE METABOLIC PANEL WITH GFR
ALT: <5 U/L (ref 0–44)
AST: 17 U/L (ref 15–41)
Albumin: 2.7 g/dL — ABNORMAL LOW (ref 3.5–5.0)
Alkaline Phosphatase: 117 U/L (ref 38–126)
Anion gap: 12 (ref 5–15)
BUN: 12 mg/dL (ref 8–23)
CO2: 26 mmol/L (ref 22–32)
Calcium: 9 mg/dL (ref 8.9–10.3)
Chloride: 98 mmol/L (ref 98–111)
Creatinine, Ser: 0.46 mg/dL (ref 0.44–1.00)
GFR, Estimated: >60 mL/min (ref >60)
Glucose, Bld: 97 mg/dL (ref 70–99)
Potassium: 3.3 mmol/L — ABNORMAL LOW (ref 3.5–5.1)
Sodium: 136 mmol/L (ref 135–145)
Total Bilirubin: 0.4 mg/dL (ref 0.0–1.2)
Total Protein: 5.4 g/dL — ABNORMAL LOW (ref 6.5–8.1)

## 2024-05-25 LAB — LIPASE, BLOOD: Lipase: <10 U/L — ABNORMAL LOW (ref 11–51)

## 2024-05-25 MED ORDER — ACETAMINOPHEN 325 MG PO TABS: 650.0000 mg | ORAL_TABLET | Freq: Four times a day (QID) | ORAL | 0 refills | Status: DC | PRN

## 2024-05-25 MED ORDER — MORPHINE SULFATE (PF) 4 MG/ML IV SOLN
4.0000 mg | Freq: Once | INTRAVENOUS | Status: AC
  Administered 2024-05-25: 4 mg via INTRAVENOUS
  Filled 2024-05-25: qty 1

## 2024-05-25 MED ORDER — OXYCODONE HCL 5 MG PO TABS
5.0000 mg | ORAL_TABLET | Freq: Once | ORAL | Status: AC
  Administered 2024-05-25: 5 mg via ORAL
  Filled 2024-05-25: qty 1

## 2024-05-25 MED ORDER — KETOROLAC TROMETHAMINE 15 MG/ML IJ SOLN
15.0000 mg | Freq: Once | INTRAMUSCULAR | Status: AC
  Administered 2024-05-25: 15 mg via INTRAVENOUS
  Filled 2024-05-25: qty 1

## 2024-05-25 MED ORDER — ONDANSETRON HCL 4 MG/2ML IJ SOLN
4.0000 mg | Freq: Once | INTRAMUSCULAR | Status: AC
  Administered 2024-05-25: 4 mg via INTRAVENOUS
  Filled 2024-05-25: qty 2

## 2024-05-25 MED ORDER — CYCLOBENZAPRINE HCL 5 MG PO TABS: 5.0000 mg | ORAL_TABLET | Freq: Two times a day (BID) | ORAL | 0 refills | Status: DC | PRN

## 2024-05-25 MED ORDER — CYCLOBENZAPRINE HCL 10 MG PO TABS
5.0000 mg | ORAL_TABLET | Freq: Once | ORAL | Status: AC
  Administered 2024-05-25: 5 mg via ORAL
  Filled 2024-05-25: qty 1

## 2024-05-25 MED ORDER — OXYCODONE HCL 5 MG PO TABS: 5.0000 mg | ORAL_TABLET | ORAL | 0 refills | Status: DC | PRN

## 2024-05-25 MED ORDER — SODIUM CHLORIDE 0.9 % IV BOLUS
500.0000 mL | Freq: Once | INTRAVENOUS | Status: AC
  Administered 2024-05-25: 500 mL via INTRAVENOUS

## 2024-05-25 NOTE — ED Provider Notes (Signed)
 [place] EMERGENCY DEPARTMENT AT [facility] Provider Note  CSN: [id number] Arrival date & time: 05/25/24 1025  Chief Complaint(s) Fall  HPI Ann Lee is a 68 y.o. female with past medical history as below, significant for endometriosis, IDA, AAA, urothelial carcinoma of the right kidney metastatic who presents to the ED with complaint of right hip pain  Patient receiving chemotherapy for right sided urothelial carcinoma.  Been having severe pain to her right low back and right hip over the past few days.  She is having difficulty walking secondary to the severe right leg pain.  Pain primarily to her right hip, goes down her right leg.  Difficulty ambulating secondary to pain.  Difficulty moving from a seated to standing position secondary to the severe pain.  Has been using heat pads and lidocaine  patches and T3 with minimal improvement.  Poor appetite, increased fatigue over the past few days.  She had CT chest abdomen pelvis performed 11/20 which demonstrated progressive metastatic disease, worsening lymphadenopathy, mets to the liver, possible peritoneal mets Disease, worsening right sided sacral lytic lesions, right iliac lesion multiple sclerotic lesions of the thoracic spine  Dr Cloretta oncology   Past Medical History Past Medical History:  Diagnosis Date   Endometriosis    HERPES ZOSTER 10/18/2008   History of iron deficiency anemia    Hypertension    Bp'a have been running low, holding medication at this time   Migraines    NECK PAIN 02/15/2009   PARESTHESIA 10/30/2009   PERS HX TOBACCO USE PRESENTING HAZARDS HEALTH 03/10/2010   SINUSITIS, ACUTE 05/03/2009   SORE THROAT 02/12/2009   Squamous cell skin cancer    right forearm   TMJ SYNDROME 02/11/2009   Wears dentures    Patient Active Problem List   Diagnosis Date Noted   Urothelial carcinoma of kidney, right (HCC) 03/02/2024   Essential hypertension 09/24/2023   AAA (abdominal aortic aneurysm)  03/23/2023   History of endometriosis 02/26/2023   SCCA (squamous cell carcinoma) of skin 10/06/2015   Rash 02/23/2015   Pain of left great toe 06/03/2013   Migraine headache 11/28/2012   PERS HX TOBACCO USE PRESENTING HAZARDS HEALTH 03/10/2010   PARESTHESIA 10/30/2009   SINUSITIS, ACUTE 05/03/2009   NECK PAIN 02/15/2009   SORE THROAT 02/12/2009   Temporomandibular joint disorder 02/11/2009   Herpes zoster 10/18/2008   Home Medication(s) Prior to Admission medications  Medication Sig Start Date End Date Taking? Authorizing Provider  acetaminophen  (TYLENOL ) 325 MG tablet Take 2 tablets (650 mg total) by mouth every 6 (six) hours as needed. 05/25/24  Yes Elnor Savant A, DO  cyclobenzaprine  (FLEXERIL ) 5 MG tablet Take 1 tablet (5 mg total) by mouth 2 (two) times daily as needed for muscle spasms. 05/25/24  Yes Elnor Savant A, DO  ibuprofen  (ADVIL ) 200 MG tablet Take 200 mg by mouth 4 (four) times daily. Patient taking differently: Take 200 mg by mouth every 4 (four) hours as needed.    [provider]  lidocaine -prilocaine  (EMLA ) cream Apply 1 Application topically as needed (Apply to port 1 hour prior to its use, starting with 2nd treatment). 03/07/24   Cloretta Arley NOVAK, MD  LORazepam  (ATIVAN ) 0.5 MG tablet Take 1 tablet (0.5 mg total) by mouth every 6 (six) hours as needed for anxiety. 05/19/24   Thomas, Lisa K, NP  ondansetron  (ZOFRAN ) 8 MG tablet Take 1 tablet (8 mg total) by mouth every 8 (eight) hours as needed for nausea or vomiting. 04/20/24  Cloretta Arley NOVAK, MD  oxyCODONE  (ROXICODONE ) 5 MG immediate release tablet Take 1 tablet (5 mg total) by mouth every 4 (four) hours as needed for severe pain (pain score 7-10). 05/25/24  Yes Elnor Savant A, DO  phenazopyridine  (PYRIDIUM ) 200 MG tablet Take 1 tablet (200 mg total) by mouth 3 (three) times daily as needed (for pain with urination). Patient not taking: Reported on 05/10/2024 03/01/24 03/01/25  Devere Lonni Righter, MD   SUMAtriptan  (IMITREX ) 25 MG tablet Take one at onset of migraine and may repeat one in two hours as needed (max of 2 in 24 hours) Patient not taking: No sig reported 05/02/24   Micheal Wolm ORN, MD                                                                                                                                    Past Surgical History Past Surgical History:  Procedure Laterality Date   CYSTOSCOPY WITH BIOPSY Right 03/01/2024   Procedure: CYSTOSCOPY,;  Surgeon: Devere Lonni Righter, MD;  Location: [facility];  Service: Urology;  Laterality: Right;  CYSTOSCOPY WITH RIGHT URETEROSCOPY, POSSIBLE BIOPSY, RIGHT RETROGRADE PYELOGRAM, STENT PLACEMENT   CYSTOSCOPY WITH URETEROSCOPY AND STENT PLACEMENT Right 03/01/2024   Procedure: CYSTOURETEROSCOPY, WITH STENT INSERTION;  Surgeon: Devere Lonni Righter, MD;  Location: [facility];  Service: Urology;  Laterality: Right;   DILATION AND CURETTAGE OF UTERUS     HERNIA REPAIR     umbilical   IR IMAGING GUIDED PORT INSERTION  03/06/2024   WISDOM TOOTH EXTRACTION     Family History Family History  Problem Relation Age of Onset   Arthritis Mother    Non-Hodgkin's lymphoma Mother    Esophageal cancer Mother    Stroke Mother    Hyperlipidemia Father    Arthritis Father    Colon cancer Father    Lung cancer Sister     Social History Social History[1] Allergies Shellfish allergy  Review of Systems A thorough review of systems was obtained and all systems are negative except as noted in the HPI and PMH.   Physical Exam Vital Signs  I have reviewed the triage vital signs BP 125/64 (BP Location: Right Arm)   Pulse 85   Temp 97.9 F (36.6 C) (Oral)   Resp 18   SpO2 100%  Physical Exam Vitals and nursing note reviewed.  Constitutional:      General: She is not in acute distress.    Appearance: Normal appearance. She is well-developed. She is not ill-appearing.  HENT:     Head: Normocephalic and atraumatic.     Right Ear:  External ear normal.     Left Ear: External ear normal.     Nose: Nose normal.     Mouth/Throat:     Mouth: Mucous membranes are moist.  Eyes:     General: No scleral icterus.       Right eye: No discharge.  Left eye: No discharge.  Cardiovascular:     Rate and Rhythm: Normal rate.  Pulmonary:     Effort: Pulmonary effort is normal. No respiratory distress.     Breath sounds: No stridor.  Abdominal:     General: Abdomen is flat. There is no distension.     Tenderness: There is no guarding.  Musculoskeletal:        General: No deformity.     Cervical back: No rigidity.       Legs:     Comments: No midline spinous process tenderness to palpation or percussion, no crepitus or step-off.    LE NVI   Skin:    General: Skin is warm and dry.     Coloration: Skin is not cyanotic, jaundiced or pale.  Neurological:     Mental Status: She is alert.  Psychiatric:        Speech: Speech normal.        Behavior: Behavior normal. Behavior is cooperative.     ED Results and Treatments Labs (all labs ordered are listed, but only abnormal results are displayed) Labs Reviewed  CBC WITH DIFFERENTIAL/PLATELET - Abnormal; Notable for the following components:      Result Value   RBC 3.41 (*)    Hemoglobin 9.6 (*)    HCT 30.1 (*)    RDW 15.9 (*)    All other components within normal limits  COMPREHENSIVE METABOLIC PANEL WITH GFR - Abnormal; Notable for the following components:   Potassium 3.3 (*)    Total Protein 5.4 (*)    Albumin 2.7 (*)    All other components within normal limits  LIPASE, BLOOD - Abnormal; Notable for the following components:   Lipase <10 (*)    All other components within normal limits  URINALYSIS, ROUTINE W REFLEX MICROSCOPIC                                                                                                                          Radiology DG Femur Min 2 Views Right Result Date: 05/25/2024 EXAM: XR Right Femur, 2 View. CLINICAL  HISTORY: Malignancy, right femur/hip pain. COMPARISON: None provided. FINDINGS: BONES: No acute fracture or focal osseous lesion. JOINTS: No dislocation. The joint spaces are normal. SOFT TISSUES: The soft tissues are unremarkable. IMPRESSION: No acute osseous abnormality. Electronically signed by: Lynwood Seip MD 05/25/2024 01:59 PM EST RP Workstation: [id number]   DG Hip Unilat W or Wo Pelvis 2-3 Views Right Result Date: 05/25/2024 EXAM: XR right hip, 2 or 3 View. CLINICAL HISTORY: right hip pain, metastatic dz COMPARISON: 21 days ago. FINDINGS: BONES: A right supra-acetabular destructive lesion noted on a prior CT scan is not well visualized on these radiographs. No definite fracture is noted. JOINTS: No dislocation. The joint spaces are normal. SOFT TISSUES: The soft tissues are unremarkable. IMPRESSION: 1. No acute osseous abnormality. 2. Right supra-acetabular destructive lesion seen on prior CT is not well visualized on these radiographs. Electronically signed by:  Lynwood Seip MD 05/25/2024 01:59 PM EST RP Workstation: [id number]    Pertinent labs & imaging results that were available during my care of the patient were reviewed by me and considered in my medical decision making (see MDM for details).  Medications Ordered in ED Medications  oxyCODONE  (Oxy IR/ROXICODONE ) immediate release tablet 5 mg (has no administration in time range)  cyclobenzaprine  (FLEXERIL ) tablet 5 mg (has no administration in time range)  morphine (PF) 4 MG/ML injection 4 mg (4 mg Intravenous Given 05/25/24 1229)  ondansetron  (ZOFRAN ) injection 4 mg (4 mg Intravenous Given 05/25/24 1226)  sodium chloride  0.9 % bolus 500 mL (500 mLs Intravenous New Bag/Given 05/25/24 1231)  ketorolac  (TORADOL ) 15 MG/ML injection 15 mg (15 mg Intravenous Given 05/25/24 1236)                                                                                                                                      Procedures Procedures  (including critical care time)  Medical Decision Making / ED Course    Medical Decision Making:    Leyton Brownlee is a 68 y.o. female  with past medical history as below, significant for endometriosis, IDA, AAA, urothelial carcinoma of the right kidney metastatic who presents to the ED with complaint of right hip pain. The complaint involves an extensive differential diagnosis and also carries with it a high risk of complications and morbidity.  Serious etiology was considered. Ddx includes but is not limited to: Metastatic disease, pathologic fracture, dislocation, appendicitis, nephrolithiasis, musculoskeletal back pain, renal colic, urinary tract infection, pyelonephritis, intra-abdominal causes of back pain, aortic aneurysm or dissection, cauda equina syndrome, sciatica, lumbar disc disease, thoracic disc disease, etc.   Complete initial physical exam performed, notably the patient was in NAD but appears to be in acute pain.    Reviewed and confirmed nursing documentation for past medical history, family history, social history.  Vital signs reviewed.    Right sided lumbar pain Right pelvis/hip pain  Metastatic disease to bone> - hx metastatic renal dz to spin and pelvis - Padcev , Keytruda  last around 4 wks ago - She is feeling better, she has appoint with oncology tomorrow.  Family will take her home and assist with her getting to her oncology, tomorrow.   Clinical Course as of 05/25/24 1453  Thu May 25, 2024  1448 Labs stable XR c/w known metastatic disease [SG]  1448 I recommended admission for the patient for pain control, she prefers to trial pain medication at home.  She does not was in the hospital at this time.  Will discontinue her T3 and switch to oxycodone . [SG]    Clinical Course User Index [SG] Elnor Jayson LABOR, DO     2:53 PM:  I have discussed the diagnosis/risks/treatment options with the patient and family.  Evaluation and diagnostic  testing in the emergency department does not suggest an emergent condition requiring admission or  immediate intervention beyond what has been performed at this time.  They will follow up with PCP, oncology. We also discussed returning to the ED immediately if new or worsening sx occur. We discussed the sx which are most concerning (e.g., sudden worsening pain, fever, inability to tolerate by mouth) that necessitate immediate return.    The patient appears reasonably screened and/or stabilized for discharge and I doubt any other medical condition or other [facility] requiring further screening, evaluation, or treatment in the ED at this time prior to discharge.                 Additional history obtained: -Additional history obtained from family -External records from outside source obtained and reviewed including: Chart review including previous notes, labs, imaging, consultation notes including  PDMP, prior oncology documentation   Lab Tests: -I ordered, reviewed, and interpreted labs.   The pertinent results include:   Labs Reviewed  CBC WITH DIFFERENTIAL/PLATELET - Abnormal; Notable for the following components:      Result Value   RBC 3.41 (*)    Hemoglobin 9.6 (*)    HCT 30.1 (*)    RDW 15.9 (*)    All other components within normal limits  COMPREHENSIVE METABOLIC PANEL WITH GFR - Abnormal; Notable for the following components:   Potassium 3.3 (*)    Total Protein 5.4 (*)    Albumin 2.7 (*)    All other components within normal limits  LIPASE, BLOOD - Abnormal; Notable for the following components:   Lipase <10 (*)    All other components within normal limits  URINALYSIS, ROUTINE W REFLEX MICROSCOPIC    Notable for labs are stable  EKG   EKG Interpretation Date/Time:    Ventricular Rate:    PR Interval:    QRS Duration:    QT Interval:    QTC Calculation:   R Axis:      Text Interpretation:           Imaging Studies ordered: I ordered imaging studies  including x-ray, hip pelvis x-ray I independently visualized the following imaging with scope of interpretation limited to determining acute life threatening conditions related to emergency care; findings noted above I agree with the radiologist interpretation If any imaging was obtained with contrast I closely monitored patient for any possible adverse reaction a/w contrast administration in the emergency department   Medicines ordered and prescription drug management: Meds ordered this encounter  Medications   morphine (PF) 4 MG/ML injection 4 mg   ondansetron  (ZOFRAN ) injection 4 mg   sodium chloride  0.9 % bolus 500 mL   ketorolac  (TORADOL ) 15 MG/ML injection 15 mg   oxyCODONE  (Oxy IR/ROXICODONE ) immediate release tablet 5 mg    Refill:  0   cyclobenzaprine  (FLEXERIL ) tablet 5 mg   oxyCODONE  (ROXICODONE ) 5 MG immediate release tablet    Sig: Take 1 tablet (5 mg total) by mouth every 4 (four) hours as needed for severe pain (pain score 7-10).    Dispense:  15 tablet    Refill:  0   acetaminophen  (TYLENOL ) 325 MG tablet    Sig: Take 2 tablets (650 mg total) by mouth every 6 (six) hours as needed.    Dispense:  36 tablet    Refill:  0   cyclobenzaprine  (FLEXERIL ) 5 MG tablet    Sig: Take 1 tablet (5 mg total) by mouth 2 (two) times daily as needed for muscle spasms.    Dispense:  20 tablet    Refill:  0    -I have reviewed the patients home medicines and have made adjustments as needed   Consultations Obtained: Not applicable  Cardiac Monitoring: Continuous pulse oximetry interpreted by myself, 100% on RA.    Social Determinants of Health:  Diagnosis or treatment significantly limited by social determinants of health: current smoker   Reevaluation: After the interventions noted above, I reevaluated the patient and found that they have improved  Co morbidities that complicate the patient evaluation  Past Medical History:  Diagnosis Date   Endometriosis    HERPES  ZOSTER 10/18/2008   History of iron deficiency anemia    Hypertension    Bp'a have been running low, holding medication at this time   Migraines    NECK PAIN 02/15/2009   PARESTHESIA 10/30/2009   PERS HX TOBACCO USE PRESENTING HAZARDS HEALTH 03/10/2010   SINUSITIS, ACUTE 05/03/2009   SORE THROAT 02/12/2009   Squamous cell skin cancer    right forearm   TMJ SYNDROME 02/11/2009   Wears dentures       Dispostion: Disposition decision including need for hospitalization was considered, and patient discharged from emergency department.    Final Clinical Impression(s) / ED Diagnoses Final diagnoses:  Metastatic malignant neoplasm, unspecified site (HCC)         [1]  Social History Tobacco Use   Smoking status: Every Day    Current packs/day: 1.00    Average packs/day: 1 pack/day for 41.0 years (41.0 ttl pk-yrs)    Types: Cigarettes   Smokeless tobacco: Never   Tobacco comments:    She and sister are going to work on quitting together  Vaping Use   Vaping status: Never Used  Substance Use Topics   Alcohol use: No    Alcohol/week: 0.0 standard drinks of alcohol   Drug use: No     Elnor Jayson LABOR, DO 05/25/24 1453

## 2024-05-25 NOTE — Discharge Instructions (Addendum)
 It was a pleasure caring for you today in the emergency department.  Please follow-up with your oncologist tomorrow.  If your pain becomes unbearable or difficult to manage please return to the ER for further evaluation.  Would likely benefit from evaluation with pain management as pain associated with cancer can be severe and difficult to treat  Please return to the emergency department for any worsening or worrisome symptoms.

## 2024-05-25 NOTE — ED Triage Notes (Signed)
 Bib from [facility] from home due to right leg pain and weakness and stumbled onto couch.  Pt diagnosed with kidney cancer, last received chemo 4 weeks ago. Pt states the pain starts at her spine and goes down her right leg. Denies numbness/tingling. Pt also has not had a decreased appetite.

## 2024-05-26 ENCOUNTER — Inpatient Hospital Stay: Attending: Oncology | Admitting: Nurse Practitioner

## 2024-05-26 ENCOUNTER — Inpatient Hospital Stay

## 2024-05-26 ENCOUNTER — Encounter: Payer: Self-pay | Admitting: Nurse Practitioner

## 2024-05-26 ENCOUNTER — Encounter: Payer: Self-pay | Admitting: *Deleted

## 2024-05-26 VITALS — BP 104/61 | HR 88 | Temp 97.8°F | Resp 18 | Ht 63.0 in | Wt 102.2 lb

## 2024-05-26 DIAGNOSIS — G893 Neoplasm related pain (acute) (chronic): Secondary | ICD-10-CM | POA: Insufficient documentation

## 2024-05-26 DIAGNOSIS — R630 Anorexia: Secondary | ICD-10-CM | POA: Diagnosis not present

## 2024-05-26 DIAGNOSIS — C641 Malignant neoplasm of right kidney, except renal pelvis: Secondary | ICD-10-CM | POA: Diagnosis present

## 2024-05-26 DIAGNOSIS — C7951 Secondary malignant neoplasm of bone: Secondary | ICD-10-CM | POA: Diagnosis not present

## 2024-05-26 MED ORDER — PREDNISONE 10 MG PO TABS: 10.0000 mg | ORAL_TABLET | Freq: Every day | ORAL | 0 refills | Status: DC

## 2024-05-26 MED ORDER — OXYCODONE HCL ER 10 MG PO T12A: 10.0000 mg | EXTENDED_RELEASE_TABLET | Freq: Two times a day (BID) | ORAL | 0 refills | Status: DC

## 2024-05-26 MED ORDER — OXYCODONE HCL 5 MG PO TABS: 5.0000 mg | ORAL_TABLET | ORAL | 0 refills | Status: DC | PRN

## 2024-05-26 NOTE — Progress Notes (Signed)
 Had patient sign ROI to allow information exchange on CareEverywhere with [facility] of medical records. Forwarded signed release to [email or web address] Sent chat to radiation oncology re: urgent referral. Nurse navigator made aware of need for record sharing. Patient provided copy of all her scan reports. They request her images be pushed over and also want to pick up a CD with all the scans.

## 2024-05-26 NOTE — Progress Notes (Addendum)
 [facility] OFFICE PROGRESS NOTE   Diagnosis: Urothelial carcinoma  INTERVAL HISTORY:   Ann Lee returns as scheduled.  She is seen prior to proceeding with Pembrolizumab .  She was evaluated in the emergency department yesterday due to increased pain at the right hip.  Right femur x-ray negative.  Right hip x-ray negative for acute abnormality.  The right supra-acetabular destructive lesion seen on prior CT not well-visualized.  She received IV morphine , Toradol , oxycodone  and cyclobenzaprine  in the emergency department.  Admission for pain control was recommended and declined.  She felt better after the above interventions and was discharged home.  At rest pain is controlled with oxycodone  every 4 hours and Flexeril  twice a day.  The pain significantly worsens with any activity.  In general she feels weak.  She feels she has a good appetite.  Family disagrees.  They are interested in an appetite stimulant.  She has tried mirtazapine  in the past but did not tolerate well and discontinued it.  When she has taken prednisone  in the past son notes a marked improvement in appetite.  She denies significant nausea/vomiting.  Family also disagrees with this.  She reports being seen at [facility] a few days ago.  Family states recommendation is for gemcitabine/cisplatin.  Objective:  Vital signs in last 24 hours:  Blood pressure 104/61, pulse 88, temperature 97.8 F (36.6 C), temperature source Temporal, resp. rate 18, height 5' 3 (1.6 m), weight 102 lb 3.2 oz (46.4 kg), SpO2 100%.    HEENT: No thrush or ulcers. Resp: Lungs clear bilaterally. Cardio: Regular rate and rhythm. GI: No hepatosplenomegaly.  Nontender. Vascular: No leg edema.  Port-A-Cath without erythema.  Lab Results:  Lab Results  Component Value Date   WBC 8.2 05/25/2024   HGB 9.6 (L) 05/25/2024   HCT 30.1 (L) 05/25/2024   MCV 88.3 05/25/2024   PLT 334 05/25/2024   NEUTROABS 5.9 05/25/2024     Imaging:  DG Femur Min 2 Views Right Result Date: 05/25/2024 EXAM: XR Right Femur, 2 View. CLINICAL HISTORY: Malignancy, right femur/hip pain. COMPARISON: None provided. FINDINGS: BONES: No acute fracture or focal osseous lesion. JOINTS: No dislocation. The joint spaces are normal. SOFT TISSUES: The soft tissues are unremarkable. IMPRESSION: No acute osseous abnormality. Electronically signed by: Lynwood Seip MD 05/25/2024 01:59 PM EST RP Workstation: [id number]   DG Hip Unilat W or Wo Pelvis 2-3 Views Right Result Date: 05/25/2024 EXAM: XR right hip, 2 or 3 View. CLINICAL HISTORY: right hip pain, metastatic dz COMPARISON: 21 days ago. FINDINGS: BONES: A right supra-acetabular destructive lesion noted on a prior CT scan is not well visualized on these radiographs. No definite fracture is noted. JOINTS: No dislocation. The joint spaces are normal. SOFT TISSUES: The soft tissues are unremarkable. IMPRESSION: 1. No acute osseous abnormality. 2. Right supra-acetabular destructive lesion seen on prior CT is not well visualized on these radiographs. Electronically signed by: Lynwood Seip MD 05/25/2024 01:59 PM EST RP Workstation: [id number]    Medications: I have reviewed the patient's current medications.  Assessment/Plan: Right renal mass CTs abdomen/pelvis 02/01/2024-mild right-sided hydroureteronephrosis extending to the mid to distal ureter; multiple enlarged retroperitoneal lymph nodes involving the periaortic and aortocaval spaces; ovoid hyperdensity along the lower pole of the right kidney measuring 1.8 cm.   CT renal 02/08/2024-1.6 x 1.7 x 2.0 cm hyperattenuating moderately enhancing mass in the right kidney lower pole calyx with subtle extension of tumor into the anteroinferior wall of the right renal pelvis; moderate right hydronephrosis  and hydroureter up to the level of the right mid ureter; additional linear bandlike enhancing area in the right upper ureter; extensive heterogeneous  retroperitoneal lymphadenopathy.   PET scan 02/11/2024-hypermetabolic left supraclavicular, AP window, retrocrural, right gastric and retroperitoneal adenopathy; solitary hypermetabolic skeletal lesion in the vicinity of the right anterior S1 sacral foramen. Biopsy left supraclavicular lymph node 02/24/2024-metastatic carcinoma compatible with clinical suspicion of metastatic urothelial carcinoma; foundation 1-microsatellite stable, HRDsig negative, tumor mutation burden 1, negative for FGFR3 alteration Cystoscopy/right ureter stent placement 03/01/2024-No intravesical or urethral abnormality seen; solitary right collecting system with no filling defects or dilation involving the right ureter or right renal pelvis; papillary tumor involving the right mid pole calyx with features concerning for urothelial carcinoma. Cycle 1 day 1 pembrolizumab  and enfortumab 03/09/2024; day 8 held 03/16/2024 due to rash and diarrhea CT chest 03/13/2024-negative for PE; progressive adenopathy in the chest and upper abdomen CT lumbar spine 03/13/2024-known S1 metastasis only faintly visualized.  Loss of cortex at the S1 foramen with possible soft tissue component, possibly abutting the subjacent nerve root.  Upper retroperitoneal adenopathy again seen, progressive. 03/16/2024 cycle 1 day 8 enfortumab held due to diarrhea and rash 03/29/2024 cycle 2-day 1 Pembrolizumab /enfortumab, enfortumab dose reduced due to rash and diarrhea following cycle 1 day 1, day 8 enfortumab 04/06/2024 04/20/2024 cycle 3-day 1 pembrolizumab /enfortumab; day 8 held due to failure to thrive CTs 05/04/2024 report reads progression of nodal and osseous metastasis.  Per Dr. Andriette review no significant change in chest lymphadenopathy, mixed response involving retroperitoneal nodes, sacral lesion appears enlarged, new sclerotic lesions in the thoracic spine may be related to scarring from treated metastases. Mutual decision to continue Pembrolizumab  and  refer for second opinion at [facility] pain secondary to #1 Change in bowel habits, postprandial abdominal pain Emphysema Tobacco use Rash and diarrhea following cycle 1 day 1 Pembrolizumab /enfortumab N/V, GERD, low appetite  Disposition: Ann Lee has metastatic urothelial cancer.  She has been treated with Pembrolizumab /enfortumab.  Recent CTs with apparent mixed response.  At her last visit treatment options were discussed including continuing current treatment with short interval restaging versus changing to a different systemic therapy with gemcitabine/cisplatin.  She was referred for a second opinion at [facility].  We are unable to see their recommendations in the computer system.  Family indicates the recommendation is to begin gemcitabine/cisplatin.  We reviewed potential side effects associated with chemotherapy including bone marrow toxicity, nausea, hair loss, allergic reaction.  We reviewed potential side effects associated with Gemcitabine including fever and rash following treatment, pneumonitis.  We discussed potential toxicities associated with cisplatin including nausea/vomiting, nephrotoxicity, peripheral neuropathy, ototoxicity.  She understands the rationale for pre and post hydration.  We discussed an audiology referral for baseline testing, referral placed.  She would like to proceed as above.  She is symptomatic with significant pain at the right sacrum.  We are making a referral for palliative radiation.  She will begin OxyContin  10 mg every 12 hours, continue oxycodone  as needed for breakthrough pain.  For appetite she will begin prednisone  10 mg daily.  We reviewed potential side effects.  We reviewed the labs she had in the emergency department yesterday.  She has mild hypokalemia.  She does not feel she can take oral potassium.  She declines IV replacement.  Plan at present is to begin systemic therapy next week with  gemcitabine/cisplatin.  We will follow-up on the official recommendations from [facility].  She will return for lab and follow-up on 06/01/2024  with the tentative plan for gemcitabine/cisplatin on 06/02/2024.  Plan reviewed with Dr. Cloretta by phone.  Olam Ned ANP/GNP-BC   05/26/2024  9:56 AM

## 2024-05-26 NOTE — Progress Notes (Signed)
 Radiation Oncology         [phone] ________________________________  Name: Ann Lee        MRN: [id number]  Date of Service: 05/29/2024 DOB: [birth year]  RR:Almryzuuz, Wolm ORN, MD  Ann Arley NOVAK, MD     REFERRING PHYSICIAN: Cloretta Arley NOVAK, MD   DIAGNOSIS: C79. 51 for Secondary malignant neoplasm of bone   HISTORY OF PRESENT ILLNESS: Ann Lee is a 68 y.o. female seen in consultation for radiation therapy.  Ann Lee has a hx of high grade urothelial carcinoma. She presented in the summer of 2025 in the setting of progressively worsening back pain, change in bowel habits and bloating. She underwent abdominal x-ray demonstrating a mild stool burden and no abnormal bowel dilatation. Due to worsening sx she underwent an US  of the abdomen demonstrating R pelvocaliectasis containing complex fluid possibly representative of R sided pyelonephritis. US  pelvis and x-ray lumbar spine were unremarkable. She underwent CT abdomen pelvis WO on 02/01/24 which demonstrated R sided hydroureteronephrosis, multiple enlarged retroperitoneal lymph nodes and an ovoid hyperdensity along the lower pole of the R kidney, 1.8 cm, possible representing blood vs neoplasm. Dedicated renal imaging demonstrated a 1.6 x 1.7 x 2.0 cm mass in the R kidney lower pole calyx with extension into the anteroinferior wall of the R pelvis with moderate R hydronephrosis and hydroureter. Given concern for malignancy, PET scan on 02/11/23 revealed metastatic disease involving the L supraclav, AP window, retrocrural and R gastric lymph node levels in addition to known retroperitoneal adenopathy. A solitary skeletal lesion was seen at S1.   Biopsy of the L supraclavicular LN was consistent with metastatic urothelial carcinoma. Renal pelvis washings obtained and consistent with high grade urothelial carcinoma. She was started on treatment with pembrolizumab /enfortumab on 03/09/24 under the care of Dr. Cloretta. Restaging  imaging obtained 05/04/24 unfortunately demonstrated progression of disease with progression of metastatic adenopathy, possible new hepatic metastases and new and enlarging bone lesions including a larger R sacral lytic lesion. Unclear if this progression was during the interval of initial staging scans and initiating treatment or true treatment failure. Generally, she has felt poorly since starting treatment. In that setting she was continued on pembrolizumab  alone while she underwent second opinion at [facility] where the recommendation was for treatment with gem/cis.   She has not yet started this treatment and has had significant worsening in her pain particularly in the R pelvis and flank. They have referred her here  to discuss palliative radiation to these painful sites of disease.  She reports constant, severe, agonizing pain in the lower R back. It radiates down her R leg and she is unable to walk due to the pain. She denies weakness. Her sister said they thought it was sciatic pain. She has pain medicines that aren't very helpful. Has not tried steroids for pain relief although she recently started taking some as premedication prior to a scan. Has never used gabapentin . They are anxious to get her started on chemo but also want her pain under control. She is essentially laying in the bed all day as she's unable to walk and sitting is uncomfortable. She has to lie on her L side.   PREVIOUS RADIATION THERAPY: No  AUTOIMMUNE DISEASE: No  MEDICAL DEVICES: No  PREGNANCY: No   PAST MEDICAL HISTORY:  Past Medical History:  Diagnosis Date   Endometriosis    HERPES ZOSTER 10/18/2008   History of iron deficiency anemia    Hypertension  Bp'a have been running low, holding medication at this time   Migraines    NECK PAIN 02/15/2009   PARESTHESIA 10/30/2009   PERS HX TOBACCO USE PRESENTING HAZARDS HEALTH 03/10/2010   SINUSITIS, ACUTE 05/03/2009   SORE THROAT 02/12/2009    Squamous cell skin cancer    right forearm   TMJ SYNDROME 02/11/2009   Wears dentures        PAST SURGICAL HISTORY: Past Surgical History:  Procedure Laterality Date   CYSTOSCOPY WITH BIOPSY Right 03/01/2024   Procedure: CYSTOSCOPY,;  Surgeon: Devere Lonni Righter, MD;  Location: [facility];  Service: Urology;  Laterality: Right;  CYSTOSCOPY WITH RIGHT URETEROSCOPY, POSSIBLE BIOPSY, RIGHT RETROGRADE PYELOGRAM, STENT PLACEMENT   CYSTOSCOPY WITH URETEROSCOPY AND STENT PLACEMENT Right 03/01/2024   Procedure: CYSTOURETEROSCOPY, WITH STENT INSERTION;  Surgeon: Devere Lonni Righter, MD;  Location: [facility];  Service: Urology;  Laterality: Right;   DILATION AND CURETTAGE OF UTERUS     HERNIA REPAIR     umbilical   IR IMAGING GUIDED PORT INSERTION  03/06/2024   WISDOM TOOTH EXTRACTION       FAMILY HISTORY:  Family History  Problem Relation Age of Onset   Arthritis Mother    Non-Hodgkin's lymphoma Mother    Esophageal cancer Mother    Stroke Mother    Hyperlipidemia Father    Arthritis Father    Colon cancer Father    Lung cancer Sister      SOCIAL HISTORY:  reports that she has been smoking cigarettes. She has a 41 pack-year smoking history. She has never used smokeless tobacco. She reports that she does not drink alcohol and does not use drugs.   ALLERGIES: Shellfish allergy   MEDICATIONS:  Current Outpatient Medications  Medication Sig Dispense Refill   acetaminophen  (TYLENOL ) 325 MG tablet Take 2 tablets (650 mg total) by mouth every 6 (six) hours as needed. 36 tablet 0   cyclobenzaprine  (FLEXERIL ) 5 MG tablet Take 1 tablet (5 mg total) by mouth 2 (two) times daily as needed for muscle spasms. 20 tablet 0   lidocaine -prilocaine  (EMLA ) cream Apply 1 Application topically as needed (Apply to port 1 hour prior to its use, starting with 2nd treatment). 30 g 0   LORazepam  (ATIVAN ) 0.5 MG tablet Take 1 tablet (0.5 mg total) by mouth every 6 (six) hours as needed for anxiety.  (Patient not taking: Reported on 05/26/2024) 30 tablet 0   ondansetron  (ZOFRAN ) 8 MG tablet Take 1 tablet (8 mg total) by mouth every 8 (eight) hours as needed for nausea or vomiting. (Patient not taking: Reported on 05/26/2024) 30 tablet 2   oxyCODONE  (OXYCONTIN ) 10 mg 12 hr tablet Take 1 tablet (10 mg total) by mouth every 12 (twelve) hours. 60 tablet 0   oxyCODONE  (ROXICODONE ) 5 MG immediate release tablet Take 1 tablet (5 mg total) by mouth every 4 (four) hours as needed for severe pain (pain score 7-10). 30 tablet 0   phenazopyridine  (PYRIDIUM ) 200 MG tablet Take 1 tablet (200 mg total) by mouth 3 (three) times daily as needed (for pain with urination). (Patient not taking: Reported on 05/26/2024) 30 tablet 0   predniSONE  (DELTASONE ) 10 MG tablet Take 1 tablet (10 mg total) by mouth daily with breakfast. 30 tablet 0   SUMAtriptan  (IMITREX ) 25 MG tablet Take one at onset of migraine and may repeat one in two hours as needed (max of 2 in 24 hours) (Patient not taking: Reported on 05/26/2024) 10 tablet 3   No current  facility-administered medications for this encounter.   Facility-Administered Medications Ordered in Other Encounters  Medication Dose Route Frequency Provider Last Rate Last Admin   0.9 %  sodium chloride  infusion   Intravenous Continuous Ann Arley NOVAK, MD 10 mL/hr at 03/30/24 0841 New Bag at 03/30/24 0841   prochlorperazine  (COMPAZINE ) tablet 10 mg  10 mg Oral Once Sherrill, Gary B, MD         REVIEW OF SYSTEMS: As per HPI.  PHYSICAL EXAM:  Wt Readings from Last 3 Encounters:  05/26/24 102 lb 3.2 oz (46.4 kg)  05/10/24 104 lb 11.2 oz (47.5 kg)  05/02/24 105 lb 3.2 oz (47.7 kg)   Temp Readings from Last 3 Encounters:  05/26/24 97.8 F (36.6 C) (Temporal)  05/25/24 98 F (36.7 C)  05/10/24 98.1 F (36.7 C) (Temporal)   BP Readings from Last 3 Encounters:  05/26/24 104/61  05/25/24 122/60  05/10/24 110/76   Pulse Readings from Last 3 Encounters:  05/26/24 88   05/25/24 89  05/10/24 97    /10   Physical Exam Vitals and nursing note reviewed.  HENT:     Head: Normocephalic and atraumatic.  Cardiovascular:     Rate and Rhythm: Bradycardia present.  Pulmonary:     Effort: Pulmonary effort is normal. No respiratory distress.  Abdominal:     General: There is no distension.  Musculoskeletal:        General: No deformity.     Cervical back: Normal range of motion.     Comments: No tenderness to palpation of bony spine, some radiating pain when palpating lower R back/buttock area  Neurological:     General: No focal deficit present.     Mental Status: She is alert.     Comments: Laying on exam bed in no acute distress, required wheelchair after completion of visit  Psychiatric:        Mood and Affect: Mood normal.      ECOG = 2   LABORATORY DATA:  Lab Results  Component Value Date   WBC 8.2 05/25/2024   HGB 9.6 (L) 05/25/2024   HCT 30.1 (L) 05/25/2024   MCV 88.3 05/25/2024   PLT 334 05/25/2024   Lab Results  Component Value Date   NA 136 05/25/2024   K 3.3 (L) 05/25/2024   CL 98 05/25/2024   CO2 26 05/25/2024   Lab Results  Component Value Date   ALT <5 05/25/2024   AST 17 05/25/2024   ALKPHOS 117 05/25/2024   BILITOT 0.4 05/25/2024      RADIOGRAPHY: DG Femur Min 2 Views Right Result Date: 05/25/2024 EXAM: XR Right Femur, 2 View. CLINICAL HISTORY: Malignancy, right femur/hip pain. COMPARISON: None provided. FINDINGS: BONES: No acute fracture or focal osseous lesion. JOINTS: No dislocation. The joint spaces are normal. SOFT TISSUES: The soft tissues are unremarkable. IMPRESSION: No acute osseous abnormality. Electronically signed by: Lynwood Seip MD 05/25/2024 01:59 PM EST RP Workstation: [id number]   DG Hip Unilat W or Wo Pelvis 2-3 Views Right Result Date: 05/25/2024 EXAM: XR right hip, 2 or 3 View. CLINICAL HISTORY: right hip pain, metastatic dz COMPARISON: 21 days ago. FINDINGS: BONES: A right supra-acetabular  destructive lesion noted on a prior CT scan is not well visualized on these radiographs. No definite fracture is noted. JOINTS: No dislocation. The joint spaces are normal. SOFT TISSUES: The soft tissues are unremarkable. IMPRESSION: 1. No acute osseous abnormality. 2. Right supra-acetabular destructive lesion seen on prior CT is not well visualized  on these radiographs. Electronically signed by: Lynwood Seip MD 05/25/2024 01:59 PM EST RP Workstation: [id number]   CT CHEST ABDOMEN PELVIS W CONTRAST Result Date: 05/04/2024 EXAM: CT CHEST ABDOMEN PELVIS WITH THORACIC AND LUMBAR SPINE RECONSTRUCTIONS 05/04/2024 01:35:41 PM TECHNIQUE: CT of the chest, abdomen, pelvis was performed after the administration of intravenous contrast. Multiplanar reformatted images are provided for review, including reconstructed images of the thoracic and lumbar spine. Automated exposure control, iterative reconstruction, and/or weight based adjustment of the mA/kV was utilized to reduce the radiation dose to as low as reasonably achievable. COMPARISON: CT 02/08/2024 and PET CT 02/11/2024. CLINICAL HISTORY: Restaging urothelial carcinoma. 13 hour prep for contrast allergy. * Tracking Code: BO * FINDINGS: CT CHEST: THORACIC AORTA: No acute traumatic injury of the aorta. MEDIASTINUM: No acute traumatic injury to the heart or pericardium. The central airways are clear. Prevascular rounded enhancing mediastinal lymph node measures 15 mm compared to 13 mm. Adjacent prevascular 8 mm node on image 25 is new. LUNGS: No effusion or pneumothorax. Several small sub 5 mm peripheral pulmonary nodules are not changed from prior. For example nodule in the left upper lobe on image 48. CHEST WALL: Port in the right chest wall. Cluster of left supraclavicular nodes measure 14 mm compared to 9 mm on comparison PET scan. These nodes were hypermetabolic. CT ABDOMEN AND PELVIS: ABDOMINAL AORTA: No acute traumatic injury of the aorta or iliac arteries.  HEPATOBILIARY: No acute traumatic injury. Several small hypodense lesions along the inferior margin of the right hepatic lobe were not seen on prior . for example hypodense lesion along the falciform ligament measuring 15 mm is also new from prior. SPLEEN: No acute traumatic injury. PANCREAS: No acute traumatic injury. ADRENAL GLANDS: No acute traumatic injury. KIDNEYS: No acute traumatic injury. No hydronephrosis. Interval placement of a stent in the right renal collecting system. The right renal mass is not well appreciated. There is mild pelvicalyceal cyst on the right. GI TRACT: No acute traumatic injury of the bowel. No bowel obstruction. Tip of the appendix is nodular on image 94/2 but unchanged from prior. PERITONEUM: No ascites or free air. RETROPERITONEUM: No retroperitoneal hematoma. Increase in enhancing lymph node left of the aorta at the level of the kidneys measuring 2.3 x 1.7 cm compared to 1.0 x 0.6 cm. Enlarged retrocural node on image 57 Metastatic adenopathy ventral to the aorta similar. No pelvic lymphadenopathy. BLADDER: No acute abnormality. REPRODUCTIVE ORGANS: No acute abnormality. BONES: No acute traumatic fracture of the pelvis. Increase in size of right sacral mass which is lytic measuring 3.4 x 2.9 cm compared to no CTapparent lesion on comparisonPET CT scan. New lytic lesion in the right iliac bone measuring 15 mm on image 89. Multiple new sclerotic lesions in the upper thoracic spine. THORACIC AND LUMBAR SPINE: BONES AND ALIGNMENT: No traumatic fracture or traumatic malalignment. Multiple new sclerotic lesions in the upper thoracic spine. DEGENERATIVE CHANGES: No severe spinal canal stenosis or bony neural foraminal narrowing. SOFT TISSUES: No paraspinal mass or hematoma. IMPRESSION: 1. Progression of metastatic disease with increased left supraclavicular and prevascular mediastinal lymphadenopathy, including a new prevascular node. 2. Interval increase in size of a left retroperitoneal  lymph nodes onsistent with progressive metastatic nodal disease in the abdomen. 3. New low-density lesions along the inferior right hepatic margin and along the falciform ligament, suspicious for new hepatic and/or peritoneal metastases. 4. New and enlarging osseous metastases, including a larger right sacral lytic lesion, a new right iliac lytic lesion, and multiple new  sclerotic lesions in the thoracic spine, consistent with progression of skeletal metastases. 5. Right ureteral stent in place with mild right pelvicalyceal dilatation. Electronically signed by: Norleen Boxer MD 05/04/2024 05:10 PM EST RP Workstation: [id number]     PATHOLOGY: no pertinent pathology     IMPRESSION/PLAN:  1. Bone metastasis:    - Shantinique Picazo is a 68 y.o. female with symptomatic metastatic involvement of the sacrum/pevlis    - Orthopedic or neurosurgical consultation not indicated at this time.    - We reviewed the role of palliative radiation therapy for local control, pain relief, and prevention of morbidity.     - Discussed standard fractionation schedules (e.g., 8 Gy  1, 20 Gy  5, 30 Gy  10, or SBRT for selected cases).    - Our recommendation today is 30 Gy in 10 fractions.    - Patient wishes to proceed.    - Simulation scheduled for today, plan to start treatment Wednesday  2. Systemic therapy:    - Coordinated care with medical oncology.     - Current systemic therapy: plan to start gem/cis on 06/02/24, will reach out to ensure no overlap with infusion day and radiation     - Patient will continue systemic management per med onc.  3. Pain management:    - Current regimen: oxycontin  10 mg q12 hr, oxycodone  5 mg q4 hrs for breakthrough pain.    - Will add medrol  dose pak and gabapentin   Total time spent today in preparation for this visit was 60 minutes. This included patient care, imaging and path review, documentation, multidisciplinary discussion and coordination of care and follow  up.    Estefana HERO. Maritza, M.D.

## 2024-05-26 NOTE — Progress Notes (Incomplete)
 Histology and Location of Primary Cancer: Urothelial carcinoma of kidney, right   Sites of Visceral and Bony Metastatic Disease: Right sacrum   Location(s) of Symptomatic Metastases:   Past/Anticipated chemotherapy by medical oncology, if any:   Pain on a scale of 0-10 is: 7 Back and right leg   If Spine Met(s), symptoms, if any, include: Bowel/Bladder retention or incontinence (please describe): Denies Numbness or weakness in extremities (please describe): Denies Current Decadron  regimen, if applicable:   Ambulatory status? Walker? Wheelchair?: Ambulatory  SAFETY ISSUES: Prior radiation? No Pacemaker/ICD? No Possible current pregnancy? No Is the patient on methotrexate? No  Current Complaints / other details:  None at this time. Simulation scheduled for Wednesday.   BP Readings from Last 3 Encounters:  05/26/24 104/61  05/25/24 122/60  05/10/24 110/76     BP (!) 88/54 (BP Location: Right Arm, Patient Position: Supine, Cuff Size: Normal)   Pulse (!) 106   Temp 97.8 F (36.6 C)   Resp 20   Ht 5' 3 (1.6 m)   Wt 101 lb 6.4 oz (46 kg)   SpO2 99%   BMI 17.96 kg/m

## 2024-05-29 ENCOUNTER — Encounter: Payer: Self-pay | Admitting: Radiation Oncology

## 2024-05-29 ENCOUNTER — Ambulatory Visit: Admission: RE | Admit: 2024-05-29 | Discharge: 2024-05-29 | Attending: Radiation Oncology

## 2024-05-29 ENCOUNTER — Ambulatory Visit
Admission: RE | Admit: 2024-05-29 | Discharge: 2024-05-29 | Disposition: A | Source: Ambulatory Visit | Attending: Radiation Oncology | Admitting: Radiation Oncology

## 2024-05-29 ENCOUNTER — Inpatient Hospital Stay: Admitting: Nutrition

## 2024-05-29 VITALS — BP 88/54 | HR 106 | Temp 97.8°F | Resp 20 | Ht 63.0 in | Wt 101.4 lb

## 2024-05-29 DIAGNOSIS — C641 Malignant neoplasm of right kidney, except renal pelvis: Secondary | ICD-10-CM | POA: Diagnosis not present

## 2024-05-29 DIAGNOSIS — C7951 Secondary malignant neoplasm of bone: Secondary | ICD-10-CM | POA: Insufficient documentation

## 2024-05-29 DIAGNOSIS — N1330 Unspecified hydronephrosis: Secondary | ICD-10-CM | POA: Diagnosis not present

## 2024-05-29 DIAGNOSIS — Z510 Encounter for antineoplastic radiation therapy: Secondary | ICD-10-CM | POA: Insufficient documentation

## 2024-05-29 DIAGNOSIS — C679 Malignant neoplasm of bladder, unspecified: Secondary | ICD-10-CM | POA: Diagnosis not present

## 2024-05-29 DIAGNOSIS — Z801 Family history of malignant neoplasm of trachea, bronchus and lung: Secondary | ICD-10-CM | POA: Diagnosis not present

## 2024-05-29 DIAGNOSIS — B029 Zoster without complications: Secondary | ICD-10-CM | POA: Diagnosis not present

## 2024-05-29 DIAGNOSIS — Z7952 Long term (current) use of systemic steroids: Secondary | ICD-10-CM | POA: Diagnosis not present

## 2024-05-29 DIAGNOSIS — Z85828 Personal history of other malignant neoplasm of skin: Secondary | ICD-10-CM | POA: Diagnosis not present

## 2024-05-29 DIAGNOSIS — Z800 Family history of malignant neoplasm of digestive organs: Secondary | ICD-10-CM | POA: Diagnosis not present

## 2024-05-29 DIAGNOSIS — F17210 Nicotine dependence, cigarettes, uncomplicated: Secondary | ICD-10-CM | POA: Diagnosis not present

## 2024-05-29 DIAGNOSIS — N134 Hydroureter: Secondary | ICD-10-CM | POA: Diagnosis not present

## 2024-05-29 MED ORDER — METHYLPREDNISOLONE 4 MG PO TBPK: ORAL_TABLET | ORAL | 0 refills | Status: AC

## 2024-05-29 MED ORDER — METHYLPREDNISOLONE 4 MG PO TBPK: ORAL_TABLET | ORAL | 0 refills | Status: DC

## 2024-05-29 MED ORDER — GABAPENTIN 400 MG PO CAPS: 400.0000 mg | ORAL_CAPSULE | Freq: Three times a day (TID) | ORAL | 0 refills | Status: AC

## 2024-05-29 NOTE — Progress Notes (Signed)
 68 year old female diagnosed with Metastatic Urothelial carcinoma and followed by Dr. Cloretta. Plan is for ENFORTUMAB D1, D8 + PEMBROLIZUMAB  (200) D1 Q21D   Radiation planned for bone lesions. 30 Gy in 10 fractions   PMH includes Tobacco, nausea and vomiting, GERD  Medications include Prednisone   Labs include potassium 3.3 and Albumin 2.7  Height: 63 inches Weight: 101 pounds 6.4 oz. December 15 115.8 pounds October 23 Reports usual weight of 134 pounds. BMI: 17.96 (underweight)  13% weight loss in less than 3 months/clinically significant  Pain is significant in lower back. She lays in bed all day and is unable to walk or sit comfortably. Anxious for radiation to begin. Feels like she is getting constipation and is going to try Senokot. Her appetite is much better since she hasn't had chemotherapy recently and she is eating a lot. Denies nausea and vomiting. She doesn't like dairy foods and has not liked ONS such as boost or ensure. Would be interested in trying a protein powder to make her own smoothies.  Nutrition Diagnosis: Unintended weight loss related to cancer and associated treatments as evidenced by 11% loss in less than 2 months which is clinically significant.  Intervention: Educated to eat small, frequent meals and snacks throughout the day. Increase protein foods. Provided information on foods with protein. Encouraged an unflavored protein powder to mix with juice and or fruits. Recommended ordering from pharmacy if she can't find something on the shelf. Encouraged increased fluids and bowel regimen Support and encouragement given. Encouraged patient to call with questions or concerns.  Monitoring, Evaluation, Goals: Tolerate increased calories and protein to minimize weight loss.  Next Visit: To be scheduled as needed

## 2024-05-30 ENCOUNTER — Other Ambulatory Visit: Payer: Self-pay

## 2024-05-30 ENCOUNTER — Encounter: Payer: Self-pay | Admitting: *Deleted

## 2024-05-30 ENCOUNTER — Telehealth: Payer: Self-pay | Admitting: *Deleted

## 2024-05-30 ENCOUNTER — Encounter: Payer: Self-pay | Admitting: Oncology

## 2024-05-30 DIAGNOSIS — Z510 Encounter for antineoplastic radiation therapy: Secondary | ICD-10-CM | POA: Diagnosis not present

## 2024-05-30 NOTE — Progress Notes (Unsigned)
 Notified by Ann Lee w/[employer] reporting they have denied her oxycontin  10 mg every 12 hours medication. Will be sending reason for denial and how to appeal via fax. If need to call, can be reached at [phone] option 5.

## 2024-05-30 NOTE — Progress Notes (Signed)
 Her2 testing requested on accession number [id number]

## 2024-05-30 NOTE — Telephone Encounter (Signed)
 Ann Lee called to report she is not able to pick up the oxycontin  due to insurance denial. Informed her that company is sending denial reason via fax and we will work on it. She states it has already been denied x 2.  She had planning films on 12/15 with Dr. Maritza in [place] and a tumor was found on her hip and MD told her she should not have chemo while having RT. Will forward this information to Dr. Cloretta.

## 2024-05-31 ENCOUNTER — Ambulatory Visit

## 2024-05-31 ENCOUNTER — Ambulatory Visit
Admission: RE | Admit: 2024-05-31 | Discharge: 2024-05-31 | Attending: Radiation Oncology | Admitting: Radiation Oncology

## 2024-05-31 ENCOUNTER — Ambulatory Visit
Admission: RE | Admit: 2024-05-31 | Discharge: 2024-05-31 | Disposition: A | Discharge status: Home / Self Care | Source: Ambulatory Visit | Attending: Radiation Oncology | Admitting: Radiation Oncology

## 2024-05-31 ENCOUNTER — Ambulatory Visit: Admitting: Audiologist

## 2024-05-31 ENCOUNTER — Other Ambulatory Visit: Payer: Self-pay

## 2024-05-31 ENCOUNTER — Telehealth: Payer: Self-pay | Admitting: *Deleted

## 2024-05-31 DIAGNOSIS — Z510 Encounter for antineoplastic radiation therapy: Secondary | ICD-10-CM | POA: Diagnosis not present

## 2024-05-31 LAB — RAD ONC ARIA SESSION SUMMARY
Course Elapsed Days: 0
Plan Fractions Treated to Date: 1
Plan Prescribed Dose Per Fraction: 4 Gy
Plan Total Fractions Prescribed: 5
Plan Total Prescribed Dose: 20 Gy
Reference Point Dosage Given to Date: 4 Gy
Reference Point Session Dosage Given: 4 Gy
Session Number: 1

## 2024-05-31 NOTE — Telephone Encounter (Signed)
 Called [employer] to f/u on denial of Oxycontin  10 mg script. Was told by pharmacy that she picked up script on 12/13, but on ly received #7 day supply. Requested denial letter to be faxed to office as soon as possible and [place] has put in request. Per Covermymeds it appears that insurance company prefers an alternative, but denial letter should have that information. Phone # for appeals if needed is [phone] option 1.

## 2024-06-01 ENCOUNTER — Ambulatory Visit: Admission: RE | Admit: 2024-06-01

## 2024-06-01 ENCOUNTER — Inpatient Hospital Stay

## 2024-06-01 ENCOUNTER — Inpatient Hospital Stay (HOSPITAL_BASED_OUTPATIENT_CLINIC_OR_DEPARTMENT_OTHER): Admitting: Nurse Practitioner

## 2024-06-01 ENCOUNTER — Ambulatory Visit

## 2024-06-01 ENCOUNTER — Inpatient Hospital Stay: Admitting: Nurse Practitioner

## 2024-06-01 ENCOUNTER — Other Ambulatory Visit: Payer: Self-pay

## 2024-06-01 VITALS — BP 117/71 | HR 64 | Temp 98.0°F | Resp 15 | Wt 99.7 lb

## 2024-06-01 DIAGNOSIS — C641 Malignant neoplasm of right kidney, except renal pelvis: Secondary | ICD-10-CM

## 2024-06-01 DIAGNOSIS — C651 Malignant neoplasm of right renal pelvis: Secondary | ICD-10-CM | POA: Diagnosis not present

## 2024-06-01 DIAGNOSIS — Z510 Encounter for antineoplastic radiation therapy: Secondary | ICD-10-CM | POA: Diagnosis not present

## 2024-06-01 LAB — CBC WITH DIFFERENTIAL/PLATELET
Abs Immature Granulocytes: 0.09 K/uL — ABNORMAL HIGH (ref 0.00–0.07)
Basophils Absolute: 0 K/uL (ref 0.0–0.1)
Basophils Relative: 0 %
Eosinophils Absolute: 1 K/uL — ABNORMAL HIGH (ref 0.0–0.5)
Eosinophils Relative: 8 %
HCT: 33.2 % — ABNORMAL LOW (ref 36.0–46.0)
Hemoglobin: 10.8 g/dL — ABNORMAL LOW (ref 12.0–15.0)
Immature Granulocytes: 1 %
Lymphocytes Relative: 4 %
Lymphs Abs: 0.6 K/uL — ABNORMAL LOW (ref 0.7–4.0)
MCH: 28.1 pg (ref 26.0–34.0)
MCHC: 32.5 g/dL (ref 30.0–36.0)
MCV: 86.5 fL (ref 80.0–100.0)
Monocytes Absolute: 0.9 K/uL (ref 0.1–1.0)
Monocytes Relative: 7 %
Neutro Abs: 10.4 K/uL — ABNORMAL HIGH (ref 1.7–7.7)
Neutrophils Relative %: 80 %
Platelets: 323 K/uL (ref 150–400)
RBC: 3.84 MIL/uL — ABNORMAL LOW (ref 3.87–5.11)
RDW: 16 % — ABNORMAL HIGH (ref 11.5–15.5)
WBC: 13 K/uL — ABNORMAL HIGH (ref 4.0–10.5)
nRBC: 0 % (ref 0.0–0.2)

## 2024-06-01 LAB — RAD ONC ARIA SESSION SUMMARY
Course Elapsed Days: 1
Plan Fractions Treated to Date: 2
Plan Prescribed Dose Per Fraction: 4 Gy
Plan Total Fractions Prescribed: 5
Plan Total Prescribed Dose: 20 Gy
Reference Point Dosage Given to Date: 8 Gy
Reference Point Session Dosage Given: 4 Gy
Session Number: 2

## 2024-06-01 LAB — COMPREHENSIVE METABOLIC PANEL WITH GFR
ALT: 5 U/L (ref 0–44)
AST: 12 U/L — ABNORMAL LOW (ref 15–41)
Albumin: 3 g/dL — ABNORMAL LOW (ref 3.5–5.0)
Alkaline Phosphatase: 111 U/L (ref 38–126)
Anion gap: 9 (ref 5–15)
BUN: 12 mg/dL (ref 8–23)
CO2: 29 mmol/L (ref 22–32)
Calcium: 9.6 mg/dL (ref 8.9–10.3)
Chloride: 97 mmol/L — ABNORMAL LOW (ref 98–111)
Creatinine, Ser: 0.58 mg/dL (ref 0.44–1.00)
GFR, Estimated: >60 mL/min (ref >60)
Glucose, Bld: 118 mg/dL — ABNORMAL HIGH (ref 70–99)
Potassium: 3.3 mmol/L — ABNORMAL LOW (ref 3.5–5.1)
Sodium: 135 mmol/L (ref 135–145)
Total Bilirubin: 0.4 mg/dL (ref 0.0–1.2)
Total Protein: 5.8 g/dL — ABNORMAL LOW (ref 6.5–8.1)

## 2024-06-01 NOTE — Progress Notes (Signed)
 DISCONTINUE OFF PATHWAY REGIMEN - Other   [id number]:Enfortumab vedotin  1.25 mg/kg IV D1,8 + Pembrolizumab  200 mg IV D1 q21 Days:   A cycle is every 21 days:     Enfortumab vedotin -ejfv      Pembrolizumab    **Always confirm dose/schedule in your pharmacy ordering system**  PRIOR TREATMENT: Enfortumab vedotin  1.25 mg/kg IV D1,8 + Pembrolizumab  200 mg IV D1 q21 Days  START OFF PATHWAY REGIMEN - Other   [id number]:Cisplatin 35 mg/m2 IV D1,8 + Gemcitabine 1,000 mg/m2 IV D1,8 q21 Days:   A cycle is every 21 days:     Gemcitabine      Cisplatin   **Always confirm dose/schedule in your pharmacy ordering system**  Patient Characteristics: Intent of Therapy: Non-Curative / Palliative Intent, Discussed with Patient

## 2024-06-01 NOTE — Progress Notes (Signed)
 [facility]   [phone] [phone]) [phone]    Patient Care Team: Micheal Wolm ORN, MD as PCP - General O'Kelley, Sari SQUIBB, RN as Oncology Nurse Navigator   CHIEF COMPLAINT: Follow-up urothelial cancer  CURRENT THERAPY: Pembrolizumab , pending change to next line Cis/gem; palliative radiation   INTERVAL HISTORY Ms. Fahey presents for follow-up, accompanied by her sister, lying on exam table.  Pain in the right hip/SI and spine is currently 2-3 at rest but jumps up to 10 out of 10 with movement.  She has required oxycodone  every 3 0.5 to 4 hours more recently, sometimes can go 6 to 8 hours. Started medrol  dose pack and Dr. Maritza prescribed gabapentin .  Was constipated but had a bowel movement yesterday.  Denies nausea/vomiting, she is eating but continues to lose weight.  She has exertional dyspnea but no cough, chest pain, fever or chills.  She has been resting in the bed more than half the day for the last few days.  Began radiation yesterday.  Plans to remain local at this time.    ROS  All other systems reviewed and negative  Past Medical History:  Diagnosis Date   Endometriosis    HERPES ZOSTER 10/18/2008   History of iron deficiency anemia    Hypertension    Bp'a have been running low, holding medication at this time   Migraines    NECK PAIN 02/15/2009   PARESTHESIA 10/30/2009   PERS HX TOBACCO USE PRESENTING HAZARDS HEALTH 03/10/2010   SINUSITIS, ACUTE 05/03/2009   SORE THROAT 02/12/2009   Squamous cell skin cancer    right forearm   TMJ SYNDROME 02/11/2009   Wears dentures      Past Surgical History:  Procedure Laterality Date   CYSTOSCOPY WITH BIOPSY Right 03/01/2024   Procedure: CYSTOSCOPY,;  Surgeon: Devere Lonni Righter, MD;  Location: [facility];  Service: Urology;  Laterality: Right;  CYSTOSCOPY WITH RIGHT URETEROSCOPY, POSSIBLE BIOPSY, RIGHT RETROGRADE PYELOGRAM, STENT PLACEMENT   CYSTOSCOPY WITH URETEROSCOPY AND STENT  PLACEMENT Right 03/01/2024   Procedure: CYSTOURETEROSCOPY, WITH STENT INSERTION;  Surgeon: Devere Lonni Righter, MD;  Location: [facility];  Service: Urology;  Laterality: Right;   DILATION AND CURETTAGE OF UTERUS     HERNIA REPAIR     umbilical   IR IMAGING GUIDED PORT INSERTION  03/06/2024   WISDOM TOOTH EXTRACTION       Outpatient Encounter Medications as of 06/01/2024  Medication Sig   acetaminophen  (TYLENOL ) 325 MG tablet Take 2 tablets (650 mg total) by mouth every 6 (six) hours as needed.   cyclobenzaprine  (FLEXERIL ) 5 MG tablet Take 1 tablet (5 mg total) by mouth 2 (two) times daily as needed for muscle spasms.   gabapentin  (NEURONTIN ) 400 MG capsule Take 1 capsule (400 mg total) by mouth 3 (three) times daily.   lidocaine -prilocaine  (EMLA ) cream Apply 1 Application topically as needed (Apply to port 1 hour prior to its use, starting with 2nd treatment).   methylPREDNISolone  (MEDROL  DOSEPAK) 4 MG TBPK tablet Take 6 tablets (24 mg total) by mouth daily for 1 day, THEN 5 tablets (20 mg total) daily for 1 day, THEN 4 tablets (16 mg total) daily for 1 day, THEN 3 tablets (12 mg total) daily for 1 day, THEN 2 tablets (8 mg total) daily for 1 day, THEN 1 tablet (4 mg total) daily for 1 day.   oxyCODONE  (ROXICODONE ) 5 MG immediate release tablet Take 1 tablet (5 mg total) by mouth every  4 (four) hours as needed for severe pain (pain score 7-10).   LORazepam  (ATIVAN ) 0.5 MG tablet Take 1 tablet (0.5 mg total) by mouth every 6 (six) hours as needed for anxiety. (Patient not taking: Reported on 06/01/2024)   ondansetron  (ZOFRAN ) 8 MG tablet Take 1 tablet (8 mg total) by mouth every 8 (eight) hours as needed for nausea or vomiting. (Patient not taking: Reported on 06/01/2024)   oxyCODONE  (OXYCONTIN ) 10 mg 12 hr tablet Take 1 tablet (10 mg total) by mouth every 12 (twelve) hours. (Patient not taking: Reported on 06/01/2024)   phenazopyridine  (PYRIDIUM ) 200 MG tablet Take 1 tablet (200 mg total) by  mouth 3 (three) times daily as needed (for pain with urination). (Patient not taking: Reported on 06/01/2024)   predniSONE  (DELTASONE ) 10 MG tablet Take 1 tablet (10 mg total) by mouth daily with breakfast. (Patient not taking: Reported on 06/01/2024)   SUMAtriptan  (IMITREX ) 25 MG tablet Take one at onset of migraine and may repeat one in two hours as needed (max of 2 in 24 hours) (Patient not taking: Reported on 06/01/2024)   Facility-Administered Encounter Medications as of 06/01/2024  Medication   0.9 %  sodium chloride  infusion   prochlorperazine  (COMPAZINE ) tablet 10 mg     Today's Vitals   06/01/24 0955 06/01/24 1027  BP: 117/71   Pulse: 64   Resp: 15   Temp: 98 F (36.7 C)   TempSrc: Temporal   SpO2: 100%   Weight: 99 lb 11.2 oz (45.2 kg)   PainSc:  10-Worst pain ever   Body mass index is 17.66 kg/m.   ECOG PERFORMANCE STATUS: 3 - Symptomatic, >50% confined to bed  PHYSICAL EXAM GENERAL:alert, no distress and comfortable SKIN: no rash  EYES: sclera clear LUNGS: clear anteriorly with normal breathing effort HEART: regular rate & rhythm, no lower extremity edema ABDOMEN: abdomen soft, non-tender and normal bowel sounds NEURO: alert & oriented x 3 with fluent speech PAC without erythema    CBC    Latest Ref Rng & Units 06/01/2024    9:50 AM 05/25/2024   12:03 PM 04/27/2024    8:45 AM  CBC  WBC 4.0 - 10.5 K/uL 13.0  8.2  7.7   Hemoglobin 12.0 - 15.0 g/dL 89.1  9.6  88.7   Hematocrit 36.0 - 46.0 % 33.2  30.1  35.0   Platelets 150 - 400 K/uL 323  334  335       CMP     Latest Ref Rng & Units 06/01/2024    9:50 AM 05/25/2024   12:03 PM 04/27/2024    8:45 AM  CMP  Glucose 70 - 99 mg/dL 881  97  90   BUN 8 - 23 mg/dL 12  12  10    Creatinine 0.44 - 1.00 mg/dL 9.41  9.53  9.42   Sodium 135 - 145 mmol/L 135  136  139   Potassium 3.5 - 5.1 mmol/L 3.3  3.3  3.2   Chloride 98 - 111 mmol/L 97  98  101   CO2 22 - 32 mmol/L 29  26  25    Calcium 8.9 - 10.3 mg/dL  9.6  9.0  9.4   Total Protein 6.5 - 8.1 g/dL 5.8  5.4  6.1   Total Bilirubin 0.0 - 1.2 mg/dL 0.4  0.4  0.5   Alkaline Phos 38 - 126 U/L 111  117  93   AST 15 - 41 U/L 12  17  14  ALT 0 - 44 U/L 5  <5  6       ASSESSMENT & PLAN: Right renal mass CTs abdomen/pelvis 02/01/2024-mild right-sided hydroureteronephrosis extending to the mid to distal ureter; multiple enlarged retroperitoneal lymph nodes involving the periaortic and aortocaval spaces; ovoid hyperdensity along the lower pole of the right kidney measuring 1.8 cm.   CT renal 02/08/2024-1.6 x 1.7 x 2.0 cm hyperattenuating moderately enhancing mass in the right kidney lower pole calyx with subtle extension of tumor into the anteroinferior wall of the right renal pelvis; moderate right hydronephrosis and hydroureter up to the level of the right mid ureter; additional linear bandlike enhancing area in the right upper ureter; extensive heterogeneous retroperitoneal lymphadenopathy.   PET scan 02/11/2024-hypermetabolic left supraclavicular, AP window, retrocrural, right gastric and retroperitoneal adenopathy; solitary hypermetabolic skeletal lesion in the vicinity of the right anterior S1 sacral foramen. Biopsy left supraclavicular lymph node 02/24/2024-metastatic carcinoma compatible with clinical suspicion of metastatic urothelial carcinoma; foundation 1-microsatellite stable, HRDsig negative, tumor mutation burden 1, negative for FGFR3 alteration Cystoscopy/right ureter stent placement 03/01/2024-No intravesical or urethral abnormality seen; solitary right collecting system with no filling defects or dilation involving the right ureter or right renal pelvis; papillary tumor involving the right mid pole calyx with features concerning for urothelial carcinoma. Cycle 1 day 1 pembrolizumab  and enfortumab 03/09/2024; day 8 held 03/16/2024 due to rash and diarrhea CT chest 03/13/2024-negative for PE; progressive adenopathy in the chest and upper abdomen CT  lumbar spine 03/13/2024-known S1 metastasis only faintly visualized.  Loss of cortex at the S1 foramen with possible soft tissue component, possibly abutting the subjacent nerve root.  Upper retroperitoneal adenopathy again seen, progressive. 03/16/2024 cycle 1 day 8 enfortumab held due to diarrhea and rash 03/29/2024 cycle 2-day 1 Pembrolizumab /enfortumab, enfortumab dose reduced due to rash and diarrhea following cycle 1 day 1, day 8 enfortumab 04/06/2024 04/20/2024 cycle 3-day 1 pembrolizumab /enfortumab; day 8 held due to failure to thrive CTs 05/04/2024 report reads progression of nodal and osseous metastasis.  Per Dr. Andriette review no significant change in chest lymphadenopathy, mixed response involving retroperitoneal nodes, sacral lesion appears enlarged, new sclerotic lesions in the thoracic spine may be related to scarring from treated metastases. Mutual decision to continue Pembrolizumab  and refer for second opinion at [facility] Recommend change to second split dose line cisplatin/gemcitabine day 1, 8 q21 days starting ~06/05/24 Back pain secondary to #1 Palliative radiation to R pelvis/SI per Dr. Maritza 12/17 - 06/05/24 Oxycodone , medrol  dose pack, gabapentin , and to pick up oxycontin  today Change in bowel habits, postprandial abdominal pain Emphysema Tobacco use Rash and diarrhea following cycle 1 day 1 Pembrolizumab /enfortumab N/V, GERD, low appetite  Disposition:  Ms. Strubel appears weak and frail but stable. Currently day 2 of 5 palliative radiation. She has persistent pain in the right sacrum/hip. Oxycontin  has been pending insurance approval, she plans to pay out of pocket today. Continue oxycodone , medrol  dose pack, and gabapentin . She understands to change positions to avoid pressure sores.   Patient seen with Dr. Cloretta, reviewed notes from [facility]. The recommendation is to change to second line split dose gemcitabine/cisplatin on days 1, 8 q21 days.   Potential risk/benefit and side effects including but not limited to hematologic toxicities, rash, pneumonitis, fever, nausea/vomiting, renal dysfunction, and neuropathy/ototoxicity were discussed.  She agrees to proceed.  She understands the goal of care is palliative.  Labs reviewed. Discussed diet/nutrition and PS improvement strategies.   Follow up week of 12/19 with cycle 1 day 1.  Stacie Templin K Doral Digangi, NP 06/01/2024 12:26 PM   This was a shared visit with Armida Silversmith.  Ms. Fedorchak was interviewed and examined.  She has clinical and radiologic evidence of progressive metastatic urothelial carcinoma.  She was seen in consultation at [facility]  Treatment with split dose gemcitabine/cisplatin is recommended.  We reviewed potential toxicities associated with the gemcitabine/cisplatin regimen including the chance of hematologic toxicity, fever, rash, pneumonitis, neuropathy, ototoxicity, nausea, and renal toxicity.  She agrees to proceed.  She has pain at the low back/pelvis secondary to lytic lesions involving the sacrum and iliac.  She is completing a course of palliative radiation.  We adjusted the narcotic pain regimen today.  A treatment plan was entered today.  She will be scheduled for an office visit 06/12/2024 with the plan to begin gemcitabine/cisplatin on 06/13/2024.  Arvella Hof, MD

## 2024-06-02 ENCOUNTER — Inpatient Hospital Stay

## 2024-06-02 ENCOUNTER — Ambulatory Visit

## 2024-06-02 ENCOUNTER — Encounter: Payer: Self-pay | Admitting: *Deleted

## 2024-06-02 ENCOUNTER — Other Ambulatory Visit: Payer: Self-pay

## 2024-06-02 DIAGNOSIS — Z510 Encounter for antineoplastic radiation therapy: Secondary | ICD-10-CM | POA: Diagnosis not present

## 2024-06-02 LAB — RAD ONC ARIA SESSION SUMMARY
Course Elapsed Days: 2
Plan Fractions Treated to Date: 3
Plan Prescribed Dose Per Fraction: 4 Gy
Plan Total Fractions Prescribed: 5
Plan Total Prescribed Dose: 20 Gy
Reference Point Dosage Given to Date: 12 Gy
Reference Point Session Dosage Given: 4 Gy
Session Number: 3

## 2024-06-02 LAB — CYTOLOGY - NON PAP

## 2024-06-02 NOTE — Telephone Encounter (Signed)
 PT called with concerns about upcoming appt, would like PF pushed closer to appt time and requested a bed for her txt day. Sent message to Nurses.

## 2024-06-02 NOTE — Progress Notes (Signed)
 Pharmacist Chemotherapy Monitoring - Initial Assessment    Anticipated start date: 06/13/25   The following has been reviewed per standard work regarding the patient's treatment regimen: The patient's diagnosis, treatment plan and drug doses, and organ/hematologic function Lab orders and baseline tests specific to treatment regimen  The treatment plan start date, drug sequencing, and pre-medications Prior authorization status  Patient's documented medication list, including drug-drug interaction screen and prescriptions for anti-emetics and supportive care specific to the treatment regimen The drug concentrations, fluid compatibility, administration routes, and timing of the medications to be used The patient's access for treatment and lifetime cumulative dose history, if applicable  The patient's medication allergies and previous infusion related reactions, if applicable   Changes made to treatment plan:  N/A  Follow up needed:  Pending authorization for treatment    Ann Lee, [facility], 06/02/2024  3:35 PM

## 2024-06-02 NOTE — Progress Notes (Signed)
 Completed appeal form for Oxycontin  10 mg faxed to [facility] [phone] att: Alexis Devaughn. Sent at 0830 today.

## 2024-06-03 DIAGNOSIS — C689 Malignant neoplasm of urinary organ, unspecified: Secondary | ICD-10-CM | POA: Diagnosis not present

## 2024-06-03 DIAGNOSIS — Z0189 Encounter for other specified special examinations: Secondary | ICD-10-CM | POA: Diagnosis not present

## 2024-06-04 ENCOUNTER — Other Ambulatory Visit: Payer: Self-pay

## 2024-06-04 ENCOUNTER — Ambulatory Visit

## 2024-06-04 DIAGNOSIS — Z510 Encounter for antineoplastic radiation therapy: Secondary | ICD-10-CM | POA: Diagnosis not present

## 2024-06-04 LAB — RAD ONC ARIA SESSION SUMMARY
Course Elapsed Days: 4
Plan Fractions Treated to Date: 4
Plan Prescribed Dose Per Fraction: 4 Gy
Plan Total Fractions Prescribed: 5
Plan Total Prescribed Dose: 20 Gy
Reference Point Dosage Given to Date: 16 Gy
Reference Point Session Dosage Given: 4 Gy
Session Number: 4

## 2024-06-05 ENCOUNTER — Other Ambulatory Visit: Payer: Self-pay

## 2024-06-05 ENCOUNTER — Ambulatory Visit: Admission: RE | Admit: 2024-06-05 | Discharge: 2024-06-05 | Attending: Radiation Oncology

## 2024-06-05 DIAGNOSIS — Z510 Encounter for antineoplastic radiation therapy: Secondary | ICD-10-CM | POA: Diagnosis not present

## 2024-06-05 LAB — RAD ONC ARIA SESSION SUMMARY
Course Elapsed Days: 5
Plan Fractions Treated to Date: 5
Plan Prescribed Dose Per Fraction: 4 Gy
Plan Total Fractions Prescribed: 5
Plan Total Prescribed Dose: 20 Gy
Reference Point Dosage Given to Date: 20 Gy
Reference Point Session Dosage Given: 4 Gy
Session Number: 5

## 2024-06-06 ENCOUNTER — Inpatient Hospital Stay

## 2024-06-06 ENCOUNTER — Other Ambulatory Visit: Payer: Self-pay

## 2024-06-06 ENCOUNTER — Inpatient Hospital Stay: Admitting: Oncology

## 2024-06-06 ENCOUNTER — Ambulatory Visit

## 2024-06-06 NOTE — Radiation Completion Notes (Signed)
 Patient Name: Ann Lee, Ann Lee MRN: [id number] Date of Birth: 04-16-1956 Referring Physician: ARLEY HOF, M.D. Date of Service: 2024-06-06 Radiation Oncologist: Lynwood Cedar, M.D. [facility]                             RADIATION ONCOLOGY END OF TREATMENT NOTE     Diagnosis: C79.51 Secondary malignant neoplasm of bone Staging on 2024-03-02: Urothelial carcinoma of kidney, right (HCC) T=cT1a, N=cN1, M=pM1 Intent: Palliative     ==========DELIVERED PLANS==========  First Treatment Date: 2024-05-31 Last Treatment Date: 2024-06-05   Plan Name: Pelvis_R Site: Sacro-Iliac, Right Technique: 3D Mode: Photon Dose Per Fraction: 4 Gy Prescribed Dose (Delivered / Prescribed): 20 Gy / 20 Gy Prescribed Fxs (Delivered / Prescribed): 5 / 5     ==========ON TREATMENT VISIT DATES========== 2024-05-31     ==========UPCOMING VISITS========== 06/24/2024 [facility] ON[facility] INJECTION 30 [facility] FLUSH  06/22/2024 [facility] INFUSION 7HR (420) [facility] INFUSION  06/22/2024 [facility]-AUDIOLOGY ADULT AUDIOGRAM (AAD) Helane Darryle LABOR, AUD  06/21/2024 [facility] EST PT 30 Debby Olam POUR, NP  06/21/2024 [facility] PORT FLUSH W/LAB [facility] INFUSION  06/13/2024 [facility] INFUSION 6HR45MIN (405) [facility] INFUSION  06/12/2024 [facility] PORT FLUSH W/LAB [facility] INFUSION  06/12/2024 [facility] EST PT 20 Hof Arley NOVAK, MD        ==========APPENDIX - ON TREATMENT VISIT NOTES==========   See weekly On Treatment Notes in Epic for details in the Media tab (listed as Progress notes on the On Treatment Visit Dates listed above).

## 2024-06-07 ENCOUNTER — Ambulatory Visit

## 2024-06-09 ENCOUNTER — Other Ambulatory Visit: Payer: Self-pay

## 2024-06-09 ENCOUNTER — Emergency Department (HOSPITAL_COMMUNITY)

## 2024-06-09 ENCOUNTER — Emergency Department (HOSPITAL_COMMUNITY)
Admission: EM | Admit: 2024-06-09 | Discharge: 2024-06-10 | Disposition: A | Discharge status: Home / Self Care | Attending: Emergency Medicine | Admitting: Emergency Medicine

## 2024-06-09 DIAGNOSIS — M25551 Pain in right hip: Secondary | ICD-10-CM | POA: Diagnosis present

## 2024-06-09 DIAGNOSIS — R000 Tachycardia, unspecified: Secondary | ICD-10-CM | POA: Diagnosis not present

## 2024-06-09 DIAGNOSIS — R52 Pain, unspecified: Secondary | ICD-10-CM

## 2024-06-09 DIAGNOSIS — E871 Hypo-osmolality and hyponatremia: Secondary | ICD-10-CM | POA: Diagnosis not present

## 2024-06-09 DIAGNOSIS — C7951 Secondary malignant neoplasm of bone: Secondary | ICD-10-CM | POA: Insufficient documentation

## 2024-06-09 DIAGNOSIS — C649 Malignant neoplasm of unspecified kidney, except renal pelvis: Secondary | ICD-10-CM | POA: Diagnosis not present

## 2024-06-09 LAB — BASIC METABOLIC PANEL WITH GFR
Anion gap: 14 (ref 5–15)
BUN: 10 mg/dL (ref 8–23)
CO2: 26 mmol/L (ref 22–32)
Calcium: 9.4 mg/dL (ref 8.9–10.3)
Chloride: 89 mmol/L — ABNORMAL LOW (ref 98–111)
Creatinine, Ser: 0.41 mg/dL — ABNORMAL LOW (ref 0.44–1.00)
GFR, Estimated: >60 mL/min
Glucose, Bld: 74 mg/dL (ref 70–99)
Potassium: 3.4 mmol/L — ABNORMAL LOW (ref 3.5–5.1)
Sodium: 129 mmol/L — ABNORMAL LOW (ref 135–145)

## 2024-06-09 LAB — CBC WITH DIFFERENTIAL/PLATELET
Abs Immature Granulocytes: 0.08 K/uL — ABNORMAL HIGH (ref 0.00–0.07)
Basophils Absolute: 0 K/uL (ref 0.0–0.1)
Basophils Relative: 0 %
Eosinophils Absolute: 0.1 K/uL (ref 0.0–0.5)
Eosinophils Relative: 1 %
HCT: 31.6 % — ABNORMAL LOW (ref 36.0–46.0)
Hemoglobin: 10 g/dL — ABNORMAL LOW (ref 12.0–15.0)
Immature Granulocytes: 1 %
Lymphocytes Relative: 3 %
Lymphs Abs: 0.3 K/uL — ABNORMAL LOW (ref 0.7–4.0)
MCH: 27.4 pg (ref 26.0–34.0)
MCHC: 31.6 g/dL (ref 30.0–36.0)
MCV: 86.6 fL (ref 80.0–100.0)
Monocytes Absolute: 0.8 K/uL (ref 0.1–1.0)
Monocytes Relative: 10 %
Neutro Abs: 7.3 K/uL (ref 1.7–7.7)
Neutrophils Relative %: 85 %
Platelets: 188 K/uL (ref 150–400)
RBC: 3.65 MIL/uL — ABNORMAL LOW (ref 3.87–5.11)
RDW: 16 % — ABNORMAL HIGH (ref 11.5–15.5)
WBC: 8.6 K/uL (ref 4.0–10.5)
nRBC: 0 % (ref 0.0–0.2)

## 2024-06-09 MED ORDER — OXYCODONE-ACETAMINOPHEN 5-325 MG PO TABS
1.0000 | ORAL_TABLET | Freq: Once | ORAL | Status: AC
  Administered 2024-06-09: 1 via ORAL
  Filled 2024-06-09: qty 1

## 2024-06-09 MED ORDER — OXYCODONE HCL 5 MG PO TABS: 5.0000 mg | ORAL_TABLET | ORAL | 0 refills | Status: DC | PRN

## 2024-06-09 MED ORDER — KETOROLAC TROMETHAMINE 15 MG/ML IJ SOLN
7.5000 mg | Freq: Once | INTRAMUSCULAR | Status: AC
  Administered 2024-06-10: 7.5 mg via INTRAVENOUS
  Filled 2024-06-09: qty 1

## 2024-06-09 MED ORDER — LACTATED RINGERS IV SOLN: INTRAVENOUS | Status: DC

## 2024-06-09 MED ORDER — HYDROMORPHONE HCL 1 MG/ML IJ SOLN
1.0000 mg | Freq: Once | INTRAMUSCULAR | Status: AC
  Administered 2024-06-09: 1 mg via INTRAVENOUS
  Filled 2024-06-09: qty 1

## 2024-06-09 MED ORDER — LIDOCAINE 5 % EX PTCH
1.0000 | MEDICATED_PATCH | CUTANEOUS | Status: DC
  Administered 2024-06-10: 1 via TRANSDERMAL
  Filled 2024-06-09: qty 1

## 2024-06-09 MED ORDER — HYDROMORPHONE HCL 1 MG/ML IJ SOLN
0.5000 mg | Freq: Once | INTRAMUSCULAR | Status: AC
  Administered 2024-06-09: 0.5 mg via INTRAVENOUS
  Filled 2024-06-09: qty 1

## 2024-06-09 MED ORDER — LACTATED RINGERS IV BOLUS
1000.0000 mL | Freq: Once | INTRAVENOUS | Status: AC
  Administered 2024-06-09: 1000 mL via INTRAVENOUS

## 2024-06-09 MED ORDER — ONDANSETRON HCL 4 MG/2ML IJ SOLN
4.0000 mg | Freq: Once | INTRAMUSCULAR | Status: AC
  Administered 2024-06-09: 4 mg via INTRAVENOUS
  Filled 2024-06-09: qty 2

## 2024-06-09 NOTE — ED Notes (Signed)
 Pt placed on 2lnc.

## 2024-06-09 NOTE — ED Triage Notes (Signed)
 Pt BIB [facility] from home. Pt c/o generalized pain/weakness that has gotten worse over the past few days. Pt Hx kidney cancer.  100HR 20RR 29CAP 99.1 T

## 2024-06-09 NOTE — ED Provider Notes (Signed)
 " [place] [facility]GENC[facility] DE[facility]MENT AT [facility] Provider Note   CSN: [id number] Arrival date & time: 06/09/24  2059     Patient presents with: No chief complaint on file.   Ann Lee is a 68 y.o. female.   Pt is a 68y/o female with hx of  progressive metastatic urothelial carcinoma with gemcitabine/cisplatin and palliative radiation who is presenting today with with several complaints.  She reports that she suddenly started having much more severe pain in her right hip and leg where she has chronic pain but she also was not able to eat or drink today and has been very weak and unable to get up.  She reports this afternoon she also started to feel confused.  She has had ongoing shortness of breath which she cannot say is any worse today she denies any vomiting but has had nausea.  She has had no urinary complaints.  She has been taking the medications for pain that she has at home but they do not seem to be helping with the pain today.  She is not aware of having a fever.  She is not currently started the chemotherapy yet.  She was supposed to start that on Tuesday and had her last chemo 6 weeks ago.  The history is provided by the patient and medical records.       Prior to Admission medications  Medication Sig Start Date End Date Taking? Authorizing Provider  acetaminophen  (TYLENOL ) 325 MG tablet Take 2 tablets (650 mg total) by mouth every 6 (six) hours as needed. 05/25/24   Elnor Jayson LABOR, DO  cyclobenzaprine  (FLEXERIL ) 5 MG tablet Take 1 tablet (5 mg total) by mouth 2 (two) times daily as needed for muscle spasms. 05/25/24   Elnor Jayson LABOR, DO  gabapentin  (NEURONTIN ) 400 MG capsule Take 1 capsule (400 mg total) by mouth 3 (three) times daily. 05/29/24 06/28/24  Maritza Stagger, MD  lidocaine -prilocaine  (EMLA ) cream Apply 1 Application topically as needed (Apply to port 1 hour prior to its use, starting with 2nd treatment). 03/07/24   Cloretta Arley NOVAK, MD  LORazepam   (ATIVAN ) 0.5 MG tablet Take 1 tablet (0.5 mg total) by mouth every 6 (six) hours as needed for anxiety. Patient not taking: Reported on 06/01/2024 05/19/24   Debby Olam POUR, NP  ondansetron  (ZOFRAN ) 8 MG tablet Take 1 tablet (8 mg total) by mouth every 8 (eight) hours as needed for nausea or vomiting. Patient not taking: Reported on 06/01/2024 04/20/24   Cloretta Arley NOVAK, MD  oxyCODONE  (OXYCONTIN ) 10 mg 12 hr tablet Take 1 tablet (10 mg total) by mouth every 12 (twelve) hours. Patient not taking: Reported on 06/01/2024 05/26/24   Debby Olam POUR, NP  oxyCODONE  (ROXICODONE ) 5 MG immediate release tablet Take 1 tablet (5 mg total) by mouth every 4 (four) hours as needed for severe pain (pain score 7-10). 05/26/24   Debby Olam POUR, NP  phenazopyridine  (PYRIDIUM ) 200 MG tablet Take 1 tablet (200 mg total) by mouth 3 (three) times daily as needed (for pain with urination). Patient not taking: Reported on 06/01/2024 03/01/24 03/01/25  Devere Lonni Righter, MD  predniSONE  (DELTASONE ) 10 MG tablet Take 1 tablet (10 mg total) by mouth daily with breakfast. Patient not taking: Reported on 06/01/2024 05/26/24   Debby Olam POUR, NP  SUMAtriptan  (IMITREX ) 25 MG tablet Take one at onset of migraine and may repeat one in two hours as needed (max of 2 in 24 hours) Patient not taking: Reported on  06/01/2024 05/02/24   Micheal Wolm ORN, MD    Allergies: Shellfish allergy    Review of Systems  Updated Vital Signs There were no vitals taken for this visit.  Physical Exam Vitals and nursing note reviewed.  Constitutional:      General: She is in acute distress.     Appearance: She is well-developed.  HENT:     Head: Normocephalic and atraumatic.  Eyes:     Pupils: Pupils are equal, round, and reactive to light.  Cardiovascular:     Rate and Rhythm: Regular rhythm. Tachycardia present.     Pulses: Normal pulses.     Heart sounds: Normal heart sounds. No murmur heard.    No friction rub.  Pulmonary:      Effort: Pulmonary effort is normal.     Breath sounds: Normal breath sounds. No wheezing or rales.  Abdominal:     General: Bowel sounds are normal. There is no distension.     Palpations: Abdomen is soft.     Tenderness: There is no abdominal tenderness. There is no guarding or rebound.  Musculoskeletal:        General: Tenderness present. Normal range of motion.     Right lower leg: No edema.     Left lower leg: No edema.     Comments: No edema.  Pain with palpation in the sacrum and iliac crest.  Pulses palpable in the DP of both feet  Skin:    General: Skin is warm and dry.     Findings: No rash.  Neurological:     Mental Status: She is alert and oriented to person, place, and time. Mental status is at baseline.     Cranial Nerves: No cranial nerve deficit.     Sensory: No sensory deficit.     Motor: No weakness.     Comments: Globally weak but able to move extremities without difficulty.  Psychiatric:        Behavior: Behavior normal.     (all labs ordered are listed, but only abnormal results are displayed) Labs Reviewed - No data to display  EKG: None  Radiology: No results found.   Procedures   Medications Ordered in the ED - No data to display                                  Medical Decision Making Amount and/or Complexity of Data Reviewed Labs: ordered. Radiology: ordered.  Risk Prescription drug management.   Pt with multiple medical problems and comorbidities and presenting today with a complaint that caries a high risk for morbidity and mortality.  Here today with the above complaints.  Concern for possible pathologic fracture causing patient's pain versus dehydration, AKI, electrolyte abnormalities.  Patient also reports feeling confused today currently denies any confusion but with history of advanced cancer concern for possible mets to the brain.  Low suspicion for hepatic encephalopathy.  She has no focal neurologic deficits on my exam.  Patient  is not having any fever or localized abdominal pain or chest pain and lower suspicion for infectious etiology at this time. Patient was given IV fluids, pain control. I independently interpreted patient's labs and EKG.  CBC with stable hemoglobin of 10 and a normal white count, BMP with new hyponatremia today of 129 with stable creatinine and normal anion gap. I have independently visualized and interpreted pt's images today.  Chest without significant pneumonia but  does have a small effusion in the left lung. On repeat evaluation patient reports feeling much better after pain control.  She is getting IV fluids now.  She reports the nausea has improved.  Discussed with her her lab results and hyponatremia.  She reports she would like to go home if she could hold down something.  Will p.o. challenge.       Final diagnoses:  None    ED Discharge Orders     None          Doretha Folks, MD 06/09/24 2324  "

## 2024-06-09 NOTE — Discharge Instructions (Addendum)
 Continue your long acting oxycontin  and then take 1 or 2 oxycodone  every 4-6 hours for pain.  You can also use voltaren gel or lidocaine  patches in addition to the medications.  A prescription was sent to your pharmacy for some pain meds to get you to your appointment then you can talk to your oncologist about different pain management.  Make sure you are drinking plenty of fluids to stay hydrated.

## 2024-06-10 NOTE — ED Provider Notes (Signed)
 Patient signed out pending p.o. challenge and ambulation.  Patient was provided a prescription for pain medication by Dr. Doretha.  She has been able to tolerate p.o. and ambulated at baseline per nursing and is ready to go.  Vital signs reviewed and reassuring.  Physical Exam  BP 111/85   Pulse 96   Temp 98.1 F (36.7 C) (Oral)   Resp 16   SpO2 93%    Procedures  Procedures  ED Course / MDM    Medical Decision Making Amount and/or Complexity of Data Reviewed Labs: ordered. Radiology: ordered.  Risk Prescription drug management.   Problem List Items Addressed This Visit   None Visit Diagnoses       Malignant neoplasm metastatic to bone [facility]    -  Primary     Pain         Hyponatremia                 Hanif Radin, Charmaine FALCON, MD 06/10/24 [phone]

## 2024-06-11 ENCOUNTER — Other Ambulatory Visit: Payer: Self-pay | Admitting: Oncology

## 2024-06-12 ENCOUNTER — Ambulatory Visit

## 2024-06-12 ENCOUNTER — Telehealth: Payer: Self-pay | Admitting: *Deleted

## 2024-06-12 ENCOUNTER — Inpatient Hospital Stay

## 2024-06-12 ENCOUNTER — Inpatient Hospital Stay (HOSPITAL_BASED_OUTPATIENT_CLINIC_OR_DEPARTMENT_OTHER): Admitting: Oncology

## 2024-06-12 VITALS — BP 100/67 | HR 91 | Temp 98.1°F | Resp 18 | Ht 63.0 in

## 2024-06-12 VITALS — BP 136/71 | HR 84 | Temp 97.8°F | Resp 18

## 2024-06-12 DIAGNOSIS — C641 Malignant neoplasm of right kidney, except renal pelvis: Secondary | ICD-10-CM

## 2024-06-12 DIAGNOSIS — E860 Dehydration: Secondary | ICD-10-CM

## 2024-06-12 LAB — CBC WITH DIFFERENTIAL (CANCER CENTER ONLY)
Abs Immature Granulocytes: 0.02 K/uL (ref 0.00–0.07)
Basophils Absolute: 0 K/uL (ref 0.0–0.1)
Basophils Relative: 0 %
Eosinophils Absolute: 0.1 K/uL (ref 0.0–0.5)
Eosinophils Relative: 3 %
HCT: 30.1 % — ABNORMAL LOW (ref 36.0–46.0)
Hemoglobin: 9.7 g/dL — ABNORMAL LOW (ref 12.0–15.0)
Immature Granulocytes: 1 %
Lymphocytes Relative: 7 %
Lymphs Abs: 0.3 K/uL — ABNORMAL LOW (ref 0.7–4.0)
MCH: 27.1 pg (ref 26.0–34.0)
MCHC: 32.2 g/dL (ref 30.0–36.0)
MCV: 84.1 fL (ref 80.0–100.0)
Monocytes Absolute: 0.5 K/uL (ref 0.1–1.0)
Monocytes Relative: 13 %
Neutro Abs: 2.9 K/uL (ref 1.7–7.7)
Neutrophils Relative %: 76 %
Platelet Count: 172 K/uL (ref 150–400)
RBC: 3.58 MIL/uL — ABNORMAL LOW (ref 3.87–5.11)
RDW: 15.6 % — ABNORMAL HIGH (ref 11.5–15.5)
WBC Count: 3.8 K/uL — ABNORMAL LOW (ref 4.0–10.5)
nRBC: 0 % (ref 0.0–0.2)

## 2024-06-12 LAB — CMP (CANCER CENTER ONLY)
ALT: 5 U/L (ref 0–44)
AST: <10 U/L — ABNORMAL LOW (ref 15–41)
Albumin: 3.1 g/dL — ABNORMAL LOW (ref 3.5–5.0)
Alkaline Phosphatase: 78 U/L (ref 38–126)
Anion gap: 19 — ABNORMAL HIGH (ref 5–15)
BUN: 12 mg/dL (ref 8–23)
CO2: 24 mmol/L (ref 22–32)
Calcium: 10.2 mg/dL (ref 8.9–10.3)
Chloride: 91 mmol/L — ABNORMAL LOW (ref 98–111)
Creatinine: 0.43 mg/dL — ABNORMAL LOW (ref 0.44–1.00)
GFR, Estimated: >60 mL/min
Glucose, Bld: 86 mg/dL (ref 70–99)
Potassium: 3.1 mmol/L — ABNORMAL LOW (ref 3.5–5.1)
Sodium: 133 mmol/L — ABNORMAL LOW (ref 135–145)
Total Bilirubin: 0.4 mg/dL (ref 0.0–1.2)
Total Protein: 5.5 g/dL — ABNORMAL LOW (ref 6.5–8.1)

## 2024-06-12 LAB — MAGNESIUM: Magnesium: 1.8 mg/dL (ref 1.7–2.4)

## 2024-06-12 MED ORDER — SODIUM CHLORIDE 0.9 % IV SOLN: INTRAVENOUS | Status: AC

## 2024-06-12 MED ORDER — MORPHINE SULFATE (PF) 2 MG/ML IV SOLN
2.0000 mg | Freq: Once | INTRAVENOUS | Status: AC
  Administered 2024-06-12: 2 mg via INTRAVENOUS
  Filled 2024-06-12: qty 1

## 2024-06-12 MED ORDER — OXYCODONE HCL 5 MG/5ML PO SOLN: 5.0000 mg | ORAL | 0 refills | Status: DC | PRN

## 2024-06-12 MED ORDER — DEXAMETHASONE 4 MG PO TABS: 4.0000 mg | ORAL_TABLET | Freq: Every day | ORAL | 2 refills | Status: DC

## 2024-06-12 NOTE — Patient Instructions (Signed)

## 2024-06-12 NOTE — Progress Notes (Signed)
 " [facility] OFFICE PROGRESS NOTE   Diagnosis: Urothelial carcinoma  INTERVAL HISTORY:   Mr Prest returns for a scheduled visit.  She is here with her son and sister.  She continues to have pain in the lower back and hip .  The pain is partially relieved with oxycodone , but she has nausea which makes it difficult to take pills.  She stays in the bed all of the time and has limited oral intake.  She is not ambulating. She was seen in the emergency room 06/09/2024.  A CT head revealed multiple calvarial lucencies suspicious for metastatic disease. Objective:  Vital signs in last 24 hours:  Blood pressure 100/67, pulse 91, temperature 98.1 F (36.7 C), temperature source Temporal, resp. rate 18, height 5' 3 (1.6 m), SpO2 99%.    HEENT: The mouth is dry, no thrush Resp: Lungs clear anteriorly, no respiratory distress Cardio: Regular rate and rhythm GI: No hepatosplenomegaly Vascular: No leg edema  Skin: Superficial sacral skin breakdown  Portacath/PICC-without erythema  Lab Results:  Lab Results  Component Value Date   WBC 3.8 (L) 06/12/2024   HGB 9.7 (L) 06/12/2024   HCT 30.1 (L) 06/12/2024   MCV 84.1 06/12/2024   PLT 172 06/12/2024   NEUTROABS 2.9 06/12/2024    CMP  Lab Results  Component Value Date   NA 133 (L) 06/12/2024   K 3.1 (L) 06/12/2024   CL 91 (L) 06/12/2024   CO2 24 06/12/2024   GLUCOSE 86 06/12/2024   BUN 12 06/12/2024   CREATININE 0.43 (L) 06/12/2024   CALCIUM 10.2 06/12/2024   PROT 5.5 (L) 06/12/2024   ALBUMIN 3.1 (L) 06/12/2024   AST <10 (L) 06/12/2024   ALT 5 06/12/2024   ALKPHOS 78 06/12/2024   BILITOT 0.4 06/12/2024   GFRNONAA >60 06/12/2024   GFRAA  03/15/2008    >60        The eGFR has been calculated using the MDRD equation. This calculation has not been validated in all clinical    No results found for: CEA1, CEA, CAN199, CA125  No results found for: INR, LABPROT  Imaging:  DG Chest Port 1  View Result Date: 06/09/2024 EXAM: 1 VIEW(S) XRAY OF THE CHEST 06/09/2024 09:52:04 PM COMPARISON: None available. CLINICAL HISTORY: pain and trouble thinking FINDINGS: LINES, TUBES AND DEVICES: Right IJ Port-A-Cath in place with tip at cavoatrial junction. LUNGS AND PLEURA: No focal pulmonary opacity. Trace left pleural effusion. No pneumothorax. HEART AND MEDIASTINUM: No acute abnormality of the cardiac and mediastinal silhouettes. BONES AND SOFT TISSUES: No acute osseous abnormality. IMPRESSION: 1. Trace left pleural effusion. Electronically signed by: Morgane Naveau MD 06/09/2024 11:02 PM EST RP Workstation: [id number]   CT Head Wo Contrast Result Date: 06/09/2024 EXAM: CT HEAD WITHOUT CONTRAST 06/09/2024 09:48:17 PM TECHNIQUE: CT of the head was performed without the administration of intravenous contrast. Automated exposure control, iterative reconstruction, and/or weight based adjustment of the mA/kV was utilized to reduce the radiation dose to as low as reasonably achievable. COMPARISON: CT head 07/29/2008. CLINICAL HISTORY: Mental status change, unknown cause FINDINGS: BRAIN AND VENTRICLES: No acute hemorrhage. No evidence of acute infarct. No hydrocephalus. No extra-axial collection. No mass effect or midline shift. ORBITS: No acute abnormality. SINUSES: No acute abnormality. SOFT TISSUES AND SKULL: Multiple new calvarial lucencies. No skull fracture. IMPRESSION: 1. No acute intracranial abnormality. 2. Multiple new calvarial lucencies, suspicious for osseous metastatic disease given known history cancer. MRI head with contrast could better assess and also better  evaluate for intracranial metastatic disease if clinically warranted. Electronically signed by: Gilmore Molt 06/09/2024 10:20 PM EST RP Workstation: [id number]    Medications: I have reviewed the patient's current medications.   Assessment/Plan:  Right renal mass CTs abdomen/pelvis 02/01/2024-mild right-sided hydroureteronephrosis  extending to the mid to distal ureter; multiple enlarged retroperitoneal lymph nodes involving the periaortic and aortocaval spaces; ovoid hyperdensity along the lower pole of the right kidney measuring 1.8 cm.   CT renal 02/08/2024-1.6 x 1.7 x 2.0 cm hyperattenuating moderately enhancing mass in the right kidney lower pole calyx with subtle extension of tumor into the anteroinferior wall of the right renal pelvis; moderate right hydronephrosis and hydroureter up to the level of the right mid ureter; additional linear bandlike enhancing area in the right upper ureter; extensive heterogeneous retroperitoneal lymphadenopathy.   PET scan 02/11/2024-hypermetabolic left supraclavicular, AP window, retrocrural, right gastric and retroperitoneal adenopathy; solitary hypermetabolic skeletal lesion in the vicinity of the right anterior S1 sacral foramen. Biopsy left supraclavicular lymph node 02/24/2024-metastatic carcinoma compatible with clinical suspicion of metastatic urothelial carcinoma; foundation 1-microsatellite stable, HRDsig negative, tumor mutation burden 1, negative for FGFR3 alteration Cystoscopy/right ureter stent placement 03/01/2024-No intravesical or urethral abnormality seen; solitary right collecting system with no filling defects or dilation involving the right ureter or right renal pelvis; papillary tumor involving the right mid pole calyx with features concerning for urothelial carcinoma. Cycle 1 day 1 pembrolizumab  and enfortumab 03/09/2024; day 8 held 03/16/2024 due to rash and diarrhea CT chest 03/13/2024-negative for PE; progressive adenopathy in the chest and upper abdomen CT lumbar spine 03/13/2024-known S1 metastasis only faintly visualized.  Loss of cortex at the S1 foramen with possible soft tissue component, possibly abutting the subjacent nerve root.  Upper retroperitoneal adenopathy again seen, progressive. 03/16/2024 cycle 1 day 8 enfortumab held due to diarrhea and rash 03/29/2024 cycle  2-day 1 Pembrolizumab /enfortumab, enfortumab dose reduced due to rash and diarrhea following cycle 1 day 1, day 8 enfortumab 04/06/2024 04/20/2024 cycle 3-day 1 pembrolizumab /enfortumab; day 8 held due to failure to thrive CTs 05/04/2024 report reads progression of nodal and osseous metastasis.  Per Dr. Andriette review no significant change in chest lymphadenopathy, mixed response involving retroperitoneal nodes, sacral lesion appears enlarged, new sclerotic lesions in the thoracic spine may be related to scarring from treated metastases. Mutual decision to continue Pembrolizumab  and refer for second opinion at [facility] Recommend change to second split dose line cisplatin/gemcitabine day 1, 8 q21 days starting ~06/05/24 06/09/2024 CT head-multiple calvarial metastasis Back pain secondary to #1 Palliative radiation to R pelvis/SI per Dr. Maritza 12/17 - 06/05/24 Oxycodone , medrol  dose pack, gabapentin , and to pick up oxycontin  today Change in bowel habits, postprandial abdominal pain Emphysema Tobacco use Rash and diarrhea following cycle 1 day 1 Pembrolizumab /enfortumab N/V, GERD, low appetite   Disposition: Ms. Marmo has metastatic urothelial carcinoma.  She is scheduled to begin salvage therapy with gemcitabine/cisplatin tomorrow.  She has a poor performance status.  She is not a candidate for chemotherapy in her current condition.  I discussed the poor prognosis with Ms. Pasqua and her family.  We discussed hospice care.  I recommend home hospice.  She would like to receive intravenous fluids today and tomorrow to see if her status improves.  She will begin a trial of Decadron  for nausea and as an appetite stimulant.  I will add liquid oxycodone  to use as needed for pain.  She will be scheduled for an office visit 06/14/2024.  We discussed CPR and ACLS.  She will be placed on a no CODE BLUE status.  Arley Hof, MD  06/12/2024  1:26 PM   "

## 2024-06-12 NOTE — Telephone Encounter (Signed)
 Ms. Abdulla son left VM on 12/26 requesting refill on her oxycodone  (was out). Called Mrs. Grein today and made her aware office was closed on 12/26. Noted the emergency room provided script for # 15 pills. She will f/u today as scheduled and get refill.

## 2024-06-12 NOTE — Progress Notes (Signed)
@   1400 Patient rates her pain now 3/10. @ 1500 Patient IVF completed. Pain is back to 5/10, but she wishes to leave and take her pain med at home. She agrees to return on 12/30 for more IVF.

## 2024-06-13 ENCOUNTER — Inpatient Hospital Stay

## 2024-06-13 ENCOUNTER — Ambulatory Visit

## 2024-06-13 DIAGNOSIS — C641 Malignant neoplasm of right kidney, except renal pelvis: Secondary | ICD-10-CM | POA: Diagnosis not present

## 2024-06-13 DIAGNOSIS — E860 Dehydration: Secondary | ICD-10-CM

## 2024-06-13 MED ORDER — SODIUM CHLORIDE 0.9 % IV SOLN: INTRAVENOUS | Status: DC

## 2024-06-13 NOTE — Patient Instructions (Signed)

## 2024-06-13 NOTE — Progress Notes (Signed)
 Pt in today for IVF (one liter of NS over two hours has been ordered by provider). Pt tolerated well. Son-in-law assisted her with transfers to the wheelchair at her request.  [facility] not [facility] the liquid. Pt asks for a new rx to be [facility] to [facility] on [address] in [place] Dr. Andriette nurse notified Is[place]) and will arrange with Dr. Cloretta.

## 2024-06-14 ENCOUNTER — Other Ambulatory Visit (HOSPITAL_BASED_OUTPATIENT_CLINIC_OR_DEPARTMENT_OTHER): Payer: Self-pay

## 2024-06-14 ENCOUNTER — Inpatient Hospital Stay: Admitting: Oncology

## 2024-06-14 ENCOUNTER — Ambulatory Visit

## 2024-06-14 ENCOUNTER — Encounter: Payer: Self-pay | Admitting: Oncology

## 2024-06-14 ENCOUNTER — Inpatient Hospital Stay

## 2024-06-14 ENCOUNTER — Inpatient Hospital Stay (HOSPITAL_BASED_OUTPATIENT_CLINIC_OR_DEPARTMENT_OTHER): Admitting: Oncology

## 2024-06-14 ENCOUNTER — Telehealth: Payer: Self-pay | Admitting: Oncology

## 2024-06-14 ENCOUNTER — Other Ambulatory Visit: Payer: Self-pay | Admitting: Oncology

## 2024-06-14 VITALS — BP 116/76 | HR 100 | Temp 98.1°F | Resp 18 | Ht 63.0 in

## 2024-06-14 DIAGNOSIS — E860 Dehydration: Secondary | ICD-10-CM

## 2024-06-14 DIAGNOSIS — C641 Malignant neoplasm of right kidney, except renal pelvis: Secondary | ICD-10-CM

## 2024-06-14 MED ORDER — LIDOCAINE VISCOUS HCL 2 % MT SOLN
5.0000 mL | Freq: Four times a day (QID) | OROMUCOSAL | 1 refills | Status: DC | PRN
  Filled 2024-06-14: qty 240, 12d supply, fill #0

## 2024-06-14 MED ORDER — OXYCODONE HCL 5 MG/5ML PO SOLN: 5.0000 mg | ORAL | 0 refills | Status: DC | PRN

## 2024-06-14 MED ORDER — MAGIC MOUTHWASH: 5.0000 mL | Freq: Four times a day (QID) | ORAL | 1 refills | Status: DC | PRN

## 2024-06-14 MED ORDER — NYSTATIN 100000 UNIT/ML MT SUSP
OROMUCOSAL | 1 refills | Status: DC
  Filled 2024-06-14: qty 240, 12d supply, fill #0

## 2024-06-14 NOTE — Progress Notes (Signed)
 Insurance has denied her oxycodone  solution. Spoke w/pharmacy and they have her on a discount card on file and her out-of-pocket for 150 ml + $25.81. Family will be able to pay this and will pick up today.  Also called [facility] and requested telehealth visit for patient to discuss when her stent can be exchanged. Reports having some flank pain.

## 2024-06-14 NOTE — Telephone Encounter (Signed)
 Notified Ms. Deyarmin that she is scheduled for lab/flush and IVF on 1/02 at 12:30 in a bed. She was very adult nurse.

## 2024-06-14 NOTE — Progress Notes (Signed)
 " [facility] OFFICE PROGRESS NOTE   Diagnosis: Urothelial carcinoma  INTERVAL HISTORY:  Ann Lee returns as scheduled.  She is here with her son and sister.  She continues to have hip pain.  She reports improvement in her energy level and oral intake since starting Decadron  earlier this week.  She felt better after receiving IV fluids.  Her ability to ambulate is limited by pain.  Objective:  Vital signs in last 24 hours:  Blood pressure 116/76, pulse 100, temperature 98.1 F (36.7 C), temperature source Temporal, resp. rate 18, height 5' 3 (1.6 m), SpO2 100%.    HEENT: Mild white coat over the tongue, no buccal thrush Resp: Lungs clear bilaterally Cardio: Regular rate and rhythm GI: No hepatomegaly, nontender Vascular: No leg edema Neuro: Alert and oriented   Portacath/PICC-without erythema  Lab Results:  Lab Results  Component Value Date   WBC 3.8 (L) 06/12/2024   HGB 9.7 (L) 06/12/2024   HCT 30.1 (L) 06/12/2024   MCV 84.1 06/12/2024   PLT 172 06/12/2024   NEUTROABS 2.9 06/12/2024    CMP  Lab Results  Component Value Date   NA 133 (L) 06/12/2024   K 3.1 (L) 06/12/2024   CL 91 (L) 06/12/2024   CO2 24 06/12/2024   GLUCOSE 86 06/12/2024   BUN 12 06/12/2024   CREATININE 0.43 (L) 06/12/2024   CALCIUM 10.2 06/12/2024   PROT 5.5 (L) 06/12/2024   ALBUMIN 3.1 (L) 06/12/2024   AST <10 (L) 06/12/2024   ALT 5 06/12/2024   ALKPHOS 78 06/12/2024   BILITOT 0.4 06/12/2024   GFRNONAA >60 06/12/2024   GFRAA  03/15/2008    >60        The eGFR has been calculated using the MDRD equation. This calculation has not been validated in all clinical    No results found for: CEA1, CEA, CAN199, CA125  No results found for: INR, LABPROT  Imaging:  No results found.  Medications: I have reviewed the patient's current medications.   Assessment/Plan:  Right renal mass CTs abdomen/pelvis 02/01/2024-mild right-sided hydroureteronephrosis  extending to the mid to distal ureter; multiple enlarged retroperitoneal lymph nodes involving the periaortic and aortocaval spaces; ovoid hyperdensity along the lower pole of the right kidney measuring 1.8 cm.   CT renal 02/08/2024-1.6 x 1.7 x 2.0 cm hyperattenuating moderately enhancing mass in the right kidney lower pole calyx with subtle extension of tumor into the anteroinferior wall of the right renal pelvis; moderate right hydronephrosis and hydroureter up to the level of the right mid ureter; additional linear bandlike enhancing area in the right upper ureter; extensive heterogeneous retroperitoneal lymphadenopathy.   PET scan 02/11/2024-hypermetabolic left supraclavicular, AP window, retrocrural, right gastric and retroperitoneal adenopathy; solitary hypermetabolic skeletal lesion in the vicinity of the right anterior S1 sacral foramen. Biopsy left supraclavicular lymph node 02/24/2024-metastatic carcinoma compatible with clinical suspicion of metastatic urothelial carcinoma; foundation 1-microsatellite stable, HRDsig negative, tumor mutation burden 1, negative for FGFR3 alteration Cystoscopy/right ureter stent placement 03/01/2024-No intravesical or urethral abnormality seen; solitary right collecting system with no filling defects or dilation involving the right ureter or right renal pelvis; papillary tumor involving the right mid pole calyx with features concerning for urothelial carcinoma.  Renal pelvis washings: High-grade urothelial carcinoma, HER2 3+, mismatch repair protein expression intact Cycle 1 day 1 pembrolizumab  and enfortumab 03/09/2024; day 8 held 03/16/2024 due to rash and diarrhea CT chest 03/13/2024-negative for PE; progressive adenopathy in the chest and upper abdomen CT lumbar spine 03/13/2024-known  S1 metastasis only faintly visualized.  Loss of cortex at the S1 foramen with possible soft tissue component, possibly abutting the subjacent nerve root.  Upper retroperitoneal adenopathy  again seen, progressive. 03/16/2024 cycle 1 day 8 enfortumab held due to diarrhea and rash 03/29/2024 cycle 2-day 1 Pembrolizumab /enfortumab, enfortumab dose reduced due to rash and diarrhea following cycle 1 day 1, day 8 enfortumab 04/06/2024 04/20/2024 cycle 3-day 1 pembrolizumab /enfortumab; day 8 held due to failure to thrive CTs 05/04/2024 report reads progression of nodal and osseous metastasis.  Per Dr. Andriette review no significant change in chest lymphadenopathy, mixed response involving retroperitoneal nodes, sacral lesion appears enlarged, new sclerotic lesions in the thoracic spine may be related to scarring from treated metastases. Mutual decision to continue Pembrolizumab  and refer for second opinion at [facility] Recommend change to second split dose line cisplatin/gemcitabine day 1, 8 q21 days starting ~06/05/24 06/09/2024 CT head-multiple calvarial metastasis Back pain secondary to #1 Palliative radiation to R pelvis/SI per Dr. Maritza 12/17 - 06/05/24 Oxycodone , medrol  dose pack, gabapentin , and to pick up oxycontin  today Change in bowel habits, postprandial abdominal pain Emphysema Tobacco use Rash and diarrhea following cycle 1 day 1 Pembrolizumab /enfortumab N/V, GERD, low appetite    Disposition: Ann Lee has metastatic urothelial carcinoma.  She is symptomatic with pain, nausea, and anorexia.  She developed progressive disease after treatment with enfortumab/pembrolizumab .  She is scheduled to begin treatment with gemcitabine/cisplatin.  Treatment was held earlier this week due to her poor performance status.  Her performance status is partially improved after receiving intravenous fluids and beginning Decadron .  She would like to begin a trial of salvage chemotherapy as opposed to hospice care.  She will receive intravenous fluids and as needed electrolyte replacement on 06/16/2024.  She will return for an office visit 06/21/2024 with the plan to begin  gemcitabine/cisplatin 06/22/2024 if her performance status continues to improve.  The tumor returned HER2 positive by immunohistochemistry.  She may be a candidate for treatment with trastuzumab/derutecan if she does not improve with gemcitabine/cisplatin.    Arley Hof, MD  06/14/2024  12:52 PM   "

## 2024-06-16 ENCOUNTER — Inpatient Hospital Stay: Attending: Oncology

## 2024-06-16 ENCOUNTER — Other Ambulatory Visit: Payer: Self-pay

## 2024-06-16 ENCOUNTER — Inpatient Hospital Stay

## 2024-06-16 ENCOUNTER — Ambulatory Visit: Payer: Self-pay

## 2024-06-16 DIAGNOSIS — R112 Nausea with vomiting, unspecified: Secondary | ICD-10-CM | POA: Diagnosis not present

## 2024-06-16 DIAGNOSIS — C641 Malignant neoplasm of right kidney, except renal pelvis: Secondary | ICD-10-CM

## 2024-06-16 DIAGNOSIS — N1330 Unspecified hydronephrosis: Secondary | ICD-10-CM | POA: Diagnosis not present

## 2024-06-16 DIAGNOSIS — C7951 Secondary malignant neoplasm of bone: Secondary | ICD-10-CM | POA: Diagnosis not present

## 2024-06-16 DIAGNOSIS — C689 Malignant neoplasm of urinary organ, unspecified: Secondary | ICD-10-CM

## 2024-06-16 DIAGNOSIS — G893 Neoplasm related pain (acute) (chronic): Secondary | ICD-10-CM | POA: Diagnosis present

## 2024-06-16 DIAGNOSIS — M549 Dorsalgia, unspecified: Secondary | ICD-10-CM | POA: Diagnosis not present

## 2024-06-16 DIAGNOSIS — E860 Dehydration: Secondary | ICD-10-CM

## 2024-06-16 DIAGNOSIS — N2889 Other specified disorders of kidney and ureter: Secondary | ICD-10-CM

## 2024-06-16 LAB — CMP (CANCER CENTER ONLY)
ALT: 7 U/L (ref 0–44)
AST: 13 U/L — ABNORMAL LOW (ref 15–41)
Albumin: 3 g/dL — ABNORMAL LOW (ref 3.5–5.0)
Alkaline Phosphatase: 78 U/L (ref 38–126)
Anion gap: 17 — ABNORMAL HIGH (ref 5–15)
BUN: 19 mg/dL (ref 8–23)
CO2: 27 mmol/L (ref 22–32)
Calcium: 9.9 mg/dL (ref 8.9–10.3)
Chloride: 88 mmol/L — ABNORMAL LOW (ref 98–111)
Creatinine: 0.42 mg/dL — ABNORMAL LOW (ref 0.44–1.00)
GFR, Estimated: >60 mL/min
Glucose, Bld: 82 mg/dL (ref 70–99)
Potassium: 2.7 mmol/L — CL (ref 3.5–5.1)
Sodium: 131 mmol/L — ABNORMAL LOW (ref 135–145)
Total Bilirubin: 0.6 mg/dL (ref 0.0–1.2)
Total Protein: 5.3 g/dL — ABNORMAL LOW (ref 6.5–8.1)

## 2024-06-16 LAB — MAGNESIUM: Magnesium: 1.8 mg/dL (ref 1.7–2.4)

## 2024-06-16 MED ORDER — SODIUM CHLORIDE 0.9 % IV SOLN: INTRAVENOUS | Status: AC

## 2024-06-16 MED ORDER — POTASSIUM CHLORIDE 10 MEQ/100ML IV SOLN
10.0000 meq | INTRAVENOUS | Status: AC
  Administered 2024-06-16 (×2): 10 meq via INTRAVENOUS
  Filled 2024-06-16: qty 100

## 2024-06-16 MED ORDER — POTASSIUM CHLORIDE CRYS ER 20 MEQ PO TBCR: 20.0000 meq | EXTENDED_RELEASE_TABLET | Freq: Every day | ORAL | 1 refills | Status: DC

## 2024-06-16 NOTE — Progress Notes (Signed)
 Infusion nurse requested evaluation of sacral ulcer.  Ann Lee has a small area of slightly raised erythema left sacrum near the upper gluteal crease.  Her family brought a pad to apply.

## 2024-06-16 NOTE — Progress Notes (Signed)
 CRITICAL VALUE STICKER  CRITICAL VALUE: K+  2.7  RECEIVER (on-site recipient of call): Keene Crown, RN DATE & TIME NOTIFIED:  06/17/23  1346 MESSENGER (representative from lab): Thersia, Lab MD NOTIFIED:  Olam Ned, NP TIME OF NOTIFICATION: 1348 RESPONSE:  Aware

## 2024-06-16 NOTE — Progress Notes (Signed)
 Potassium Rx for 20 MEQ sent to [facility] on [address] per patient's wishes. Instructions provided to family members to take 1 tablet tonight. Then, starting tomorrow and for next x3 days, to take 1 tablet in am and 1 tablet in evening. Then to start taking 1 tablet daily starting on day 4. Reviewed this plan with family who understood and agreed with all instructions.

## 2024-06-16 NOTE — Patient Instructions (Addendum)
 Potassium Chloride  Extended-Release Capsules or Tablets What is this medication? POTASSIUM CHLORIDE  (poe TASS i um KLOOR ide) prevents and treats low levels of potassium in your body. Potassium plays an important role in maintaining the health of your kidneys, heart, muscles, and nervous system. This medicine may be used for other purposes; ask your health care provider or pharmacist if you have questions. COMMON BRAND NAME(S): K-10, K-8, Klor-Con , Micro-K , Micro-K  Extencaps, K-Dur Olam Ned, NP wants you to take Kdur 20 meq x one tablet tonight; starting tomorrow begin Kdur 20 meq twice a day for 3 days, then Kdur 20 meq one tablet daily. Ask your pharmacist about dissolving the tablet or adding it to food/liquids. What should I tell my care team before I take this medication? They need to know if you have any of these conditions: Addison disease Dehydration Diabetes, high blood sugar Difficulty swallowing Heart disease High levels of potassium in the blood Irregular heartbeat or rhythm Kidney disease Large areas of burned skin Stomach ulcers, other stomach or intestine problems An unusual or allergic reaction to potassium, other medications, foods, dyes, or preservatives Pregnant or trying to get pregnant Breast-feeding How should I use this medication? Take this medication by mouth with a glass of water. Take it as directed on the prescription label at the same time every day. Take it with food. Do not cut, crush, chew, or suck this medication. Swallow the capsules whole. You may open the capsule and put the contents in a teaspoon of soft food, such as applesauce or pudding. Do not add to hot foods. Swallow the mixture right away. Do not chew the mixture. Drink a glass of water or juice after taking the mixture. Keep taking this medication unless your care team tells you to stop. Talk to your care team about the use of this medication in children. Special care may be needed. Overdosage:  If you think you have taken too much of this medicine contact a poison control center or emergency room at once. NOTE: This medicine is only for you. Do not share this medicine with others. What if I miss a dose? If you miss a dose, take it as soon as you can. If it is almost time for your next dose, take only that dose. Do not take double or extra doses. What may interact with this medication? Do not take this medication with any of the following: Certain diuretics, such as spironolactone, triamterene Certain medications for stomach problems, such as atropine; difenoxin or glycopyrrolate Eplerenone Sodium polystyrene sulfonate This medication may also interact with the following: Certain medications for blood pressure or heart disease, such as lisinopril, losartan, quinapril, valsartan Medications that lower your chance of fighting infection, such as cyclosporine, tacrolimus NSAIDs, medications for pain and inflammation, such as ibuprofen  or naproxen Other potassium supplements Salt substitutes This list may not describe all possible interactions. Give your health care provider a list of all the medicines, herbs, non-prescription drugs, or dietary supplements you use. Also tell them if you smoke, drink alcohol, or use illegal drugs. Some items may interact with your medicine. What should I watch for while using this medication? Visit your care team for regular check-ups. You will need lab work done regularly. You may need to be on a special diet while taking this medication. Ask your care team. What side effects may I notice from receiving this medication? Side effects that you should report to your care team as soon as possible: Allergic reactions--skin rash, itching, hives, swelling  of the face, lips, tongue, or throat Bowel blockage--stomach cramping, unable to have a bowel movement or pass gas, loss of appetite, vomiting Esophageal ulcer--loss of appetite, throat pain, pain or trouble  swallowing, heartburn, nausea, vomiting, dry cough High potassium level--muscle weakness, fast or irregular heartbeat Stomach bleeding--bloody or black, tar-like stools, vomiting blood or brown material that looks like coffee grounds Side effects that usually do not require medical attention (report to your care team if they continue or are bothersome): Diarrhea Gas Nausea Stomach pain Vomiting This list may not describe all possible side effects. Call your doctor for medical advice about side effects. You may report side effects to FDA at 1-800-FDA-1088. Where should I keep my medication? Keep out of the reach of children. Store at room temperature between 15 and 30 degrees C (59 and 86 degrees F ). Keep bottle closed tightly to protect this medication from light and moisture. Throw away any unused medication after the expiration date. NOTE: This sheet is a summary. It may not cover all possible information. If you have questions about this medicine, talk to your doctor, pharmacist, or health care provider.  2024 Elsevier/Gold Standard (2021-12-12 00:00:00)

## 2024-06-16 NOTE — Progress Notes (Signed)
 3x3 foam dressing pad applied to sacrum by Katelin S, RN.  I entered Ann Lee Kdur instructions on pt's AVS and reviewed with family. They are to ask their pharmacist about dissolving the potassium tablets.

## 2024-06-18 ENCOUNTER — Encounter: Payer: Self-pay | Admitting: Oncology

## 2024-06-20 ENCOUNTER — Emergency Department (HOSPITAL_COMMUNITY)
Admission: EM | Admit: 2024-06-20 | Discharge: 2024-06-20 | Disposition: A | Discharge status: Home / Self Care | Attending: Emergency Medicine | Admitting: Emergency Medicine

## 2024-06-20 ENCOUNTER — Other Ambulatory Visit: Payer: Self-pay

## 2024-06-20 ENCOUNTER — Emergency Department (HOSPITAL_COMMUNITY)

## 2024-06-20 ENCOUNTER — Encounter (HOSPITAL_COMMUNITY): Payer: Self-pay

## 2024-06-20 DIAGNOSIS — E871 Hypo-osmolality and hyponatremia: Secondary | ICD-10-CM | POA: Insufficient documentation

## 2024-06-20 DIAGNOSIS — Z85830 Personal history of malignant neoplasm of bone: Secondary | ICD-10-CM | POA: Insufficient documentation

## 2024-06-20 DIAGNOSIS — M545 Low back pain, unspecified: Secondary | ICD-10-CM | POA: Diagnosis present

## 2024-06-20 DIAGNOSIS — C799 Secondary malignant neoplasm of unspecified site: Secondary | ICD-10-CM | POA: Diagnosis not present

## 2024-06-20 DIAGNOSIS — F17210 Nicotine dependence, cigarettes, uncomplicated: Secondary | ICD-10-CM | POA: Insufficient documentation

## 2024-06-20 DIAGNOSIS — Z79899 Other long term (current) drug therapy: Secondary | ICD-10-CM | POA: Insufficient documentation

## 2024-06-20 DIAGNOSIS — R109 Unspecified abdominal pain: Secondary | ICD-10-CM | POA: Diagnosis not present

## 2024-06-20 DIAGNOSIS — I10 Essential (primary) hypertension: Secondary | ICD-10-CM | POA: Insufficient documentation

## 2024-06-20 LAB — COMPREHENSIVE METABOLIC PANEL WITH GFR
ALT: 8 U/L (ref 0–44)
AST: 19 U/L (ref 15–41)
Albumin: 3 g/dL — ABNORMAL LOW (ref 3.5–5.0)
Alkaline Phosphatase: 91 U/L (ref 38–126)
Anion gap: 13 (ref 5–15)
BUN: 12 mg/dL (ref 8–23)
CO2: 22 mmol/L (ref 22–32)
Calcium: 10.1 mg/dL (ref 8.9–10.3)
Chloride: 92 mmol/L — ABNORMAL LOW (ref 98–111)
Creatinine, Ser: 0.51 mg/dL (ref 0.44–1.00)
GFR, Estimated: >60 mL/min
Glucose, Bld: 88 mg/dL (ref 70–99)
Potassium: 5.4 mmol/L — ABNORMAL HIGH (ref 3.5–5.1)
Sodium: 127 mmol/L — ABNORMAL LOW (ref 135–145)
Total Bilirubin: 0.4 mg/dL (ref 0.0–1.2)
Total Protein: 5.7 g/dL — ABNORMAL LOW (ref 6.5–8.1)

## 2024-06-20 LAB — CBC
HCT: 34.9 % — ABNORMAL LOW (ref 36.0–46.0)
Hemoglobin: 11 g/dL — ABNORMAL LOW (ref 12.0–15.0)
MCH: 27.6 pg (ref 26.0–34.0)
MCHC: 31.5 g/dL (ref 30.0–36.0)
MCV: 87.5 fL (ref 80.0–100.0)
Platelets: 107 K/uL — ABNORMAL LOW (ref 150–400)
RBC: 3.99 MIL/uL (ref 3.87–5.11)
RDW: 16.5 % — ABNORMAL HIGH (ref 11.5–15.5)
WBC: 14.2 K/uL — ABNORMAL HIGH (ref 4.0–10.5)
nRBC: 0 % (ref 0.0–0.2)

## 2024-06-20 LAB — URINALYSIS, ROUTINE W REFLEX MICROSCOPIC
Bilirubin Urine: NEGATIVE
Glucose, UA: NEGATIVE mg/dL
Ketones, ur: 5 mg/dL — AB
Nitrite: NEGATIVE
Protein, ur: 30 mg/dL — AB
RBC / HPF: >50 RBC/hpf (ref 0–5)
Specific Gravity, Urine: 1.011 (ref 1.005–1.030)
WBC, UA: >50 WBC/hpf (ref 0–5)
pH: 8 (ref 5.0–8.0)

## 2024-06-20 MED ORDER — HYDROMORPHONE HCL 1 MG/ML IJ SOLN
0.5000 mg | Freq: Once | INTRAMUSCULAR | Status: AC
  Administered 2024-06-20: 0.5 mg via INTRAVENOUS
  Filled 2024-06-20: qty 1

## 2024-06-20 MED ORDER — HYDROMORPHONE HCL 1 MG/ML IJ SOLN
1.0000 mg | Freq: Once | INTRAMUSCULAR | Status: AC
  Administered 2024-06-20: 1 mg via INTRAVENOUS
  Filled 2024-06-20: qty 1

## 2024-06-20 MED ORDER — SODIUM CHLORIDE 0.9 % IV BOLUS
1000.0000 mL | Freq: Once | INTRAVENOUS | Status: AC
  Administered 2024-06-20: 1000 mL via INTRAVENOUS

## 2024-06-20 MED ORDER — ONDANSETRON HCL 4 MG/2ML IJ SOLN
4.0000 mg | Freq: Once | INTRAMUSCULAR | Status: AC
  Administered 2024-06-20: 4 mg via INTRAVENOUS
  Filled 2024-06-20: qty 2

## 2024-06-20 NOTE — Discharge Instructions (Addendum)
 You were evaluated in the Emergency Department and after careful evaluation, we did not find any emergent condition requiring admission or further testing in the hospital.  Your exam/testing today is overall reassuring.  Follow-up closely with your oncology team as we discussed.  Recommend increasing the nighttime as needed oxycodone  to 7.5 mg if needed as we discussed.  Please return to the Emergency Department if you experience any worsening of your condition.   Thank you for allowing us  to be a part of your care.

## 2024-06-20 NOTE — ED Provider Notes (Signed)
 " [facility] Emergency Department Provider Note MRN:  [id number]  Arrival date & time: 06/20/2024     Chief Complaint   Back Pain   History of Present Illness   Ann Lee is a 69 y.o. year-old female with a history of metastatic cancer presenting to the ED with chief complaint of back pain.  Chronic back pain for the past few months related to metastatic spread of her renal cancer to her spine.  Much worse over the past few hours, denies falls or injuries.  Pain seems to wrap around into the abdomen as well.  No chest pain or shortness of breath, no fever, no numbness or weakness to the arms or legs.  Review of Systems  A thorough review of systems was obtained and all systems are negative except as noted in the HPI and PMH.   Patient's Health History    Past Medical History:  Diagnosis Date   Endometriosis    HERPES ZOSTER 10/18/2008   History of iron deficiency anemia    Hypertension    Bp'a have been running low, holding medication at this time   Migraines    NECK PAIN 02/15/2009   PARESTHESIA 10/30/2009   PERS HX TOBACCO USE PRESENTING HAZARDS HEALTH 03/10/2010   SINUSITIS, ACUTE 05/03/2009   SORE THROAT 02/12/2009   Squamous cell skin cancer    right forearm   TMJ SYNDROME 02/11/2009   Wears dentures     Past Surgical History:  Procedure Laterality Date   CYSTOSCOPY WITH BIOPSY Right 03/01/2024   Procedure: CYSTOSCOPY,;  Surgeon: Devere Lonni Righter, MD;  Location: [facility];  Service: Urology;  Laterality: Right;  CYSTOSCOPY WITH RIGHT URETEROSCOPY, POSSIBLE BIOPSY, RIGHT RETROGRADE PYELOGRAM, STENT PLACEMENT   CYSTOSCOPY WITH URETEROSCOPY AND STENT PLACEMENT Right 03/01/2024   Procedure: CYSTOURETEROSCOPY, WITH STENT INSERTION;  Surgeon: Devere Lonni Righter, MD;  Location: [facility];  Service: Urology;  Laterality: Right;   DILATION AND CURETTAGE OF UTERUS     HERNIA REPAIR     umbilical   IR IMAGING GUIDED PORT INSERTION   03/06/2024   WISDOM TOOTH EXTRACTION      Family History  Problem Relation Age of Onset   Arthritis Mother    Non-Hodgkin's lymphoma Mother    Esophageal cancer Mother    Stroke Mother    Hyperlipidemia Father    Arthritis Father    Colon cancer Father    Lung cancer Sister     Social History   Socioeconomic History   Marital status: Divorced    Spouse name: Not on file   Number of children: 4   Years of education: Not on file   Highest education level: Not on file  Occupational History   Not on file  Tobacco Use   Smoking status: Every Day    Current packs/day: 1.00    Average packs/day: 1 pack/day for 41.0 years (41.0 ttl pk-yrs)    Types: Cigarettes   Smokeless tobacco: Never   Tobacco comments:    She and sister are going to work on quitting together  Vaping Use   Vaping status: Never Used  Substance and Sexual Activity   Alcohol use: No    Alcohol/week: 0.0 standard drinks of alcohol   Drug use: No   Sexual activity: Not on file  Other Topics Concern   Not on file  Social History Narrative   Not on file   Social Drivers of Health   Tobacco Use: High Risk (06/20/2024)  Patient History    Smoking Tobacco Use: Every Day    Smokeless Tobacco Use: Never    Passive Exposure: Not on file  Financial Resource Strain: Low Risk (02/17/2024)   Overall Financial Resource Strain (CARDIA)    Difficulty of Paying Living Expenses: Not very hard  Food Insecurity: No Food Insecurity (05/29/2024)   Epic    Worried About Programme Researcher, Broadcasting/film/video in the Last Year: Never true    Ran Out of Food in the Last Year: Never true  Transportation Needs: No Transportation Needs (05/29/2024)   Epic    Lack of Transportation (Medical): No    Lack of Transportation (Non-Medical): No  Physical Activity: Sufficiently Active (02/17/2024)   Exercise Vital Sign    Days of Exercise per Week: 5 days    Minutes of Exercise per Session: 100 min  Stress: Stress Concern Present (02/17/2024)   [employer] - Occupational Stress Questionnaire    Feeling of Stress: To some extent  Social Connections: Moderately Integrated (02/17/2024)   Social Connection and Isolation Panel    Frequency of Communication with Friends and Family: More than three times a week    Frequency of Social Gatherings with Friends and Family: More than three times a week    Attends Religious Services: More than 4 times per year    Active Member of Clubs or Organizations: Yes    Attends Banker Meetings: More than 4 times per year    Marital Status: Divorced  Intimate Partner Violence: Not At Risk (05/29/2024)   Epic    Fear of Current or Ex-Partner: No    Emotionally Abused: No    Physically Abused: No    Sexually Abused: No  Depression (PHQ2-9): Low Risk (06/01/2024)   Depression (PHQ2-9)    PHQ-2 Score: 3  Alcohol Screen: Low Risk (02/17/2024)   Alcohol Screen    Last Alcohol Screening Score (AUDIT): 0  Housing: Low Risk (05/29/2024)   Epic    Unable to Pay for Housing in the Last Year: No    Number of Times Moved in the Last Year: 0    Homeless in the Last Year: No  Utilities: Not At Risk (05/29/2024)   Epic    Threatened with loss of utilities: No  Health Literacy: Not on file     Physical Exam   Vitals:   06/20/24 0057 06/20/24 0230  BP: 136/89 119/85  Pulse: 82 95  Resp: 16 16  Temp: 97.6 F (36.4 C)   SpO2: 100% 94%    CONSTITUTIONAL: Well-appearing, NAD NEURO/PSYCH:  Alert and oriented x 3, no focal deficits EYES:  eyes equal and reactive ENT/NECK:  no LAD, no JVD CARDIO: Regular rate, well-perfused, normal S1 and S2 PULM:  CTAB no wheezing or rhonchi GI/GU:  non-distended, non-tender MSK/SPINE:  No gross deformities, no edema SKIN:  no rash, atraumatic   *Additional and/or pertinent findings included in MDM below  Diagnostic and Interventional Summary    EKG Interpretation Date/Time:    Ventricular Rate:    PR Interval:    QRS  Duration:    QT Interval:    QTC Calculation:   R Axis:      Text Interpretation:         Labs Reviewed  CBC - Abnormal; Notable for the following components:      Result Value   WBC 14.2 (*)    Hemoglobin 11.0 (*)    HCT 34.9 (*)  RDW 16.5 (*)    Platelets 107 (*)    All other components within normal limits  COMPREHENSIVE METABOLIC PANEL WITH GFR - Abnormal; Notable for the following components:   Sodium 127 (*)    Potassium 5.4 (*)    Chloride 92 (*)    Total Protein 5.7 (*)    Albumin 3.0 (*)    All other components within normal limits  URINALYSIS, ROUTINE W REFLEX MICROSCOPIC - Abnormal; Notable for the following components:   APPearance CLOUDY (*)    Hgb urine dipstick MODERATE (*)    Ketones, ur 5 (*)    Protein, ur 30 (*)    Leukocytes,Ua LARGE (*)    Bacteria, UA RARE (*)    All other components within normal limits    CT ABDOMEN PELVIS WO CONTRAST  Final Result    CT L-SPINE NO CHARGE  Final Result      Medications  HYDROmorphone  (DILAUDID ) injection 0.5 mg (has no administration in time range)  HYDROmorphone  (DILAUDID ) injection 1 mg (1 mg Intravenous Given 06/20/24 0124)  ondansetron  (ZOFRAN ) injection 4 mg (4 mg Intravenous Given 06/20/24 0125)  sodium chloride  0.9 % bolus 1,000 mL (1,000 mLs Intravenous New Bag/Given 06/20/24 0359)     Procedures  /  Critical Care Procedures  ED Course and Medical Decision Making  Initial Impression and Ddx Differential diagnosis includes cancer related pain, pathologic fracture, retroperitoneal hemorrhage.  Pain control, awaiting labs, CT.  Past medical/surgical history that increases complexity of ED encounter: Metastatic cancer  Interpretation of Diagnostics I personally reviewed the Laboratory Testing and my interpretation is as follows: Hyponatremia  CT revealing progression of metastatic disease, no acute findings that require emergent intervention.  Patient Reassessment and Ultimate  Disposition/Management     On reassessment patient is feeling much better.  She would like to go home, she has follow-up with her oncologist in 2 days.  We discussed the option of admission for the hyponatremia and pain management but she is not interested.  She has been having issues eating and has been having a lot of free water instead of food.  She agrees to avoid this, family at bedside agrees to help her boost her sodium levels with better diet.  Appropriate for discharge.  Patient management required discussion with the following services or consulting groups:  None  Complexity of Problems Addressed Acute illness or injury that poses threat of life of bodily function  Additional Data Reviewed and Analyzed Further history obtained from: Further history from spouse/family member  Additional Factors Impacting ED Encounter Risk Consideration of hospitalization  Ozell HERO. Theadore, MD [facility] Emergency Medicine [facility] [email or web address]  Final Clinical Impressions(s) / ED Diagnoses     ICD-10-CM   1. Acute low back pain without sciatica, unspecified back pain laterality  M54.50     2. Metastatic malignant neoplasm, unspecified site (HCC)  C79.9     3. Hyponatremia  E87.1       ED Discharge Orders     None        Discharge Instructions Discussed with and Provided to Patient:     Discharge Instructions      You were evaluated in the Emergency Department and after careful evaluation, we did not find any emergent condition requiring admission or further testing in the hospital.  Your exam/testing today is overall reassuring.  Follow-up closely with your oncology team as we discussed.  Please return to the Emergency Department if you experience any worsening  of your condition.   Thank you for allowing us  to be a part of your care.       Theadore Ozell HERO, MD 06/20/24 0451  "

## 2024-06-20 NOTE — ED Triage Notes (Signed)
 Arrives [facility] from home with severe back pain. Hx of metastatic bone cancer. Took 5mg  oxycodone  prior to ems arrival with no improvement.   EMS administered 100mcg fentanyl  with no improvement as well.

## 2024-06-21 ENCOUNTER — Inpatient Hospital Stay

## 2024-06-21 ENCOUNTER — Encounter: Payer: Self-pay | Admitting: Nurse Practitioner

## 2024-06-21 ENCOUNTER — Inpatient Hospital Stay: Admitting: Nurse Practitioner

## 2024-06-21 ENCOUNTER — Telehealth: Payer: Self-pay | Admitting: *Deleted

## 2024-06-21 ENCOUNTER — Other Ambulatory Visit: Payer: Self-pay | Admitting: *Deleted

## 2024-06-21 ENCOUNTER — Telehealth: Payer: Self-pay

## 2024-06-21 VITALS — BP 118/64 | HR 100 | Temp 98.3°F | Resp 18

## 2024-06-21 DIAGNOSIS — C641 Malignant neoplasm of right kidney, except renal pelvis: Secondary | ICD-10-CM | POA: Diagnosis not present

## 2024-06-21 LAB — CMP (CANCER CENTER ONLY)
ALT: 8 U/L (ref 0–44)
AST: 12 U/L — ABNORMAL LOW (ref 15–41)
Albumin: 3.2 g/dL — ABNORMAL LOW (ref 3.5–5.0)
Alkaline Phosphatase: 101 U/L (ref 38–126)
Anion gap: 15 (ref 5–15)
BUN: 12 mg/dL (ref 8–23)
CO2: 24 mmol/L (ref 22–32)
Calcium: 10.6 mg/dL — ABNORMAL HIGH (ref 8.9–10.3)
Chloride: 89 mmol/L — ABNORMAL LOW (ref 98–111)
Creatinine: 0.52 mg/dL (ref 0.44–1.00)
GFR, Estimated: >60 mL/min
Glucose, Bld: 124 mg/dL — ABNORMAL HIGH (ref 70–99)
Potassium: 4.6 mmol/L (ref 3.5–5.1)
Sodium: 127 mmol/L — ABNORMAL LOW (ref 135–145)
Total Bilirubin: 0.5 mg/dL (ref 0.0–1.2)
Total Protein: 5.9 g/dL — ABNORMAL LOW (ref 6.5–8.1)

## 2024-06-21 LAB — CBC WITH DIFFERENTIAL (CANCER CENTER ONLY)
Abs Immature Granulocytes: 0.08 K/uL — ABNORMAL HIGH (ref 0.00–0.07)
Basophils Absolute: 0 K/uL (ref 0.0–0.1)
Basophils Relative: 0 %
Eosinophils Absolute: 0 K/uL (ref 0.0–0.5)
Eosinophils Relative: 0 %
HCT: 51.4 % — ABNORMAL HIGH (ref 36.0–46.0)
Hemoglobin: 16.7 g/dL — ABNORMAL HIGH (ref 12.0–15.0)
Immature Granulocytes: 1 %
Lymphocytes Relative: 2 %
Lymphs Abs: 0.1 K/uL — ABNORMAL LOW (ref 0.7–4.0)
MCH: 27 pg (ref 26.0–34.0)
MCHC: 32.5 g/dL (ref 30.0–36.0)
MCV: 83 fL (ref 80.0–100.0)
Monocytes Absolute: 0.1 K/uL (ref 0.1–1.0)
Monocytes Relative: 2 %
Neutro Abs: 6.3 K/uL (ref 1.7–7.7)
Neutrophils Relative %: 95 %
Platelet Count: 75 K/uL — ABNORMAL LOW (ref 150–400)
RBC: 6.19 MIL/uL — ABNORMAL HIGH (ref 3.87–5.11)
RDW: 18.3 % — ABNORMAL HIGH (ref 11.5–15.5)
WBC Count: 6.7 K/uL (ref 4.0–10.5)
nRBC: 0 % (ref 0.0–0.2)

## 2024-06-21 MED ORDER — HYDROMORPHONE HCL 2 MG PO TABS: 2.0000 mg | ORAL_TABLET | ORAL | 0 refills | Status: DC | PRN

## 2024-06-21 MED ORDER — SODIUM CHLORIDE 0.9 % IV SOLN: INTRAVENOUS | Status: AC

## 2024-06-21 MED ORDER — LORAZEPAM 0.5 MG PO TABS: 0.5000 mg | ORAL_TABLET | Freq: Four times a day (QID) | ORAL | 0 refills | Status: DC | PRN

## 2024-06-21 NOTE — Telephone Encounter (Signed)
 Copied from CRM [id number]. Topic: Clinical - Medical Advice >> Jun 21, 2024  4:26 PM Kevelyn M wrote: Reason for CRM: Lauren with [facility] calling because they received a hospice referral. Patient would like Dr. Micheal to be her attending. They need to know if he will agree to be her attending and also if he believes that she has 6 months or less to live.  Call back # [phone]

## 2024-06-21 NOTE — Telephone Encounter (Addendum)
 Ann Lee left VM that she has questions about her medication and requests return call asap. Called her back at 0851 and left VM that return call was attempted. Able to speak w/patient at 0932: She informs RN that when she went to ER for pain crisis over the weekend the EMS placed #4 Dur[employer]  patches on her. She received IV hydromorphone  in ER as well. She had pain crisis last night and took some of her previous hydromorphone  2 mg she still had on hand at 1800 and 1245 this am without much improvement in pain. Asking if OK to resume her oxycontin  10 mg every 12 hours and her liquid oxycodone ? She reports that her pain tends to worsen in the evening and needs help with this. Confirmed she is coming in today for her scheduled appointment.

## 2024-06-21 NOTE — Progress Notes (Signed)
 " [facility] OFFICE PROGRESS NOTE   Diagnosis: Urothelial carcinoma  INTERVAL HISTORY:   Ann Lee returns as scheduled.  She is seen prior to proceeding with cycle 1 cisplatin/gemcitabine 06/22/2024.  Treatment has been on hold due to poor performance status.  Dr. Cloretta has recommended hospice care.  She received IV fluids on 06/16/2024.  She was evaluated in the emergency department yesterday for worsening back pain.  CTs confirmed progressive disease.  She is accompanied by her son and sister.  They report her overall condition continues to decline.  Very poor oral intake.  She estimates she is up/moving around the house about an hour a day.  She received IV Dilaudid  1 mg in the emergency department yesterday and noted improvement in pain lasting about 3 to 4 hours.  Her pain gets significantly worse at nighttime between the hours of 11 PM and 2 AM.  Last night she took a single dose of Dilaudid  2 mg without relief.  Last bowel movement was about a week ago.  She is afraid to take anything for constipation.  Objective:  Vital signs in last 24 hours:  Blood pressure 118/64, pulse 100, temperature 98.3 F (36.8 C), resp. rate 18, SpO2 100%.   Chronically ill-appearing female who appears uncomfortable due to pain.  Resp: Lungs clear bilaterally. Cardio: Regular rate and rhythm. GI: Abdomen is soft. Vascular: No leg edema. Neuro: Alert and oriented. Port-A-Cath without erythema.  Lab Results:  Lab Results  Component Value Date   WBC 14.2 (H) 06/20/2024   HGB 11.0 (L) 06/20/2024   HCT 34.9 (L) 06/20/2024   MCV 87.5 06/20/2024   PLT 107 (L) 06/20/2024   NEUTROABS 2.9 06/12/2024    Imaging:  CT L-SPINE NO CHARGE Result Date: 06/20/2024 EXAM: CT OF THE LUMBAR SPINE WITHOUT CONTRAST 06/20/2024 02:26:05 AM TECHNIQUE: CT of the lumbar spine was performed without the administration of intravenous contrast. Multiplanar reformatted images are provided for review.  Automated exposure control, iterative reconstruction, and/or weight based adjustment of the mA/kV was utilized to reduce the radiation dose to as low as reasonably achievable. COMPARISON: 03/13/2024. CLINICAL HISTORY: FINDINGS: BONES AND ALIGNMENT: Numerous lytic and sclerotic lesions throughout the lumbar spine, worsening since prior study. Destructive lytic masses within the right side of the sacrum have enlarged since the prior study. Lytic destructive lesion in the posterior left 12th rib lesion is new since prior study. Normal alignment. DEGENERATIVE CHANGES: No significant degenerative changes. SOFT TISSUES: Small bilateral pleural effusions partially visualized. Retrocrural and retroperitoneal adenopathy have worsened. IMPRESSION: 1. Numerous lytic and sclerotic lesions throughout the lumbar spine, worsening since prior study. 2. Destructive lytic masses within the right side of the sacrum have enlarged since the prior study. 3. Lytic destructive lesion in the posterior left 12th rib, new since prior study. 4. Retrocrural and retroperitoneal adenopathy, worsened since prior study. 5. Small bilateral pleural effusions. Electronically signed by: Franky Crease MD 06/20/2024 02:49 AM EST RP Workstation: [id number]   CT ABDOMEN PELVIS WO CONTRAST Result Date: 06/20/2024 EXAM: CT ABDOMEN AND PELVIS WITHOUT CONTRAST 06/20/2024 02:26:05 AM TECHNIQUE: CT of the abdomen and pelvis was performed without the administration of intravenous contrast. Multiplanar reformatted images are provided for review. Automated exposure control, iterative reconstruction, and/or weight-based adjustment of the mA/kV was utilized to reduce the radiation dose to as low as reasonably achievable. COMPARISON: 05/04/2024 CLINICAL HISTORY: Abdominal pain, acute, nonlocalized. FINDINGS: LOWER CHEST: Small to moderate bilateral pleural effusions. LIVER: The liver is unremarkable. GALLBLADDER AND BILE  DUCTS: Gallbladder is unremarkable. No biliary  ductal dilatation. SPLEEN: No acute abnormality. PANCREAS: No acute abnormality. ADRENAL GLANDS: No acute abnormality. KIDNEYS, URETERS AND BLADDER: Right ureteral stent in place, unchanged. Moderate hydronephrosis is stable. Bilateral nephrolithiasis. No perinephric or periureteral stranding. Urinary bladder is unremarkable. GI AND BOWEL: Stomach demonstrates no acute abnormality. There is no bowel obstruction. Normal appendix. PERITONEUM AND RETROPERITONEUM: No ascites. No free air. VASCULATURE: Aorta is normal in caliber. LYMPH NODES: Bilateral retrocrural adenopathy, the largest node on the left with a short axial diameter of 1.2 cm compared to 7 mm previously. Retroperitoneal adenopathy, with a left periaortic nodal mass measuring up to 2.6 cm on image 19 compared to 2.3 cm previously. The right periaortic nodal mass has a short axis diameter of 1.4 cm on image 20, not seen previously. Numerous other retroperitoneal lymph nodes have enlarged. REPRODUCTIVE ORGANS: No acute abnormality. BONES AND SOFT TISSUES: Worsening lytic right iliac bone lesion measuring up to 3.3 cm compared to 1.5 cm previously. Worsening of the right sacral lytic lesion measuring up to 4.6 cm compared to 3.4 cm previously. Worsening of the inferior right sacral lytic lesion measuring up to 3.8 cm, not measurable on prior study. New lytic lesion in the posterior left 12th rib measuring up to 2.1 cm. Numerous lumbar spine lytic lesions are new or enlarging since prior study. Right acetabulum lytic lesion measures 3.8 cm compared to 2.5 cm previously. Enlarging left inferior pubic ramus lytic lesion. No focal soft tissue abnormality. IMPRESSION: 1. Stable moderate hydronephrosis with right ureteral stent in place. 2. Progressive osseous metastatic disease with multiple new or enlarging lytic lesions. 3. Progressive abdominopelvic lymphadenopathy, including enlarging retroperitoneal periaortic nodal masses and bilateral retrocrural adenopathy  . 4. Right ureteral stent remains in place with stable right hydronephrosis. 5. Bilateral nephrolithiasis. 6. Small to moderate bilateral pleural effusions, new since prior study. Electronically signed by: Franky Crease MD 06/20/2024 02:47 AM EST RP Workstation: [id number]    Medications: I have reviewed the patient's current medications.  Assessment/Plan: Right renal mass CTs abdomen/pelvis 02/01/2024-mild right-sided hydroureteronephrosis extending to the mid to distal ureter; multiple enlarged retroperitoneal lymph nodes involving the periaortic and aortocaval spaces; ovoid hyperdensity along the lower pole of the right kidney measuring 1.8 cm.   CT renal 02/08/2024-1.6 x 1.7 x 2.0 cm hyperattenuating moderately enhancing mass in the right kidney lower pole calyx with subtle extension of tumor into the anteroinferior wall of the right renal pelvis; moderate right hydronephrosis and hydroureter up to the level of the right mid ureter; additional linear bandlike enhancing area in the right upper ureter; extensive heterogeneous retroperitoneal lymphadenopathy.   PET scan 02/11/2024-hypermetabolic left supraclavicular, AP window, retrocrural, right gastric and retroperitoneal adenopathy; solitary hypermetabolic skeletal lesion in the vicinity of the right anterior S1 sacral foramen. Biopsy left supraclavicular lymph node 02/24/2024-metastatic carcinoma compatible with clinical suspicion of metastatic urothelial carcinoma; foundation 1-microsatellite stable, HRDsig negative, tumor mutation burden 1, negative for FGFR3 alteration Cystoscopy/right ureter stent placement 03/01/2024-No intravesical or urethral abnormality seen; solitary right collecting system with no filling defects or dilation involving the right ureter or right renal pelvis; papillary tumor involving the right mid pole calyx with features concerning for urothelial carcinoma.  Renal pelvis washings: High-grade urothelial carcinoma, HER2 3+, mismatch  repair protein expression intact Cycle 1 day 1 pembrolizumab  and enfortumab 03/09/2024; day 8 held 03/16/2024 due to rash and diarrhea CT chest 03/13/2024-negative for PE; progressive adenopathy in the chest and upper abdomen CT lumbar spine 03/13/2024-known S1 metastasis only  faintly visualized.  Loss of cortex at the S1 foramen with possible soft tissue component, possibly abutting the subjacent nerve root.  Upper retroperitoneal adenopathy again seen, progressive. 03/16/2024 cycle 1 day 8 enfortumab held due to diarrhea and rash 03/29/2024 cycle 2-day 1 Pembrolizumab /enfortumab, enfortumab dose reduced due to rash and diarrhea following cycle 1 day 1, day 8 enfortumab 04/06/2024 04/20/2024 cycle 3-day 1 pembrolizumab /enfortumab; day 8 held due to failure to thrive CTs 05/04/2024 report reads progression of nodal and osseous metastasis.  Per Dr. Andriette review no significant change in chest lymphadenopathy, mixed response involving retroperitoneal nodes, sacral lesion appears enlarged, new sclerotic lesions in the thoracic spine may be related to scarring from treated metastases. Mutual decision to continue Pembrolizumab  and refer for second opinion at [facility] Recommend change to second split dose line cisplatin/gemcitabine day 1, 8 q21 days starting ~06/05/24 06/09/2024 CT head-multiple calvarial metastasis Back pain secondary to #1 Palliative radiation to R pelvis/SI per Dr. Maritza 12/17 - 06/05/24 Oxycodone , medrol  dose pack, gabapentin , and to pick up oxycontin  today Change in bowel habits, postprandial abdominal pain Emphysema Tobacco use Rash and diarrhea following cycle 1 day 1 Pembrolizumab /enfortumab N/V, GERD, low appetite      Disposition: Ann Lee has metastatic urothelial carcinoma.  She is scheduled to begin treatment tomorrow with cisplatin/gemcitabine.  Her overall condition continues to decline.  She has an extremely poor performance status.  She  understands she is not a candidate for chemotherapy in her condition.  We reviewed Dr. Andriette previous recommendation for hospice care.  Ann Lee, her son and sister are all in agreement with home hospice care.  She confirms No Code Blue status.  Oxycodone  has not been effectively relieving her pain.  She noted some relief of pain after receiving Dilaudid  1 mg IV yesterday in the emergency department.  I sent a new prescription to her pharmacy for Dilaudid  2 to 4 mg oral every 4 hours as needed.  She also requests a refill on Ativan .  For the constipation she will begin daily MiraLAX.  Referral placed to [facility] hospice.  She is scheduled to return for follow-up on 06/27/2024.  She would like to keep this appointment in place for now.    Olam Ned ANP/GNP-BC   06/21/2024  3:10 PM        "

## 2024-06-21 NOTE — Progress Notes (Signed)
 Patient reports pain 10/10 now. Son just gave her the 5 cc of oxycodone  solution. She reports pain tends to worsen in the evening. She refused to allow NP to weigh her (even sitting in w/c) and admits to drinking ~ 20 oz fluid/day. Hospice referral placed and called to confirm receipt. Are working on it now to attempt a visit tomorrow. Sister provided copy of her HealthCare POA. Sent to HIM to scan. Called [facility] and left VM for Dr. Maralyn nurse, Patty requesting a video/telephone visit to assess Ann Lee for how to proceed regarding her stent exchange and noted that she was having a lot pain as well.

## 2024-06-21 NOTE — Patient Instructions (Signed)
 Fluids Given Through an IV (IV Infusion Therapy): What to Expect IV infusion therapy is a treatment to deliver a fluid, called an infusion, into a vein. You may have IV infusion to get: Fluids. Medicines. Nutrition. Chemotherapy. This is medicines to stop or slow down cancer cells. Blood or blood products. Dye that is given before an MRI or a CT scan. This is called contrast dye. Tell a health care provider about: Any allergies you have. This includes allergies to anesthesia or dyes. All medicines you take. These include vitamins, herbs, eye drops, and creams. Any bleeding problems you have. Any surgeries you've had, including if you've had lymph nodes taken out of your armpit or if you have a arteriovenous fistula for dialysis. Any medical problems you have. Whether you're pregnant or may be pregnant. Whether you've used IV drugs. What are the risks? Your health care provider will talk with you about risks. These may include: Pain, bruising, or bleeding. Infection. The IV leaking or moving out of place. Damage to blood vessels or nerves. Allergic reactions to medicines or dyes. A blood clot. An air bubble in the vein, also called an air embolism. What happens before the procedure? Eat and drink only as you've been told. Ask about changing or stopping: Any medicines you take. Any vitamins, herbs, or supplements you take. What happens during the procedure?     Placing the catheter Your skin at the IV site will be washed with fluid that kills germs. This will help prevent infection. IV infusion therapy starts with a procedure to place a soft tube called a catheter into a vein. An IV tube will be attached to the catheter to let the infusion flow into your blood. Your catheter may be placed: Into a vein that is usually in the bend of the elbow, forearm, or back of the hand. This is called a peripheral IV catheter. This may need to be put into a vein each time you get an  infusion. Into a vein near your elbow. This is called a midline catheter or a peripherally inserted central catheter (PICC). These types of catheters may stay in place for weeks or months at a time so you can receive repeated infusions through it. Into a vein near your neck that leads to your heart. This is called a non-tunneled catheter. This is only used for short amounts of time because it can cause infection. Through the skin of your chest and into a large vein that leads to your heart. This is called a tunneled catheter. This may stay in your body for months or years. Into an implanted port. An implanted port is a device that is surgically inserted under the skin of the chest to provide long-term IV access. The catheter will connect the port to a large vein in the chest or upper arm. A port may be kept in place for many months or years. Each time you have an infusion, a needle will be inserted through your skin to connect the catheter to the port. Doing the infusion To start the infusion, your provider will: Attach the IV tubing to your catheter. Use a tape or a bandage to hold the IV in place against your skin. An IV pump may be used to control the flow of the IV infusion. During the infusion, your provider will check the area to make sure: There is no bleeding, swelling, or pain. Your IV infusion is flowing correctly. After the infusion, your provider will: Take off the bandage  or tape. Disconnect the tubing from the catheter. Remove the catheter, if you have a peripheral IV. Apply pressure over the IV insertion site to stop bleeding, then cover the area with a bandage. If you have an implanted port, PICC, non-tunneled, or tunneled catheter, your catheter may remain in place. This depends on how many times you will need treatment, your medical condition, and what type of catheter you have. These steps may vary. Ask what you can expect. What can I expect after the procedure? You may be  watched closely until you leave. This includes checking your pain level, blood pressure, heart rate, and breathing rate. Your provider will check to make sure there are no signs of infection. Follow these instructions at home: Take your medicines only as told. Change or take off your bandage as told by your provider. Ask what things are safe for you to do at home. Ask when you can go back to work or school. Do not take baths, swim, or use a hot tub until you're told it's OK. Ask if you can shower. Check your IV insertion site every day for signs of infection. Check for: Redness, swelling, or pain. Fluid or blood. If fluid or blood drains from your IV site, use your hands to press down firmly on the area for a minute or two. Doing this should stop the bleeding. Warmth. Pus or a bad smell. Contact a health care provider if: You have signs of infection around your IV site. You have fluid or blood coming from your IV site that does not stop after you put pressure to the site. You have a rash or blisters. You have itchy, red, swollen areas of skin called hives. Get help right away if: You have a fever or chills. You have chest pain. You have trouble breathing. This information is not intended to replace advice given to you by your health care provider. Make sure you discuss any questions you have with your health care provider. Document Revised: 11/24/2022 Document Reviewed: 11/24/2022 Elsevier Patient Education  2024 [employer].

## 2024-06-22 ENCOUNTER — Ambulatory Visit: Admitting: Audiology

## 2024-06-22 ENCOUNTER — Other Ambulatory Visit: Payer: Self-pay | Admitting: *Deleted

## 2024-06-22 ENCOUNTER — Inpatient Hospital Stay

## 2024-06-22 ENCOUNTER — Telehealth: Payer: Self-pay | Admitting: *Deleted

## 2024-06-22 NOTE — Telephone Encounter (Signed)
 I left detailed voicemail informing Lauren of the message below and to call back with any questions.

## 2024-06-22 NOTE — Telephone Encounter (Signed)
 Was able to speak w/Hospice nurse who is with patient now for admission visit. Informed her that corrected calcium from yesterday returned at 10.9 and NP feels this could be contributing to some of her symptoms. We are suggesting 1 liter NS and dose of Zometa  to see if it can be corrected. Patient would like to pursue this tomorrow, but nurse needs to speak with medical director to determine if Hospice can cover this.

## 2024-06-22 NOTE — Telephone Encounter (Signed)
 Notified Ms. Moser to come tomorrow at 1045 for lab/IVF and Zometa . Notified managed care to check for PA

## 2024-06-22 NOTE — Telephone Encounter (Signed)
 Call from Dr. Maralyn nurse, Patty that they are arranging a telehealth visit with Ms. Tagliaferri to discuss her status and need for stent exchange.

## 2024-06-23 ENCOUNTER — Telehealth: Payer: Self-pay | Admitting: Oncology

## 2024-06-23 ENCOUNTER — Inpatient Hospital Stay

## 2024-06-23 ENCOUNTER — Other Ambulatory Visit: Payer: Self-pay | Admitting: *Deleted

## 2024-06-23 ENCOUNTER — Other Ambulatory Visit (HOSPITAL_BASED_OUTPATIENT_CLINIC_OR_DEPARTMENT_OTHER): Payer: Self-pay

## 2024-06-23 ENCOUNTER — Encounter: Payer: Self-pay | Admitting: Oncology

## 2024-06-23 ENCOUNTER — Other Ambulatory Visit: Payer: Self-pay | Admitting: Nurse Practitioner

## 2024-06-23 DIAGNOSIS — C641 Malignant neoplasm of right kidney, except renal pelvis: Secondary | ICD-10-CM | POA: Diagnosis not present

## 2024-06-23 LAB — CMP (CANCER CENTER ONLY)
ALT: 6 U/L (ref 0–44)
AST: <10 U/L — ABNORMAL LOW (ref 15–41)
Albumin: 2.8 g/dL — ABNORMAL LOW (ref 3.5–5.0)
Alkaline Phosphatase: 81 U/L (ref 38–126)
Anion gap: 10 (ref 5–15)
BUN: 13 mg/dL (ref 8–23)
CO2: 25 mmol/L (ref 22–32)
Calcium: 9.6 mg/dL (ref 8.9–10.3)
Chloride: 93 mmol/L — ABNORMAL LOW (ref 98–111)
Creatinine: 0.46 mg/dL (ref 0.44–1.00)
GFR, Estimated: >60 mL/min
Glucose, Bld: 94 mg/dL (ref 70–99)
Potassium: 4.5 mmol/L (ref 3.5–5.1)
Sodium: 128 mmol/L — ABNORMAL LOW (ref 135–145)
Total Bilirubin: 0.4 mg/dL (ref 0.0–1.2)
Total Protein: 4.9 g/dL — ABNORMAL LOW (ref 6.5–8.1)

## 2024-06-23 MED ORDER — SORBITOL 70 % SOLN
15.0000 mL | 2 refills | Status: DC
  Filled 2024-06-23: qty 473, 8d supply, fill #0

## 2024-06-23 MED ORDER — SODIUM CHLORIDE 0.9 % IV SOLN: INTRAVENOUS | Status: DC

## 2024-06-23 MED ORDER — ZOLEDRONIC ACID 4 MG/100ML IV SOLN
4.0000 mg | Freq: Once | INTRAVENOUS | Status: DC
  Filled 2024-06-23: qty 100

## 2024-06-23 NOTE — Telephone Encounter (Signed)
 PT  called to see if she would get a bed during txt today; spoke with Nurses and confirmed.

## 2024-06-23 NOTE — Progress Notes (Addendum)
 Pt here for IVF, lab and Zometa . See port access notes- blood return noted but not enough to drawback for labs despite multiple attempts, repositioning and flushes. Pt tastes the saline and port flushes easily. Notified Ann BROCKS, RN for provider. Phlebotomist will draw the labs and Ann Ned, NP wants to wait for results before giving the Zometa .   Pt c/o constipation and no BM for 12 days. Ann sent in a rx for sorbitol . Pt and family made aware.   Notified provider of pt's weight (86.6 lbs down from 99lbs three weeks ago) & calcium today is 9.6.Ann Lee and Ann Ned, NP came to see pt after IVF complete.No Zometa  today as calcium is normal.     Addendum 3:28 PM-Ann Lee is accompanied by her son and sister.  They report improvement in pain control with the higher dose of Dilaudid .  We reviewed the calcium level from today, 9.6, corrected 10.8.  We discussed Zometa  and potential side effects.  Mutual decision to not proceed with Zometa .  We reconfirmed her decision for hospice care.  She will return for an office visit as scheduled next week.  LT/GBS

## 2024-06-23 NOTE — Progress Notes (Signed)
 Patient reports no BM in 12-14 days. Per NP: Call in sorbitol  15 ml every 6 hours till BM, then 15 ml daily as needed for constipation.

## 2024-06-23 NOTE — Patient Instructions (Signed)

## 2024-06-24 ENCOUNTER — Other Ambulatory Visit: Payer: Self-pay | Admitting: Oncology

## 2024-06-24 ENCOUNTER — Inpatient Hospital Stay

## 2024-06-27 ENCOUNTER — Inpatient Hospital Stay

## 2024-06-27 ENCOUNTER — Telehealth: Payer: Self-pay | Admitting: Oncology

## 2024-06-27 ENCOUNTER — Inpatient Hospital Stay: Admitting: Nurse Practitioner

## 2024-06-27 NOTE — Telephone Encounter (Signed)
 Called PT's son to check on patient, PT's son stated that PT is not feeling well this morning and would not be able to make it in to appts. PT's son will call back after lunch to reschedule missed appts.

## 2024-06-28 ENCOUNTER — Inpatient Hospital Stay

## 2024-06-28 ENCOUNTER — Telehealth: Payer: Self-pay | Admitting: Nurse Practitioner

## 2024-06-29 ENCOUNTER — Telehealth: Payer: Self-pay | Admitting: *Deleted

## 2024-06-29 ENCOUNTER — Other Ambulatory Visit: Payer: Self-pay | Admitting: Nurse Practitioner

## 2024-06-29 DIAGNOSIS — C641 Malignant neoplasm of right kidney, except renal pelvis: Secondary | ICD-10-CM

## 2024-06-29 NOTE — Telephone Encounter (Signed)
 Called Debby, son, and left message regarding if Hospice had been called yesterday for patient's comfort care issues, left number to call back

## 2024-06-29 NOTE — Progress Notes (Signed)
 Called and spoke with Ann Lee son Ann Lee regarding his earlier phone call.  Hospice has made several home visits.  She is now taking Dilaudid  4 mg tabs as needed.  Pain seems to be controlled but he is concerned about the sedation and would prefer to have 2 mg tablets to give her during the day.  He reports the pills are too small to split.  He remains concerned about her hydration status and notes that she is weak.  We reviewed supportive/comfort care measures and he indicates understanding.  He feels the communication with hospice has not been optimal.  We discussed the possibility of a hospice physician coming to their home.  He would be very appreciative if this could be arranged for.  We will contact hospice with this request.

## 2024-06-30 ENCOUNTER — Encounter: Payer: Self-pay | Admitting: *Deleted

## 2024-06-30 ENCOUNTER — Inpatient Hospital Stay

## 2024-06-30 NOTE — Progress Notes (Signed)
 Contacted [facility] this am to discuss Hospice provider visit to home.

## 2024-07-12 ENCOUNTER — Inpatient Hospital Stay: Admitting: Nurse Practitioner

## 2024-07-12 ENCOUNTER — Inpatient Hospital Stay

## 2024-07-13 ENCOUNTER — Inpatient Hospital Stay

## 2024-07-15 ENCOUNTER — Inpatient Hospital Stay

## 2024-07-16 DEATH — deceased
